# Patient Record
Sex: Female | Born: 1945 | Race: White | Hispanic: No | Smoking: Never smoker
Health system: Southern US, Community
[De-identification: ages and names within clinical notes are randomized; demographics above are authoritative.]

## PROBLEM LIST (undated history)

## (undated) DIAGNOSIS — I10 Essential (primary) hypertension: Secondary | ICD-10-CM

## (undated) DIAGNOSIS — K219 Gastro-esophageal reflux disease without esophagitis: Secondary | ICD-10-CM

## (undated) DIAGNOSIS — IMO0002 Reserved for concepts with insufficient information to code with codable children: Secondary | ICD-10-CM

## (undated) DIAGNOSIS — G4730 Sleep apnea, unspecified: Secondary | ICD-10-CM

## (undated) DIAGNOSIS — I4891 Unspecified atrial fibrillation: Secondary | ICD-10-CM

## (undated) DIAGNOSIS — I509 Heart failure, unspecified: Secondary | ICD-10-CM

## (undated) DIAGNOSIS — E119 Type 2 diabetes mellitus without complications: Secondary | ICD-10-CM

## (undated) DIAGNOSIS — N319 Neuromuscular dysfunction of bladder, unspecified: Secondary | ICD-10-CM

## (undated) DIAGNOSIS — E785 Hyperlipidemia, unspecified: Secondary | ICD-10-CM

## (undated) DIAGNOSIS — R0609 Other forms of dyspnea: Secondary | ICD-10-CM

## (undated) DIAGNOSIS — I2699 Other pulmonary embolism without acute cor pulmonale: Secondary | ICD-10-CM

## (undated) DIAGNOSIS — E669 Obesity, unspecified: Secondary | ICD-10-CM

## (undated) HISTORY — DX: Essential (primary) hypertension: I10

## (undated) HISTORY — PX: OTHER SURGICAL HISTORY: SHX169

## (undated) HISTORY — PX: TOTAL ABDOMINAL HYSTERECTOMY W/ BILATERAL SALPINGOOPHORECTOMY: SHX83

## (undated) HISTORY — DX: Gastro-esophageal reflux disease without esophagitis: K21.9

## (undated) HISTORY — DX: Other pulmonary embolism without acute cor pulmonale: I26.99

## (undated) HISTORY — DX: Type 2 diabetes mellitus without complications: E11.9

## (undated) HISTORY — DX: Obesity, unspecified: E66.9

## (undated) HISTORY — DX: Hyperlipidemia, unspecified: E78.5

## (undated) HISTORY — DX: Sleep apnea, unspecified: G47.30

## (undated) HISTORY — PX: TONSILLECTOMY: SHX5217

## (undated) HISTORY — DX: Neuromuscular dysfunction of bladder, unspecified: N31.9

## (undated) HISTORY — DX: Reserved for concepts with insufficient information to code with codable children: IMO0002

## (undated) HISTORY — DX: Unspecified atrial fibrillation: I48.91

## (undated) HISTORY — DX: Heart failure, unspecified: I50.9

## (undated) HISTORY — DX: Other forms of dyspnea: R06.09

---

## 2000-04-26 ENCOUNTER — Ambulatory Visit (HOSPITAL_BASED_OUTPATIENT_CLINIC_OR_DEPARTMENT_OTHER): Admission: RE | Admit: 2000-04-26 | Discharge: 2000-04-26 | Payer: Self-pay | Admitting: Otolaryngology

## 2000-06-02 ENCOUNTER — Other Ambulatory Visit: Admission: RE | Admit: 2000-06-02 | Discharge: 2000-06-02 | Payer: Self-pay | Admitting: Internal Medicine

## 2000-06-02 ENCOUNTER — Encounter (INDEPENDENT_AMBULATORY_CARE_PROVIDER_SITE_OTHER): Payer: Self-pay | Admitting: Specialist

## 2000-10-08 ENCOUNTER — Ambulatory Visit (HOSPITAL_COMMUNITY): Admission: RE | Admit: 2000-10-08 | Discharge: 2000-10-08 | Payer: Self-pay | Admitting: Family Medicine

## 2000-10-08 ENCOUNTER — Encounter: Payer: Self-pay | Admitting: Family Medicine

## 2001-03-04 ENCOUNTER — Ambulatory Visit (HOSPITAL_COMMUNITY): Admission: RE | Admit: 2001-03-04 | Discharge: 2001-03-04 | Payer: Self-pay | Admitting: General Surgery

## 2001-03-04 ENCOUNTER — Encounter: Payer: Self-pay | Admitting: General Surgery

## 2001-03-17 ENCOUNTER — Encounter: Payer: Self-pay | Admitting: General Surgery

## 2001-03-17 ENCOUNTER — Ambulatory Visit (HOSPITAL_COMMUNITY): Admission: RE | Admit: 2001-03-17 | Discharge: 2001-03-17 | Payer: Self-pay

## 2002-03-08 ENCOUNTER — Ambulatory Visit (HOSPITAL_COMMUNITY): Admission: RE | Admit: 2002-03-08 | Discharge: 2002-03-08 | Payer: Self-pay | Admitting: General Surgery

## 2002-03-08 ENCOUNTER — Encounter: Payer: Self-pay | Admitting: General Surgery

## 2003-03-14 ENCOUNTER — Encounter: Payer: Self-pay | Admitting: General Surgery

## 2003-03-14 ENCOUNTER — Ambulatory Visit (HOSPITAL_COMMUNITY): Admission: RE | Admit: 2003-03-14 | Discharge: 2003-03-14 | Payer: Self-pay | Admitting: General Surgery

## 2003-03-29 ENCOUNTER — Encounter: Payer: Self-pay | Admitting: General Surgery

## 2003-03-29 ENCOUNTER — Ambulatory Visit (HOSPITAL_COMMUNITY): Admission: RE | Admit: 2003-03-29 | Discharge: 2003-03-29 | Payer: Self-pay | Admitting: General Surgery

## 2004-03-15 ENCOUNTER — Ambulatory Visit (HOSPITAL_COMMUNITY): Admission: RE | Admit: 2004-03-15 | Discharge: 2004-03-15 | Payer: Self-pay | Admitting: General Surgery

## 2005-03-19 ENCOUNTER — Ambulatory Visit (HOSPITAL_COMMUNITY): Admission: RE | Admit: 2005-03-19 | Discharge: 2005-03-19 | Payer: Self-pay | Admitting: General Surgery

## 2005-07-16 ENCOUNTER — Ambulatory Visit: Payer: Self-pay | Admitting: Cardiology

## 2006-01-16 ENCOUNTER — Ambulatory Visit: Payer: Self-pay | Admitting: Internal Medicine

## 2006-01-21 ENCOUNTER — Ambulatory Visit: Payer: Self-pay | Admitting: Cardiology

## 2006-01-31 ENCOUNTER — Ambulatory Visit: Payer: Self-pay | Admitting: Cardiology

## 2006-02-11 ENCOUNTER — Ambulatory Visit: Payer: Self-pay | Admitting: Cardiology

## 2006-02-18 ENCOUNTER — Ambulatory Visit: Payer: Self-pay | Admitting: Internal Medicine

## 2006-03-25 ENCOUNTER — Ambulatory Visit (HOSPITAL_COMMUNITY): Admission: RE | Admit: 2006-03-25 | Discharge: 2006-03-25 | Payer: Self-pay | Admitting: General Surgery

## 2007-02-12 ENCOUNTER — Encounter: Admission: RE | Admit: 2007-02-12 | Discharge: 2007-03-09 | Payer: Self-pay | Admitting: Family Medicine

## 2007-03-23 ENCOUNTER — Ambulatory Visit: Payer: Self-pay | Admitting: Internal Medicine

## 2007-03-23 ENCOUNTER — Observation Stay (HOSPITAL_COMMUNITY): Admission: EM | Admit: 2007-03-23 | Discharge: 2007-03-25 | Payer: Self-pay | Admitting: Emergency Medicine

## 2007-03-31 ENCOUNTER — Ambulatory Visit: Payer: Self-pay | Admitting: Internal Medicine

## 2007-04-01 ENCOUNTER — Ambulatory Visit (HOSPITAL_COMMUNITY): Admission: RE | Admit: 2007-04-01 | Discharge: 2007-04-01 | Payer: Self-pay | Admitting: General Surgery

## 2007-04-30 ENCOUNTER — Ambulatory Visit (HOSPITAL_COMMUNITY): Admission: RE | Admit: 2007-04-30 | Discharge: 2007-05-01 | Payer: Self-pay | Admitting: General Surgery

## 2007-04-30 ENCOUNTER — Encounter (INDEPENDENT_AMBULATORY_CARE_PROVIDER_SITE_OTHER): Payer: Self-pay | Admitting: General Surgery

## 2008-04-05 ENCOUNTER — Ambulatory Visit (HOSPITAL_COMMUNITY): Admission: RE | Admit: 2008-04-05 | Discharge: 2008-04-05 | Payer: Self-pay | Admitting: General Surgery

## 2009-04-07 ENCOUNTER — Ambulatory Visit (HOSPITAL_COMMUNITY): Admission: RE | Admit: 2009-04-07 | Discharge: 2009-04-07 | Payer: Self-pay | Admitting: General Surgery

## 2010-04-09 ENCOUNTER — Ambulatory Visit (HOSPITAL_COMMUNITY): Admission: RE | Admit: 2010-04-09 | Discharge: 2010-04-09 | Payer: Self-pay | Admitting: General Surgery

## 2011-03-05 NOTE — Discharge Summary (Signed)
Kathleen [name], Kathleen [name]                 ACCOUNT NO.:  [id number]   MEDICAL RECORD NO.:  [id number]          PATIENT TYPE:  OBV   LOCATION:  6526                         FACILITY:  [facility]   PHYSICIAN:  Jonelle Sidle, MD DATE OF BIRTH:  [birth year]   DATE OF ADMISSION:  03/23/2007  DATE OF DISCHARGE:  03/25/2007                               DISCHARGE SUMMARY   PRIMARY CARDIOLOGIST:  Dr. Andee Lineman.   PRIMARY CARE PHYSICIAN:  Dr. Rudi Heap.   PROCEDURES PERFORMED DURING HOSPITALIZATION:  Abdominal ultrasound.   DISCHARGE DIAGNOSES:  1. Cholelithiasis with epigastric pain.  2. History of dysphagia.  3. Gastroesophageal reflux disease  4. Hypertension.  5. Hyperlipidemia.  6. Morbid obesity.   HISTORY OF PRESENT ILLNESS:  This is a 65 year old, morbidly obese,  Caucasian female with a history of chest pain with a negative Myoview in  April of 2007, who the morning of admission, approximately an hour and a  half after eating a bologna sandwich for breakfast, she developed 8/10  retrosternal epigastric discomfort with squeezing in the mid and mild  associated nausea, worse with deep breathing and palpitation of the  epigastrium without any associated symptoms.  After 3 hours of these  continuing symptoms, she presented to her primary care physician's  office where she was given sublingual nitroglycerin without any  improvement in symptoms.  She was given a GI cocktail and there was  complete relief.  Her symptoms were reproducible, but with palpation in  the epigastric area.  EMS was called and the patient was taken via  ambulance to [facility] Emergency Department and we were asked to see  the patient for chest pain.  She was seen and examined by Ward Givens,  nurse practitioner and Dr. Dietrich Pates.  Her EKG showed an incomplete  right bundle branch block with nonspecific T wave changes.  The  patient's vital signs were found to be stable.  Chest x-ray showed no  active disease.  EKG  showed sinus rhythm with a rate of 87 beats per  minute and normal axis.  There was T wave inversion noted in V1-V3.  The  patient was admitted to rule out myocardial infarction or cardiac  etiology for chest discomfort.   The patient's point of care markers were found to be mildly elevated  with a CK-MB elevated at 7.5, but her troponin was normal.  The  patient's cardiac enzymes were cycled throughout hospitalization and  were found to be negative with a troponin of 0.02 and 0.03 respectively.  CK was 181 and 252 respectively.  CK-MB 5.9 and 3.7 respectively.  Patient had not further complaints of chest discomfort after admission  and was noted to have abnormal liver function tests.  She was seen in  consultation by Dr. Stan Head, GI specialist.  An abdominal  ultrasound noted gallstones, but no evidence of inflammation or  obstruction.  She also had fatty lvier changes.  She was symptomatically  stable and was ultimately noted to have decreasing liver enzymes and was  able to eat.  As a result of this, Dr. Leone Payor  did speak with Dr.  Diona Browner about the possibility of an outpatient surgical evaluation for  cholecystectomy and she was referred to [facility], Dr.  Johna Sheriff, and an appointment has been made on June 9 at 3:15 for this  evaluation.  The patient was advised to stay on a low-fat diet and to be  followed up with her primary care physician postdischarge.   DISCHARGE LABS:  Sodium 134, potassium 3.8, chloride 99, CO2 25, glucose  134, BUN 10, creatinine 0.6, bilirubin total 2.0, direct bilirubin 0.9,  indirect 1.1, alkaline phosphatase 139, AST 253, ALT 435, total protein  7.3, albumin 2.9.  Cardiac enzymes negative as described.  Cholesterol  116, triglycerides 27, HDL 53, LDL 58.  TSH 0.531.  Hemoglobin 13.3,  hematocrit 38.9, white blood cell is 12.6, platelets 136.   VITAL SIGNS ON DISCHARGE:  Blood pressure 120/70, pulse 81, respirations  24, temperature  98.8, O2 saturation 94% on room air.   DISCHARGE MEDICATIONS:  1. Metoprolol 50 mg once a day.  2. Lipitor 40 mg once a day.  3. Zetia 10 mg once a day.  4. Protonix 40 mg twice a day.  5. Mavik 2 mg once a day.  6. Aspirin 325 mg once a day.   ALLERGIES:  NO KNOWN DRUG ALLERGIES.   FOLLOWUP PLANS AND APPOINTMENT:  1. The patient is to follow up with her primary care physician, Dr.      Christell Constant, for continued medical management.  The patient is to call      and make this appointment on her own accord.  2. The patient has a follow up with Dr. Johna Sheriff, [facility]      Surgery, on June 9 at 3:15 p.m. for a preoperative evaluation and      planned cholecystectomy.  3. The patient has been advised on a low-fat diet.  4. The patient will continue her current medications as taken at home      with no changes or prescriptions provided.   Time spent with the patient, to include physician time, 45 minutes.      Bettey Mare. Lyman Bishop, NP      Jonelle Sidle, MD  Electronically Signed    KML/MEDQ  D:  03/25/2007  T:  03/25/2007  Job:  [id number]   cc:   Ernestina Penna, M.D.  Lorne Skeens. Hoxworth, M.D.

## 2011-03-05 NOTE — Op Note (Signed)
[name], [name]                 ACCOUNT NO.:  [id number]   MEDICAL RECORD NO.:  [id number]          PATIENT TYPE:  OIB   LOCATION:  1539                         FACILITY:  [facility]   PHYSICIAN:  Sharlet Salina T. Hoxworth, M.D.DATE OF BIRTH:  02-11-1946   DATE OF PROCEDURE:  05/01/2007  DATE OF DISCHARGE:                               OPERATIVE REPORT   PREOPERATIVE DIAGNOSIS:  Cholelithiasis and cholecystitis.   POSTOPERATIVE DIAGNOSIS:  Cholelithiasis and cholecystitis.   PROCEDURE:  Laparoscopic cholecystectomy with intraoperative  cholangiogram.   SURGEON:  Sharlet Salina T. Hoxworth, M.D.   ASSISTANT:  Anselm Pancoast. Zachery Dakins, M.D.   ANESTHESIA:  General.   BRIEF HISTORY:  Kathleen [name] is a 65 year old female who recently  presented with an acute episode of severe epigastric and substernal  pain.  She had a cardiac workup that was negative.  Gallbladder  ultrasound has shown multiple small stones.  She had some transiently-  elevated LFTs that have returned to normal.  She was felt to have an  episode of severe biliary colic and a laparoscopic cholecystectomy with  cholangiogram has been recommend and accepted.  The nature of the  procedure, indications, risks of bleeding, infection, bile leak, and  bile duct injury have been discussed understood.  The patient is now  brought to the operating room for this procedure.   DESCRIPTION OF OPERATION:  The patient was brought to the operating room  and placed in supine position on the operating table and general  endotracheal anesthesia was induced.  She received preoperative  antibiotics.  The abdomen widely sterilely prepped and draped.  Lovenox  had been given subcutaneously preoperatively.  Correct patient and  procedure were verified.  An incision was made above the umbilicus in  the midline of 1 cm and dissection carried down to the midline fascia,  which was incised for 1 cm and the peritoneum entered under direct  vision.  Through a  mattress suture of 0 Vicryl, the Hasson trocar was  placed and pneumoperitoneum established.  Standard four-port technique  was used.  The gallbladder was visualized and the fundus grasped and  elevated up over the liver.  It appeared somewhat chronically inflamed.  The infundibulum was retracted inferolaterally.  The peritoneum anterior  and posterior to Calot's triangle was incised.  Fibrofatty tissue was  stripped off the neck of the gallbladder toward the porta hepatis.  The  cystic artery was clearly identified coursing up on the gallbladder wall  and was divided between two proximal and one distal clip.  The distal  gallbladder and Calot's triangle were further dissected and the cystic  artery identified, dissected free, clipped at the gallbladder junction.  An operative cholangiogram was then obtained through the cystic duct,  which showed good filling of a normal common bile duct and intrahepatic  ducts with free flow into the duodenum and no filling defects.  Following this the cholangiocath was removed and the cystic duct was  doubly clipped proximally and divided.  The gallbladder was then  dissected free from its bed using hook cautery and removed through the  supraumbilical  port.  The operative site was thoroughly irrigated and  hemostasis obtained.  A Surgicel pack was left as well.  Trocars then  were removed under  direct vision and all CO2 evacuated.  The mattress sutures was secured  to the umbilicus.  Skin incisions were closed with interrupted  subcuticular 4-0 Monocryl and Steri-Strips.  Sponge, needle and  instrument counts were correct.  Dry sterile dressings were applied and  the patient taken recovery in good condition.      Lorne Skeens. Hoxworth, M.D.  Electronically Signed     BTH/MEDQ  D:  04/30/2007  T:  05/01/2007  Job:  [id number]

## 2011-03-05 NOTE — Consult Note (Signed)
NAMESELDA, [name]                 ACCOUNT NO.:  [id number]   MEDICAL RECORD NO.:  [id number]          PATIENT TYPE:  OBV   LOCATION:  6526                         FACILITY:  [facility]   PHYSICIAN:  Iva Boop, MD,FACGDATE OF BIRTH:  1946/06/27   DATE OF CONSULTATION:  03/24/2007  DATE OF DISCHARGE:                                 CONSULTATION   REASON FOR CONSULTATION:  Question gallstones.   ASSESSMENT:  This is a 65 year old obese white woman who was admitted  with problems with suspected chest pain but actually and epigastric and  right upper quadrant pain.  Her transaminases and bilirubin are  elevated, as well as her alkaline phosphatase.  She has ruled out for  MI.  It certainly sounds like she could have gallbladder disease, as her  pain began after a meal.  She was having some mild her symptoms over the  past month.  A common bile duct stone is possible.  Pancreatitis seems  not to be the case, as her amylase and lipase are normal.   RECOMMENDATIONS AND PLAN:  Gallbladder ultrasound.  This has been  ordered and shows gallstones.  Common bile duct is 3.1 mm.  Gallbladder  wall is 3 mm and not thickened.  There is no evidence for cholecystitis.  She does have fatty liver.  She is clinically better, with only minimal  abdominal tenderness.   ADDITIONAL PLANS:  I have discussed this with Dr. Diona Browner, and we will  feed her, recheck her LFTs.  Her bilirubin is 4, and she is mildly  jaundiced.  Her AST is 557, and her ALT is 632.  This is atypical, but  not out of the ballpark for symptomatic cholelithiasis, though I would  be more suspicious of common duct stone and passage, based upon the  clinical scenario.  If her LFTs are declining, if she tolerates a diet,  I think she can go home, given that her bile duct is not dilated.  If  her LFTs are stable or rising, she should stay and likely see a surgeon  while she is here.   HISTORY:  As above.  This 65 year old white woman  went to [facility] complaining of chest and epigastric pain.  It  began an hour and a half after eating a bologna sandwich.  She was  tender in the right upper quadrant and epigastrium.  She was admitted.  Her LFTs were elevated, and she has ruled out for MI and is symptom free  at this time.  She has been having some milder episodes of epigastric  discomfort after eating.   PAST MEDICAL HISTORY:  1. Adenomatous colon polyps.  2. Diverticulosis.  3. Internal hemorrhoids (Dr. Marina Goodell).  4. Gastroesophageal reflux disease.  Empiric Maloney dilation because      of dysphagia to solid foods.  5. Obesity.  6. Dyslipidemia (she knows of no history of abnormal LFTs in the past,      despite being on statins).  7. Degenerative joint disease.  8. Hypertension.  9. Obstructive sleep apnea.  10.Proteinuria.  11.Prior  total abdominal hysterectomy.  12.Benign left breast biopsy  13.Tonsillectomy.   SOCIAL HISTORY:  She is married.  No children.  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.  No tobacco or alcohol.  She lives in [place].  No  children.   FAMILY HISTORY:  Father died of lung cancer and emphysema.  Mother died  of unknown cancer.   REVIEW OF SYSTEMS:  Urinary frequency, cough, knee pain.  All other  systems appear negative.   HOME MEDICATIONS:  Lipitor, Zetia, metoprolol, omeprazole, glucosamine,  moexipril.   PHYSICAL EXAMINATION:  GENERAL:  Obese, pleasant white woman, in no  acute distress.  She has a ruddy complexion.  VITAL SIGNS:  Temperature 99, blood pressure 122/47, pulse 86,  respirations 24.  HEENT:  Eyes with a hint of scleral icterus, as the palate does.  NECK:  Supple, without mass or thyromegaly.  CHEST:  Clear.  HEART:  S1, S2, no murmurs, gallops, or rubs.  ABDOMEN:  Soft, minimally tender in the epigastrium to deep palpation,  without organomegaly or mass.  It is obese.  EXTREMITIES:  There is trace nonpitting pedal edema  bilaterally in the  lower extremities.  NEUROLOGIC:  She is alert and oriented x3.  LYMPHATIC:  No neck or supraclavicular nodes.   LABORATORIES:  As mentioned above.  White count 12.6, hemoglobin 13.3.  BMET normal.  Glucose is 134, however, elevated.  Troponin is normal.  CKs 181-253, with MB 5.9-3.7.  Bilirubin is 4, albumin 2.8.  EKG:  Normal sinus rhythm, incomplete right bundle branch block, QTC is 0.486.   Ultrasound as above.  I have personally reviewed the ultrasound.   I appreciate the opportunity to care for this patient.      Iva Boop, MD,FACG  Electronically Signed     CEG/MEDQ  D:  03/24/2007  T:  03/25/2007  Job:  [id number]   cc:   Lindaann Pascal, PA-C  Jonelle Sidle, MD

## 2011-03-05 NOTE — Consult Note (Signed)
NAMEJULINA, Kathleen [name]                 ACCOUNT NO.:  [id number]   MEDICAL RECORD NO.:  [id number]          PATIENT TYPE:  INP   LOCATION:  6526                         FACILITY:  [facility]   PHYSICIAN:  [name], MD, FACCDATE OF BIRTH:  21-Sep-1946   DATE OF CONSULTATION:  03/23/2007  DATE OF DISCHARGE:                                 CONSULTATION   PRIMARY CARE PHYSICIAN:  Ernestina Penna, M.D.   PRIMARY CARDIOLOGIST:  Dr. Lewayne Bunting.   REQUESTING PHYSICIAN:  Elliot L. Effie Shy, M.D.   PATIENT PROFILE:  This is a 65 year old Caucasian female with prior  history of chest pain and negative Cardiolite April of 2007 who presents  to the emergency department with epigastric and retrosternal chest  discomfort.   PROBLEM LIST:  1. Chest and epigastric pain.  2. History of dysphagia.  3. GERD.  4. Hiatal hernia.  5. Hypertension.  6. Hyperlipidemia.  7. Morbid obesity.  8. Questionable history of sleep apnea.  9. Borderline type 2 diabetes mellitus.  10.Chronic incomplete right bundle branch block.  11.Osteoarthritis of the left knee.  12.History of diverticulitis and colon polyps.  13.Status post hysterectomy.  14.Status post tonsillectomy.  15.History of arm surgery.  16.History of left breast biopsy.  17.History of normal LV function with an EF of 55-60% with LVH by 2-D      echocardiogram in September of 2006.   HISTORY OF PRESENT ILLNESS:  This is a 65 year old, married, Caucasian  female with a history of chest pain status post negative Myoview in  April of 2007.  This morning at 9:30 a.m. approximately 1 1/2 hours  after eating a baloney sandwich for breakfast she developed 8 out of 10  retrosternal epigastric discomfort and squeezing with mild associated  nausea, worse with deep breathing and palpation of the epigastrium  without other associated symptoms.  After approximately 3 hours of  ongoing symptoms she presented to her primary care Kathleen [name]'s office  where she was  given supplemental nitroglycerin without change in  symptoms, and then subsequently given GI cocktail with near complete  relief.  Her symptoms were reproducible with palpation of the epigastric  area.  EMS was called and she was taken via ambulance to [facility]  Emergency Department.  Here she complains of some epigastric soreness  and symptoms are worsened with palpation of the epigastrium.  Her ECG  shows an incomplete right bundle branch block with nonspecific T-wave  changes.   ALLERGIES:  NO KNOWN DRUG ALLERGIES.   HOME MEDICATIONS:  1. Lipitor 40 mg daily.  2. Toprol XL 50 mg daily.  3. Omeprazole 20 mg daily.  4. Univasc 15 mg daily.  5. Zetia 10 mg daily.   FAMILY HISTORY:  Mother died of cancer at age 46.  Father died of  emphysema and cancer at age 41.  She is an only child and has no  siblings.   SOCIAL HISTORY:  She lives in [place] with her husband.  She works at  [employer] as a Librarian, academic.  She has no children.  She denies  any  tobacco, alcohol or drug use.  She does not routinely exercise.   REVIEW OF SYSTEMS:  Positive for chest pain, nausea and GERD symptoms as  outlined in the HPI.  She has left knee pain and is recently status post  steroid injections.  All other systems reviewed and negative.   PHYSICAL EXAMINATION:  VITAL SIGNS:  Temperature 99.1, heart rate 88,  respirations 19, blood pressure 110/65, pulse ox 97% on 2 liters.  GENERAL:  Pleasant, white female in no acute distress, awake, alert and  oriented x3.  NECK:  Obese.  Difficult to assess JVP.  There are no bruits.  LUNGS:  Respirations regular and unlabored.  Clear to auscultation.  CARDIAC:  Regular S1, S2.  No S3, S4 or murmurs.  ABDOMEN:  Round, soft, nontender, nondistended.  Bowel sounds present  x4.  EXTREMITIES:  Warm, dry and pink.  No clubbing or cyanosis, 1+ bilateral  lower extremity edema.  Dorsalis pedis and posterior tibial pulses 2+  and equal bilaterally.  NEURO:   Grossly intact and nonfocal.  HEENT:  Normal.   Chest x-ray shows no active disease.  EKG shows sinus rhythm at a rate  of 87 beats per minute with a normal axis and incomplete right bundle  branch block, T-wave inversion in V1 through V3.  Lab work:  Hemoglobin  15.3, hematocrit 45, sodium 136, potassium 5.1, chloride 104, CO2 29.8,  BUN 16, creatinine 0.8, glucose 140.  CK-MB 7.5, troponin I less than  0.05.   ASSESSMENT AND PLAN:  1. Chest/epigastric pain, atypical and likely gastrointestinal.      Symptoms are reproducible to palpation over the epigastrium and      were improved with GI cocktail.  On point-of-care marker her CK-MB      is elevated at 7.5, but troponin is normal.  Will check a set of CK-      MB and troponin, and if negative will plan to discharge from the      ED.  However, if enzymes are positive will plan to admit and      consider cardiac catheterization.  Would recommend increasing      proton pump inhibitor to b.i.d., as well as an outpatient      gallbladder ultrasound.  2. Hypertension, stable.  Continue beta blocker and ACE inhibitor.  3. Hyperlipidemia.  Continue statin.  Will check LFTs.  4. Obesity.  She needs nutrition counseling which can be done as an      outpatient.  She says, I have an eating problem.      [name], ANP      [name], MD, [facility]  Electronically Signed    CB/MEDQ  D:  03/23/2007  T:  03/23/2007  Job:  [id number]

## 2011-03-30 ENCOUNTER — Encounter: Payer: Self-pay | Admitting: Internal Medicine

## 2011-04-12 ENCOUNTER — Ambulatory Visit (HOSPITAL_COMMUNITY)
Admission: RE | Admit: 2011-04-12 | Discharge: 2011-04-12 | Disposition: A | Discharge status: Home / Self Care | Payer: Medicare Other | Source: Ambulatory Visit | Attending: General Surgery | Admitting: General Surgery

## 2011-04-12 ENCOUNTER — Other Ambulatory Visit (INDEPENDENT_AMBULATORY_CARE_PROVIDER_SITE_OTHER): Payer: Self-pay | Admitting: General Surgery

## 2011-04-12 DIAGNOSIS — Z139 Encounter for screening, unspecified: Secondary | ICD-10-CM

## 2011-04-12 DIAGNOSIS — Z1231 Encounter for screening mammogram for malignant neoplasm of breast: Secondary | ICD-10-CM | POA: Insufficient documentation

## 2011-05-22 DIAGNOSIS — I2699 Other pulmonary embolism without acute cor pulmonale: Secondary | ICD-10-CM

## 2011-05-22 DIAGNOSIS — Z86711 Personal history of pulmonary embolism: Secondary | ICD-10-CM | POA: Insufficient documentation

## 2011-05-22 HISTORY — DX: Other pulmonary embolism without acute cor pulmonale: I26.99

## 2011-06-18 ENCOUNTER — Emergency Department (HOSPITAL_COMMUNITY): Payer: Medicare Other

## 2011-06-18 ENCOUNTER — Inpatient Hospital Stay (HOSPITAL_COMMUNITY)
Admission: EM | Admit: 2011-06-18 | Discharge: 2011-06-23 | DRG: 175 | Disposition: A | Discharge status: Home Health | Payer: Medicare Other | Attending: Internal Medicine | Admitting: Internal Medicine

## 2011-06-18 DIAGNOSIS — K219 Gastro-esophageal reflux disease without esophagitis: Secondary | ICD-10-CM | POA: Diagnosis present

## 2011-06-18 DIAGNOSIS — G4733 Obstructive sleep apnea (adult) (pediatric): Secondary | ICD-10-CM | POA: Diagnosis present

## 2011-06-18 DIAGNOSIS — D72829 Elevated white blood cell count, unspecified: Secondary | ICD-10-CM | POA: Diagnosis present

## 2011-06-18 DIAGNOSIS — J9600 Acute respiratory failure, unspecified whether with hypoxia or hypercapnia: Secondary | ICD-10-CM | POA: Diagnosis present

## 2011-06-18 DIAGNOSIS — Z79899 Other long term (current) drug therapy: Secondary | ICD-10-CM

## 2011-06-18 DIAGNOSIS — I10 Essential (primary) hypertension: Secondary | ICD-10-CM | POA: Diagnosis present

## 2011-06-18 DIAGNOSIS — I2699 Other pulmonary embolism without acute cor pulmonale: Principal | ICD-10-CM | POA: Diagnosis present

## 2011-06-18 DIAGNOSIS — E785 Hyperlipidemia, unspecified: Secondary | ICD-10-CM | POA: Diagnosis present

## 2011-06-18 LAB — DIFFERENTIAL
Basophils Absolute: 0 10*3/uL (ref 0.0–0.1)
Basophils Relative: 0 % (ref 0–1)
Eosinophils Absolute: 0 10*3/uL (ref 0.0–0.7)
Eosinophils Relative: 0 % (ref 0–5)
Lymphocytes Relative: 12 % (ref 12–46)
Neutro Abs: 8.3 10*3/uL — ABNORMAL HIGH (ref 1.7–7.7)
Neutrophils Relative %: 73 % (ref 43–77)

## 2011-06-18 LAB — URINALYSIS, ROUTINE W REFLEX MICROSCOPIC
Ketones, ur: NEGATIVE mg/dL
Nitrite: NEGATIVE
Protein, ur: 100 mg/dL — AB
Urobilinogen, UA: 1 mg/dL (ref 0.0–1.0)
pH: 6 (ref 5.0–8.0)

## 2011-06-18 LAB — COMPREHENSIVE METABOLIC PANEL
ALT: 21 U/L (ref 0–35)
AST: 27 U/L (ref 0–37)
Albumin: 2.6 g/dL — ABNORMAL LOW (ref 3.5–5.2)
Alkaline Phosphatase: 81 U/L (ref 39–117)
Calcium: 9.2 mg/dL (ref 8.4–10.5)
Creatinine, Ser: 0.95 mg/dL (ref 0.50–1.10)
Glucose, Bld: 113 mg/dL — ABNORMAL HIGH (ref 70–99)
Sodium: 135 mEq/L (ref 135–145)
Total Protein: 7.6 g/dL (ref 6.0–8.3)

## 2011-06-18 LAB — CBC
Hemoglobin: 13.3 g/dL (ref 12.0–15.0)
MCH: 33.5 pg (ref 26.0–34.0)
Platelets: 128 10*3/uL — ABNORMAL LOW (ref 150–400)
RBC: 3.97 MIL/uL (ref 3.87–5.11)
WBC: 11.3 10*3/uL — ABNORMAL HIGH (ref 4.0–10.5)

## 2011-06-18 LAB — PRO B NATRIURETIC PEPTIDE: Pro B Natriuretic peptide (BNP): 1147 pg/mL — ABNORMAL HIGH (ref 0–125)

## 2011-06-18 LAB — CK TOTAL AND CKMB (NOT AT ARMC)
CK, MB: 6 ng/mL — ABNORMAL HIGH (ref 0.3–4.0)
Total CK: 220 U/L — ABNORMAL HIGH (ref 7–177)

## 2011-06-18 LAB — PROTIME-INR: Prothrombin Time: 14.3 seconds (ref 11.6–15.2)

## 2011-06-18 LAB — URINE MICROSCOPIC-ADD ON

## 2011-06-18 MED ORDER — IOHEXOL 300 MG/ML  SOLN
80.0000 mL | Freq: Once | INTRAMUSCULAR | Status: AC | PRN
  Administered 2011-06-18: 80 mL via INTRAVENOUS

## 2011-06-19 LAB — CBC
MCH: 33.1 pg (ref 26.0–34.0)
MCV: 95.9 fL (ref 78.0–100.0)
Platelets: 139 10*3/uL — ABNORMAL LOW (ref 150–400)
RBC: 3.93 MIL/uL (ref 3.87–5.11)
RDW: 13.7 % (ref 11.5–15.5)
WBC: 8.6 10*3/uL (ref 4.0–10.5)

## 2011-06-19 LAB — PROTIME-INR: Prothrombin Time: 14.1 seconds (ref 11.6–15.2)

## 2011-06-19 LAB — CARDIAC PANEL(CRET KIN+CKTOT+MB+TROPI)
CK, MB: 6.9 ng/mL (ref 0.3–4.0)
Total CK: 245 U/L — ABNORMAL HIGH (ref 7–177)
Troponin I: <0.3 ng/mL (ref <0.30)
Troponin I: <0.3 ng/mL (ref <0.30)

## 2011-06-19 LAB — BASIC METABOLIC PANEL
BUN: 16 mg/dL (ref 6–23)
Calcium: 9.2 mg/dL (ref 8.4–10.5)
GFR calc Af Amer: >60 mL/min (ref >60)
GFR calc non Af Amer: >60 mL/min (ref >60)
Glucose, Bld: 137 mg/dL — ABNORMAL HIGH (ref 70–99)
Potassium: 4.5 mEq/L (ref 3.5–5.1)
Sodium: 138 mEq/L (ref 135–145)

## 2011-06-19 NOTE — H&P (Signed)
NAMEMarland Kitchen  Kathleen [name], Kathleen [name] NO.:  [id number]  MEDICAL RECORD NO.:  [id number]  LOCATION:  [facility]                         FACILITY:  [facility]  PHYSICIAN:  Andreas Blower, MD       DATE OF BIRTH:  1946/06/18  DATE OF ADMISSION:  06/18/2011 DATE OF DISCHARGE:                             HISTORY & PHYSICAL   PRIMARY CARE PHYSICIAN:  [facility], Belva Agee, NP  CHIEF COMPLAINT:  Shortness of breath.  HISTORY OF PRESENT ILLNESS:  Kathleen [name] is a 65 year old Caucasian female with history of hypertension, GERD, hyperlipidemia, obesity who presents with the above complaints.  She reported that recently her husband was placed in rehab facility for Huntington disease.  Over the last 2 days, she has noted increasing shortness of breath, especially with activity.  She reported that, walking itself exhausted her. As a result initially when her primary care physician office, she was not available but saw Dr. Elmon Else, who in turn referred the patient to the ER for further evaluation.  In the ER, the patient had a CT of the chest which showed pulmonary emboli.  As a result, Hospitalist Service was asked to admit the patient for further evaluation.  The patient did complain of left-sided chest pain worse with inspiration, but since then has improved while in the emergency department.  Denies any cough.  Does complain about shortness of breath.  Denies any abdominal pain, diarrhea.  Denies any recent fevers or chills.  Denies any nausea or vomiting.  REVIEW OF SYSTEMS:  All systems were reviewed with the patient and positive as per HPI; otherwise, all other systems are negative.  PAST MEDICAL HISTORY: 1. Hypertension. 2. GERD. 3. Hyperlipidemia. 4. Morbid obesity. 5. History of sleep apnea.  SOCIAL HISTORY:  The patient denies smoking, does not drink any alcohol, and has been using a cane at home.  FAMILY HISTORY:  The patient is adopted.  Reported that her  foster parents, her father had lung cancer and her foster mother, she is unsure what the cancer her foster mother had.  HOME MEDICATIONS:  To be accurately reconciled by Pharmacy.  PHYSICAL EXAMINATION:  VITAL SIGNS:  Temperature 98.1, blood pressure 101/76, heart rate 83, respirations initially was 31, was satting at 20 while I was in the room; satting at 95% on 3 liters of oxygen. GENERAL:  The patient was alert and oriented, did not appear to be in any acute distress, but even minimal activity exhaust her. HEENT:  Extraocular motions are intact.  Pupils equal, round.  Had moist mucous membranes. NECK:  Supple. HEART:  Regular with S1 and S2. LUNGS:  Clear to auscultation bilaterally. ABDOMEN:  Soft, nontender, nondistended.  Positive bowel sounds. EXTREMITIES:  The patient with good peripheral pulses with trace edema. NEURO:  Cranial nerves II through XII grossly intact.  At 5/5 motor strength in upper as well as lower extremities.  RADIOLOGY/IMAGING:  The patient had chest x-ray two-view which showed cardiomyopathy with central vascular congestion, bibasilar atelectasis, and small left effusion.  The patient had CT of the chest with contrast which showed studies positive for pulmonary emboli.  Image quality is fair but is  convincing evidence for filling defect in the right lower lobe pulmonary arteries. There may be additional filling defect in the left lower lobe. Compression fracture involving T4 with vertebral body of unknown age.  LABORATORY DATA:  CBC shows a white count of 11.3, hemoglobin 13.3, hematocrit 37.5, platelet count 128.  D-dimer 5.43.  Electrolytes normal with a BUN of 22, creatinine 0.95.  Liver function tests normal except the albumin is 2.6.  BNP is 1147.  EKG shows RBBB no prior EKGs for comparision.  ASSESSMENT AND PLAN: 1. Acute hypoxic respiratory failure, likely due to pulmonary     embolism. 2. Pulmonary embolism.  The patient was started on  heparin and     Coumadin.  The patient is currently on heparin drip which will be     transitioned to Lovenox depending on the patient's clinical course     in the morning.  The patient will require at least a 5-day bridge     with heparin products. We will trend the patient's troponins to     rule out for acute coronary syndrome, that will likely low     likelihood given the patient has a PE that is causing her to be     short of breath.  Suspect the patient's pleuritic chest pain is     likely due to her pulmonary embolism. 3. Hypertension.  Continue home medications, withhold parameters. 4. Gastroesophageal reflux disease.  Continue the patient on her home     PPI. 5. Hyperlipidemia.  Continue statin and Zetia. 6. Morbid obesity.  Further management as outpatient. 7. Sleep apnea.  Further management as outpatient. 8. Leukocytosis, likely reactive leukocytosis and monitor for now. 9. T4 vertebral body compression fracture age unknown and the     patient does not appear to be having any symptoms. Conservative     management for now. 10.Prophylaxis, on heparin drip. 11.Code status.  The patient is full code.  This was discussed with     the patient at the time of admission.  TIME SPENT:  Time spent on admission, talking to the patient, and coordinating care was 50 minutes.   Andreas Blower, MD   SR/MEDQ  D:  06/18/2011  T:  06/18/2011  Job:  [id number]  Electronically Signed by Wardell Heath REDDY  on 06/19/2011 12:14:36 AM

## 2011-06-20 LAB — CBC
Hemoglobin: 12.9 g/dL (ref 12.0–15.0)
MCH: 32.7 pg (ref 26.0–34.0)
MCHC: 33.6 g/dL (ref 30.0–36.0)
MCV: 97.2 fL (ref 78.0–100.0)
RBC: 3.95 MIL/uL (ref 3.87–5.11)

## 2011-06-20 LAB — BASIC METABOLIC PANEL
BUN: 17 mg/dL (ref 6–23)
CO2: 28 mEq/L (ref 19–32)
Chloride: 97 mEq/L (ref 96–112)
Creatinine, Ser: 0.71 mg/dL (ref 0.50–1.10)
Glucose, Bld: 105 mg/dL — ABNORMAL HIGH (ref 70–99)
Potassium: 4.8 mEq/L (ref 3.5–5.1)

## 2011-06-21 LAB — BASIC METABOLIC PANEL
BUN: 13 mg/dL (ref 6–23)
CO2: 29 mEq/L (ref 19–32)
Calcium: 9 mg/dL (ref 8.4–10.5)
Chloride: 99 mEq/L (ref 96–112)
Creatinine, Ser: 0.67 mg/dL (ref 0.50–1.10)
GFR calc Af Amer: >60 mL/min (ref >60)
GFR calc non Af Amer: >60 mL/min (ref >60)
Glucose, Bld: 112 mg/dL — ABNORMAL HIGH (ref 70–99)
Potassium: 5.1 mEq/L (ref 3.5–5.1)
Sodium: 135 mEq/L (ref 135–145)

## 2011-06-21 LAB — CBC
HCT: 39.4 % (ref 36.0–46.0)
Hemoglobin: 13.6 g/dL (ref 12.0–15.0)
MCH: 32.8 pg (ref 26.0–34.0)
MCHC: 34.5 g/dL (ref 30.0–36.0)
MCV: 94.9 fL (ref 78.0–100.0)
Platelets: 158 10*3/uL (ref 150–400)
RBC: 4.15 MIL/uL (ref 3.87–5.11)
RDW: 13.2 % (ref 11.5–15.5)
WBC: 6.2 10*3/uL (ref 4.0–10.5)

## 2011-06-22 LAB — BASIC METABOLIC PANEL
BUN: 13 mg/dL (ref 6–23)
CO2: 34 mEq/L — ABNORMAL HIGH (ref 19–32)
Calcium: 9.1 mg/dL (ref 8.4–10.5)
Glucose, Bld: 115 mg/dL — ABNORMAL HIGH (ref 70–99)
Sodium: 136 mEq/L (ref 135–145)

## 2011-06-22 LAB — CBC
HCT: 40.6 % (ref 36.0–46.0)
MCH: 32.9 pg (ref 26.0–34.0)
MCV: 96.7 fL (ref 78.0–100.0)
RDW: 13.1 % (ref 11.5–15.5)
WBC: 6.6 10*3/uL (ref 4.0–10.5)

## 2011-06-23 LAB — PROTIME-INR
INR: 2.06 — ABNORMAL HIGH (ref 0.00–1.49)
Prothrombin Time: 23.6 seconds — ABNORMAL HIGH (ref 11.6–15.2)

## 2011-06-23 LAB — CBC
Hemoglobin: 13.6 g/dL (ref 12.0–15.0)
MCH: 32.2 pg (ref 26.0–34.0)
MCHC: 33.7 g/dL (ref 30.0–36.0)
MCV: 95.7 fL (ref 78.0–100.0)
Platelets: 200 10*3/uL (ref 150–400)
RBC: 4.22 MIL/uL (ref 3.87–5.11)
RDW: 12.8 % (ref 11.5–15.5)

## 2011-06-26 NOTE — Discharge Summary (Signed)
NAMEKENLI, Kathleen [name]                 ACCOUNT NO.:  [id number]  MEDICAL RECORD NO.:  [id number]  LOCATION:  4707                         FACILITY:  [facility]  PHYSICIAN:  Ladell Pier, M.D.   DATE OF BIRTH:  14-Mar-1946  DATE OF ADMISSION:  06/18/2011 DATE OF DISCHARGE:  06/23/2011                              DISCHARGE SUMMARY   DISCHARGE DIAGNOSES: 1. Acute hypoxic respiratory failure secondary to pulmonary embolism. 2. Pulmonary embolism. 3. Hypertension. 4. Gastroesophageal reflux disease. 5. Hyperlipidemia. 6. Morbid obesity. 7. Sleep apnea. 8. Leukocytosis. 9. Compression fracture T4 asymptomatic.  DISCHARGE MEDICATIONS: 1. Hydrocodone 5/325 every 4 hours as needed. 2. Warfarin 6 mg tonight.  [facility] will check [facility]/INR. INR     will be managed per [facility].  We will     give further instructions with management of her Coumadin. 3. Atorvastatin 40 mg daily. 4. Metoprolol XL 50 mg daily. 5. Moexipril 15 mg daily. 6. Omeprazole 20 mg daily. 7. Vitamin D3 2000 units daily. 8. Zetia 10 mg daily.  FOLLOWUP APPOINTMENTS:  The patient is to follow up with her PCP/Suzanne Weeks, nurse practitioner within 1 week.  PROCEDURES:  CT angiogram of the chest, positive for PE, the image quality is fair but there is convincing evidence of filling defects in the right lower lobe pulmonary arteries.  There may be additional filling defects in the left lower lobe.  There are basilar parenchymal densities which could represent atelectasis, but cannot exclude infarct, compression fractures including T4 vertebral body of unknown age.  This is new since 2008.  Chest x-ray, cardiomegaly with central vascular congestion, bibasilar atelectasis, and small effusion.  CONSULTANTS:  None.  HISTORY OF PRESENT ILLNESS:  The patient is a 65 year old Caucasian female with history of hypertension, GERD, hyperlipidemia, obesity, presents to the hospital reporting  husband was placed in rehab facility for Huntington disease, over the last 2 days she has noticed increasing shortness of breath especially with activity, reported walking she felt exhausted her as a result initially when her primary care physician office at [facility], Dr. Ileana Ladd who in turn referred the patient to the ER for further evaluation in the ER.  The patient had a CT of the chest which showed PE as resolved.  Hospital service was called to admit the patient.  PAST MEDICAL HISTORY, FAMILY HISTORY, SOCIAL HISTORY, MEDS, ALLERGIES, REVIEW OF SYSTEMS:  Per admission H and P.  PHYSICAL EXAMINATION:  VITAL SIGNS:  At time of discharge, temperature 97.3, pulse of 83, respirations 20, blood pressure 122/76, and pulse of 90% on 2 liters. GENERAL:  The patient is sitting up on the side of the bed, well- nourished, obese white female. HEENT:  Normocephalic and atraumatic.  Pupils are reactive to light. Her throat is without erythema. CARDIOVASCULAR:  Regular rate and rhythm. LUNGS:  Clear bilaterally.  Positive bowel sounds.  Obese. EXTREMITIES: Trace edema bilaterally.  HOSPITAL COURSE: 1. Pulmonary embolism.  The patient was admitted to the hospital,     placed on Lovenox and Coumadin.  The patient did not feel     comfortable administering her Lovenox shots at home but she stated  in the hospital took dose for 6 days while her INR became     therapeutic.  Her INR is now therapeutic and she had greater than 5     days of Lovenox, so she will be discharged on Coumadin.  Her oxygen     level dropped to 80s whenever she ambulates without oxygen, was to     be discharged on continuous 2 liters of home oxygen. 2. Hypertension, continue her on home medication regimen. 3. Gastroesophageal reflux disease, she was she received PPI. 4. Obstructive sleep apnea.  The patient does not wear CPAP at this     time.  And that has remained stable. 5. Obesity.  I encouraged diet and  exercise.  DISCHARGE LABS:  PT 23.6, INR 2.06, WBC 7.6, hemoglobin 13.6, MCV 95.7, platelets 200.  Sodium 136, potassium 5.1, chloride 96, CO2 of 34, glucose 115, BUN 13, creatinine 0.6, calcium 9.1.  Time spent with the patient and doing this discharge is approximately 40 minutes.     Ladell Pier, M.D.     [place]/MEDQ  D:  06/23/2011  T:  06/23/2011  Job:  [id number]  Electronically Signed by Ladell Pier M.D. on 06/26/2011 10:31:01 PM

## 2011-08-06 LAB — URINALYSIS, ROUTINE W REFLEX MICROSCOPIC
Bilirubin Urine: NEGATIVE
Ketones, ur: NEGATIVE
Leukocytes, UA: NEGATIVE
Nitrite: NEGATIVE
Protein, ur: 30 — AB
Urobilinogen, UA: 0.2
pH: 7

## 2011-08-06 LAB — COMPREHENSIVE METABOLIC PANEL
ALT: 31
AST: 31
CO2: 31
Calcium: 9.4
Creatinine, Ser: 0.67
GFR calc Af Amer: >60
GFR calc non Af Amer: >60
Sodium: 141
Total Protein: 7.9

## 2011-08-06 LAB — CBC
MCHC: 34.7
MCV: 95.8
Platelets: 178
RDW: 13.9

## 2011-08-06 LAB — DIFFERENTIAL
Basophils Absolute: 0
Basophils Relative: 0
Eosinophils Absolute: 0.2
Eosinophils Relative: 2
Lymphocytes Relative: 25
Lymphs Abs: 2.3
Monocytes Absolute: 0.8 — ABNORMAL HIGH
Monocytes Relative: 8
Neutro Abs: 6
Neutrophils Relative %: 64

## 2011-08-06 LAB — LIPASE, BLOOD: Lipase: 24

## 2011-08-08 LAB — DIFFERENTIAL
Basophils Absolute: 0
Basophils Relative: 0
Monocytes Absolute: 0.1 — ABNORMAL LOW
Neutro Abs: 8.6 — ABNORMAL HIGH
Neutrophils Relative %: 94 — ABNORMAL HIGH

## 2011-08-08 LAB — CBC
MCHC: 33.6
MCV: 95.6
Platelets: 142 — ABNORMAL LOW
RBC: 4.06
RDW: 13.5
WBC: 12.6 — ABNORMAL HIGH

## 2011-08-08 LAB — CARDIAC PANEL(CRET KIN+CKTOT+MB+TROPI)
CK, MB: 3.7
Relative Index: 1.5
Total CK: 252 — ABNORMAL HIGH
Troponin I: 0.03

## 2011-08-08 LAB — BASIC METABOLIC PANEL
CO2: 28
Calcium: 8.7
Creatinine, Ser: 0.57
GFR calc Af Amer: >60
GFR calc non Af Amer: >60
Glucose, Bld: 130 — ABNORMAL HIGH

## 2011-08-08 LAB — POCT CARDIAC MARKERS
CKMB, poc: 7.5
Myoglobin, poc: 146
Operator id: 151321
Troponin i, poc: <0.05

## 2011-08-08 LAB — HEPATIC FUNCTION PANEL
ALT: 485 — ABNORMAL HIGH
Alkaline Phosphatase: 127 — ABNORMAL HIGH
Bilirubin, Direct: 0.9 — ABNORMAL HIGH
Bilirubin, Direct: 2 — ABNORMAL HIGH
Indirect Bilirubin: 1.1 — ABNORMAL HIGH
Indirect Bilirubin: 1.1 — ABNORMAL HIGH

## 2011-08-08 LAB — PROTIME-INR
INR: 1
Prothrombin Time: 12.9

## 2011-08-08 LAB — COMPREHENSIVE METABOLIC PANEL
ALT: 632 — ABNORMAL HIGH
Alkaline Phosphatase: 126 — ABNORMAL HIGH
Glucose, Bld: 134 — ABNORMAL HIGH
Potassium: 3.8
Sodium: 134 — ABNORMAL LOW
Total Protein: 7

## 2011-08-08 LAB — I-STAT 8, (EC8 V) (CONVERTED LAB)
Bicarbonate: 29.8 — ABNORMAL HIGH
Glucose, Bld: 140 — ABNORMAL HIGH
TCO2: 32
pH, Ven: 7.306 — ABNORMAL HIGH

## 2011-08-08 LAB — HEPARIN LEVEL (UNFRACTIONATED): Heparin Unfractionated: 0.68

## 2011-08-08 LAB — TSH: TSH: 0.531

## 2011-08-08 LAB — POCT I-STAT CREATININE: Creatinine, Ser: 0.8

## 2011-08-08 LAB — CK TOTAL AND CKMB (NOT AT ARMC): Relative Index: 3.3 — ABNORMAL HIGH

## 2011-08-08 LAB — LIPID PANEL
HDL: 53
Total CHOL/HDL Ratio: 2.2

## 2011-10-03 LAB — PROTIME-INR

## 2011-10-17 LAB — PROTIME-INR

## 2011-10-30 LAB — PROTIME-INR

## 2011-11-06 LAB — PROTIME-INR

## 2012-01-10 ENCOUNTER — Telehealth: Payer: Self-pay | Admitting: *Deleted

## 2012-01-10 NOTE — Telephone Encounter (Signed)
In attempts to establish patient,mutliple attempts were made and messages left for Debbi and Tammy at [facility] for clarification of diagnosis. Unable to ever speak directly to staff, I spoke with patient in efforts to complete this referral. Patient states she needs a work up to determine why she developed clots in her lungs. States she is adopted and has no information on biological parents to help determine this so Helene Kelp, PA and Henrene Pastor, pharmacist, would like Heme to evaluate for cause and recommendations on length of need for coagulation. Informed patient we would get her established and call her back with an appt. She request for at least the week after next, due to other issues she has ongoing with home repairs.

## 2012-01-13 ENCOUNTER — Telehealth: Payer: Self-pay | Admitting: *Deleted

## 2012-01-13 NOTE — Telephone Encounter (Signed)
patient confirmed over the phone on 01-22-2012 starting at 10:15am with fcounseling

## 2012-01-16 ENCOUNTER — Telehealth: Payer: Self-pay | Admitting: Oncology

## 2012-01-16 NOTE — Telephone Encounter (Signed)
Referred by Dr. Christell Constant Dx-PE

## 2012-01-20 ENCOUNTER — Encounter: Payer: Self-pay | Admitting: Oncology

## 2012-01-20 DIAGNOSIS — K219 Gastro-esophageal reflux disease without esophagitis: Secondary | ICD-10-CM | POA: Insufficient documentation

## 2012-01-20 DIAGNOSIS — I10 Essential (primary) hypertension: Secondary | ICD-10-CM | POA: Insufficient documentation

## 2012-01-20 DIAGNOSIS — E785 Hyperlipidemia, unspecified: Secondary | ICD-10-CM | POA: Insufficient documentation

## 2012-01-20 DIAGNOSIS — E669 Obesity, unspecified: Secondary | ICD-10-CM | POA: Insufficient documentation

## 2012-01-22 ENCOUNTER — Other Ambulatory Visit: Payer: Medicare Other | Admitting: Lab

## 2012-01-22 ENCOUNTER — Other Ambulatory Visit: Payer: Self-pay | Admitting: Oncology

## 2012-01-22 ENCOUNTER — Encounter: Payer: Self-pay | Admitting: Oncology

## 2012-01-22 ENCOUNTER — Telehealth: Payer: Self-pay | Admitting: Oncology

## 2012-01-22 ENCOUNTER — Ambulatory Visit (HOSPITAL_BASED_OUTPATIENT_CLINIC_OR_DEPARTMENT_OTHER): Payer: Medicare Other | Admitting: Oncology

## 2012-01-22 ENCOUNTER — Ambulatory Visit: Payer: Medicare Other

## 2012-01-22 ENCOUNTER — Ambulatory Visit: Payer: Medicare Other | Admitting: Lab

## 2012-01-22 VITALS — BP 125/71 | HR 98 | Temp 95.7°F | Ht 63.5 in | Wt 319.3 lb

## 2012-01-22 DIAGNOSIS — I2699 Other pulmonary embolism without acute cor pulmonale: Secondary | ICD-10-CM

## 2012-01-22 DIAGNOSIS — E669 Obesity, unspecified: Secondary | ICD-10-CM

## 2012-01-22 DIAGNOSIS — E785 Hyperlipidemia, unspecified: Secondary | ICD-10-CM

## 2012-01-22 DIAGNOSIS — R0600 Dyspnea, unspecified: Secondary | ICD-10-CM

## 2012-01-22 DIAGNOSIS — R0609 Other forms of dyspnea: Secondary | ICD-10-CM

## 2012-01-22 DIAGNOSIS — K219 Gastro-esophageal reflux disease without esophagitis: Secondary | ICD-10-CM

## 2012-01-22 DIAGNOSIS — Z7901 Long term (current) use of anticoagulants: Secondary | ICD-10-CM

## 2012-01-22 DIAGNOSIS — I10 Essential (primary) hypertension: Secondary | ICD-10-CM

## 2012-01-22 DIAGNOSIS — G4700 Insomnia, unspecified: Secondary | ICD-10-CM

## 2012-01-22 DIAGNOSIS — R319 Hematuria, unspecified: Secondary | ICD-10-CM | POA: Insufficient documentation

## 2012-01-22 HISTORY — DX: Dyspnea, unspecified: R06.00

## 2012-01-22 HISTORY — DX: Other forms of dyspnea: R06.09

## 2012-01-22 LAB — CBC WITH DIFFERENTIAL/PLATELET
BASO%: 0.5 % (ref 0.0–2.0)
EOS%: 2.5 % (ref 0.0–7.0)
HCT: 41.6 % (ref 34.8–46.6)
LYMPH%: 20.1 % (ref 14.0–49.7)
MCH: 32.1 pg (ref 25.1–34.0)
MCHC: 33.8 g/dL (ref 31.5–36.0)
NEUT%: 65.6 % (ref 38.4–76.8)
lymph#: 1.7 10*3/uL (ref 0.9–3.3)

## 2012-01-22 LAB — URINALYSIS, MICROSCOPIC - CHCC
Bilirubin (Urine): NEGATIVE
Glucose: NEGATIVE g/dL
Specific Gravity, Urine: 1.01 (ref 1.003–1.035)

## 2012-01-22 LAB — PROTIME-INR: INR: 3 (ref 2.00–3.50)

## 2012-01-22 NOTE — Patient Instructions (Signed)
1.  History of pulmonary embolism (PE:  Clot in the blood vessels of the lung): - Work up:  Blood test to see if there's explanation for why you developed clot in the first place. - CT abdomen to make sure there is no kidney mass to cause you to have blood in the urine a few months ago. - Urine testing today to make sure that you do not have any blood in urine.  If you still have blood in your urine, I will refer you to a urologist.  - CT chest to see what happened to your blood clot in the lung.  2.  Problem sleeping at night:  I cannot see how this is related to your blood clot.  You have sleep apnea.  You need to see your family doctor to get CPAP machine.  This may help you breath better.  3.  Problem with feeling short of breath on exertion:  Common due to history of clot.  Even if clot is resolved, patients may have persistent SOB due to pulmonary hypertension.  If CT chest showed that your clot has resolved, and you still have breathing problem, you will need to see a Pulmonologist (a lung specialist).   4.  Follow up: - Continue Coumadin for now. - I will see you back in about 2 wks to go over all the work up today and decide what to do with your Coumadin.

## 2012-01-22 NOTE — Progress Notes (Signed)
[facility] INITIAL HEMATOLOGY CONSULTATION  Referral MD:  Mr. Isaac Laud. Caryn Bee, [place] Dr. Ernestina Penna, M.D.  Reason for Referral: pulmonary embolism.     HPI:  Mrs. Rahrig is a 66 year-old woman with history of obesity, HTN, hyperlipidemia.  She is not very active at baseline.  She was in usual state of health when she developed sudden onset SOB, DOE in August 2012.  She denied trauma, more immobile than baseline the few weeks prior to this event.  CT angio was performed on 06/18/2011 which showed both right and left lower lobe pulmonary embolism.  On admission, her vital sign was noted on admission H&P with blood pressure 101/76, heart rate 83, respirations initially was 31, satting at 95% on 3 liters of oxygen.  She was placed on Lovenox, O2 [place] and started on Coumadin and was discharged to home with follow up with PCP.  Her INR has been in therapeutic range.  She was kindly referred to the [facility] for evaluation due to this first event of PE.    Mrs. Waage presented to the clinic for the first time today by herself.  She reported that she no longer has chest pain or DOE like in August 2012.  She does not need 24 hour O2 [place] like before.  However, on exertion such as climbing steps or long ambulation, she still fees short winded.  She denied CP, palpitation, syncope, pedal edema.  She also complained of insomnia within the last few months.  Her husband passed away in 2012-10-26from Huntington's disease; but she denied depression, feeling hopelessness.  She also had about 1 week of frank hematuria 3 months ago.  She does not remember having work up for this.  She was told that its' due to Coumadin.  Without close monitoring of her INR, she has not had hematuria since then.   Patient denies headache, visual changes, confusion, drenching night sweats, palpable lymph node swelling, mucositis, odynophagia, dysphagia, nausea vomiting, jaundice, chest pain, palpitation, shortness of  breath, dyspnea on exertion, productive cough, gum bleeding, epistaxis, hematemesis, hemoptysis, abdominal pain, abdominal swelling, early satiety, melena, hematochezia, skin rash, spontaneous bleeding, joint swelling, joint pain, heat or cold intolerance, bowel bladder incontinence, back pain, focal motor weakness, paresthesia, depression, suicidal or homocidal ideation, feeling hopelessness.  She reports feeling cold at night specially in her extremities.       Past Medical History  Diagnosis Date  . HTN (hypertension)   . Hyperlipemia   . GERD (gastroesophageal reflux disease)   . Obesity   . Sleep apnea     she has not been on CPAP  . PE (pulmonary embolism) 05/2011  . DOE (dyspnea on exertion) 01/22/2012  . Hematuria 01/22/2012  :    Past Surgical History  Procedure Date  . Total abdominal hysterectomy w/ bilateral salpingoophorectomy   :   CURRENT MEDS: Current Outpatient Prescriptions  Medication Sig Dispense Refill  . atorvastatin (LIPITOR) 40 MG tablet Take 40 mg by mouth daily.      . cholecalciferol (VITAMIN D) 1000 UNITS tablet Take 4,000 Units by mouth daily.      Marland Kitchen ezetimibe (ZETIA) 10 MG tablet Take 10 mg by mouth daily.      . metoprolol succinate (TOPROL-XL) 50 MG 24 hr tablet Take 50 mg by mouth daily. Take with or immediately following a meal.      . moexipril (UNIVASC) 15 MG tablet Take 15 mg by mouth at bedtime.      Marland Kitchen  omeprazole (PRILOSEC) 20 MG capsule Take 20 mg by mouth daily.      Marland Kitchen warfarin (COUMADIN) 5 MG tablet Take 5 mg by mouth daily. Except 7.5mg  Tuesdays and Thursdays only      . diazepam (VALIUM) 5 MG tablet Take 5 mg by mouth Once daily as needed.          Allergies no known allergies:  Family History  Problem Relation Age of Onset  . Adopted: Yes  :  History   Social History  . Marital Status: Married    Spouse Name: N/A    Number of Children: 0  . Years of Education: N/A   Occupational History  .      retired Education officer, environmental    Social History Main Topics  . Smoking status: Never Smoker   . Smokeless tobacco: Never Used  . Alcohol Use: No  . Drug Use: No  . Sexually Active: No   Other Topics Concern  . Not on file   Social History Narrative  . No narrative on file  :  REVIEW OF SYSTEM:  The rest of the 14-point review of sytem was negative.   Exam:   General:  Obese woman in no acute distress.  Eyes:  no scleral icterus.  ENT:  There were no oropharyngeal lesions.  Neck was without thyromegaly.  Lymphatics:  Negative cervical, supraclavicular or axillary adenopathy.  Respiratory: lungs were clear bilaterally without wheezing or crackles.  Cardiovascular:  Regular rate and rhythm, S1/S2, without murmur, rub or gallop.  There was no pedal edema.  GI:  abdomen was soft, flat, nontender, nondistended, without organomegaly.  Muscoloskeletal:  no spinal tenderness of palpation of vertebral spine.  Skin exam was without echymosis, petichae.  Neuro exam was nonfocal.  Patient needed assistance to get on and off exam table due to her obesity.  Gait was normal.  Patient wa alerted and oriented.  Attention was good.   Language was appropriate.  Mood was normal without depression.  Speech was not pressured.  Thought content was not tangential.    LABS:  Lab Results  Component Value Date   WBC 8.4 01/22/2012   HGB 14.1 01/22/2012   HCT 41.6 01/22/2012   PLT 186 01/22/2012   GLUCOSE 115* 06/22/2011   CHOL  Value: 116        ATP III CLASSIFICATION:  <200     mg/dL   Desirable  130-865  mg/dL   Borderline High  >=784    mg/dL   High 03/29/6294   TRIG 27 03/24/2007   HDL 53 03/24/2007   LDLCALC  Value: 58        Total Cholesterol/HDL:CHD Risk Coronary Heart Disease Risk Table                     Men   Women  1/2 Average Risk   3.4   3.3 03/24/2007   ALT 21 06/18/2011   AST 27 06/18/2011   NA 136 06/22/2011   K 5.1 06/22/2011   CL 96 06/22/2011   CREATININE 0.66 06/22/2011   BUN 13 06/22/2011   CO2 34* 06/22/2011   INR 3.00 01/22/2012       ASSESSMENT AND PLAN:    1.  History of pulmonary embolism, bilateral right and left subsegmental:  - Work up:  thrombophilia work up was sent today.  She is up to date with her yearly mammogram.  Her last colonoscopy with Dr. Marina Goodell ([facility]) was 5  years ago which showed some benign polyps.  She was told that she is due for a repeat colonoscopy this year.  She had history of hematuria; thus, I will pursue to work up to rule out occult malignancy.  This was her first nonprovoked PE.  She had tachypnea and hypoxia on presentation, but her thrombus burden was subsegmental.  I sent for thrombophilia work up and I will discuss with her next visit about the duration of anticoagulation.   - Continue Coumadin with INR check with PCP Coumadin clinic for now.   2.  Insominia:    I cannot see how this is related to her PE.  She does have history of PE, but she is not using a CPAP.  I recommended to her to talk with her PCP to see if CPAP may be considered.   3.  Dyspnea on exertion:  - Differential:  Residual blood clot vs cardiac causes given her cardiac risk factors (obesity, HTN, HLP) vs pulmonary hypertension from history of PE.   - Work up:  I sent for repeat CT angio and 2D echo.  4.  Hematuria:  While on Coumadin 3 months ago.  - Work up:  I sent for CT abdomen/pelvis to rule out for occult malignancy.  I also send for UA today.  If she has positive UA now or in the future, I may consider referring her to Urology.   5.  Hypertension:  Well controlled. Continue with Univasc and Toprol-XL per PCP.   6.  Hyperlipidemia:  She is on Lipitor and Zetia per PCP.   7.  GERD:  She is on Prilosec per PCP.   8.  Follow up: - Continue Coumadin for now. - I will see her back in about 2 wks to go over all the work up.   The length of time of the face-to-face encounter was 45 minutes. More than 50% of time was spent counseling and coordination of care.   Thank you for this referral.

## 2012-01-22 NOTE — Telephone Encounter (Signed)
Gv pt appt for april2013.  scheduled pt for ct scan on 04/11 @ [facility]

## 2012-01-24 ENCOUNTER — Telehealth: Payer: Self-pay | Admitting: Oncology

## 2012-01-24 ENCOUNTER — Other Ambulatory Visit: Payer: Self-pay | Admitting: Oncology

## 2012-01-24 NOTE — Telephone Encounter (Signed)
called pt and provided echo appt for 04/11 @ 10am @ [facility]. pt did not want echo @ [address]

## 2012-01-27 ENCOUNTER — Other Ambulatory Visit: Payer: Self-pay | Admitting: *Deleted

## 2012-01-27 ENCOUNTER — Telehealth: Payer: Self-pay | Admitting: *Deleted

## 2012-01-27 LAB — LUPUS ANTICOAGULANT PANEL
DRVVT: 53.9 secs — ABNORMAL HIGH (ref <45.1)
Lupus Anticoagulant: NOT DETECTED

## 2012-01-27 LAB — FACTOR 5 LEIDEN

## 2012-01-27 LAB — COMPREHENSIVE METABOLIC PANEL
AST: 31 U/L (ref 0–37)
Alkaline Phosphatase: 92 U/L (ref 39–117)
BUN: 19 mg/dL (ref 6–23)
Creatinine, Ser: 0.65 mg/dL (ref 0.50–1.10)
Potassium: 4.3 mEq/L (ref 3.5–5.3)
Total Bilirubin: 0.4 mg/dL (ref 0.3–1.2)

## 2012-01-27 LAB — PROTHROMBIN GENE MUTATION

## 2012-01-27 NOTE — Telephone Encounter (Signed)
Called pt back to confirm she is clear about her schedule and understands her Echo has been r/s.  Pt verbalized understanding, appreciative.

## 2012-01-27 NOTE — Telephone Encounter (Signed)
S/w pt today re appts for 4/11. Per cameo called pt re echo - somehow echo appt was cx'd. S/w Kamilla @ [facility] to put echo back on and s/w pt this morning to confirm appts for 4/11 @ [facility] echo @ 10 am and ct @ 11:30 am.

## 2012-01-27 NOTE — Telephone Encounter (Signed)
3 messages left by pt asking about her Echo appt.   Called pt and she says Echo was scheduled at [facility] for 4/11 at 10 am but now someone has called her and said it was canceled.  Pt states no idea why it was canceled or even who called her about it?  She asks if it can be rescheduled?  Called Dory Peru in Scheduling and asked her to r/s Echo for pt if possible.

## 2012-01-30 ENCOUNTER — Other Ambulatory Visit: Payer: Self-pay | Admitting: Oncology

## 2012-01-30 ENCOUNTER — Ambulatory Visit (HOSPITAL_COMMUNITY)
Admission: RE | Admit: 2012-01-30 | Discharge: 2012-01-30 | Disposition: A | Discharge status: Home / Self Care | Payer: Medicare Other | Source: Ambulatory Visit | Attending: Oncology | Admitting: Oncology

## 2012-01-30 ENCOUNTER — Other Ambulatory Visit: Payer: PRIVATE HEALTH INSURANCE

## 2012-01-30 ENCOUNTER — Other Ambulatory Visit (HOSPITAL_COMMUNITY): Payer: PRIVATE HEALTH INSURANCE

## 2012-01-30 ENCOUNTER — Ambulatory Visit (HOSPITAL_COMMUNITY): Payer: PRIVATE HEALTH INSURANCE

## 2012-01-30 DIAGNOSIS — I10 Essential (primary) hypertension: Secondary | ICD-10-CM

## 2012-01-30 DIAGNOSIS — K219 Gastro-esophageal reflux disease without esophagitis: Secondary | ICD-10-CM

## 2012-01-30 DIAGNOSIS — R319 Hematuria, unspecified: Secondary | ICD-10-CM | POA: Insufficient documentation

## 2012-01-30 DIAGNOSIS — I2699 Other pulmonary embolism without acute cor pulmonale: Secondary | ICD-10-CM

## 2012-01-30 DIAGNOSIS — Z7901 Long term (current) use of anticoagulants: Secondary | ICD-10-CM | POA: Insufficient documentation

## 2012-01-30 DIAGNOSIS — E785 Hyperlipidemia, unspecified: Secondary | ICD-10-CM

## 2012-01-30 DIAGNOSIS — N200 Calculus of kidney: Secondary | ICD-10-CM | POA: Insufficient documentation

## 2012-01-30 DIAGNOSIS — R0609 Other forms of dyspnea: Secondary | ICD-10-CM

## 2012-01-30 DIAGNOSIS — R609 Edema, unspecified: Secondary | ICD-10-CM | POA: Insufficient documentation

## 2012-01-30 DIAGNOSIS — Z90710 Acquired absence of both cervix and uterus: Secondary | ICD-10-CM | POA: Insufficient documentation

## 2012-01-30 DIAGNOSIS — Z86718 Personal history of other venous thrombosis and embolism: Secondary | ICD-10-CM | POA: Insufficient documentation

## 2012-01-30 DIAGNOSIS — M799 Soft tissue disorder, unspecified: Secondary | ICD-10-CM | POA: Insufficient documentation

## 2012-01-30 DIAGNOSIS — E669 Obesity, unspecified: Secondary | ICD-10-CM

## 2012-01-30 MED ORDER — IOHEXOL 300 MG/ML  SOLN
125.0000 mL | Freq: Once | INTRAMUSCULAR | Status: AC | PRN
  Administered 2012-01-30: 125 mL via INTRAVENOUS

## 2012-01-30 NOTE — Progress Notes (Signed)
  Echocardiogram 2D Echocardiogram has been performed.  Cathie Beams Deneen 01/30/2012, 11:18 AM

## 2012-01-31 NOTE — Patient Instructions (Addendum)
A.  Result of CT chest:  Findings: There is no axillary lymphadenopathy. Thyroid gland is  diffusely large similar to prior. Inferior to the left of the  thyroid gland there is an ill-defined soft tissue measuring 13 x 19  mm (image 18). This is not seen on prior exam. No mediastinal  lymphadenopathy. No pericardial effusion fluid.  Examination of pulmonary arteries somewhat limited due to  respiratory motion. There is likely some residual wall adherent  clot in the right main pulmonary artery and potentially in the  right lower lobe (image 57). No clear evidence of acute pulmonary  embolism.  Esophagus is normal. No pericardial fluid or pneumonia. There is  mild interlobular septal thickening and small effusions.  Limited view of the upper abdomen is unremarkable. Limited view of  the skeleton demonstrates degenerative osteophytosis.  IMPRESSION:  1. No evidence of acute pulmonary embolism. Some wall adherent  clot in the right main pulmonary artery right lower lobe pulmonary  arteries is mostly consistent with residual of chronic P E.  2. Mild interstitial edema.  3. Soft tissue nodule inferior to the left of the thyroid gland  could represent a lymph node.   B.  Result of CT abdomen:  Did not show any mass or cancer to explain why you had the blood in the urine last year.   Findings: There is no focal hepatic lesion. Prior cholecystectomy.  The pancreas, spleen, adrenal glands, and kidneys are normal.  There is a small nonobstructing 2 mm calculus within the right  kidney (image 30).  The stomach, small bowel, and cecum are normal. There are  diverticula of the sigmoid colon. No evidence of acute  inflammation.  Abdominal aorta is normal caliber. No retroperitoneal or  periportal lymphadenopathy. No peritoneal disease.  No free fluid the pelvis. Post hysterectomy anatomy. The bladder  is collapsed. No pelvic lymphadenopathy. Review of bone windows  demonstrates no aggressive  osseous lesions.   IMPRESSION:  1. No evidence of malignancy within the abdomen or pelvis.  2. Small nonobstructing right renal calculus.  C.  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  These protein deficiencies predispose you to recurrent clot. - I recommend life long Coumadin (to decrease the risk of recurrent blood clot).  - Continue Coumadin with INR check with your family doctor.    D. Echocardiogram was normal.  There was no evidence of congestive heart failure.  - What to do for your Shortness of Breath:  Could be due to history of blood clot in the lungs causing pulmonary hypertension and shortness of breath. - You may consider talking with your family doctor to make sure you do not have emphysema or needing a stress test.

## 2012-02-03 ENCOUNTER — Ambulatory Visit (HOSPITAL_BASED_OUTPATIENT_CLINIC_OR_DEPARTMENT_OTHER): Payer: Medicare Other | Admitting: Oncology

## 2012-02-03 VITALS — BP 134/68 | HR 101 | Temp 97.3°F | Ht 63.5 in | Wt 333.1 lb

## 2012-02-03 DIAGNOSIS — I2699 Other pulmonary embolism without acute cor pulmonale: Secondary | ICD-10-CM

## 2012-02-03 DIAGNOSIS — I2789 Other specified pulmonary heart diseases: Secondary | ICD-10-CM

## 2012-02-03 DIAGNOSIS — R0609 Other forms of dyspnea: Secondary | ICD-10-CM

## 2012-02-03 NOTE — Progress Notes (Signed)
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  CURRENT THERAPY:  On Coumadin with goal INR 2-3.   Recommended duration of therapy:  Lifelong.   INTERVAL HISTORY: Kathleen [name] 66 y.o. female returns to go over her work up.  She still has DOE walking more than 148ft.  At home, walking from room to room, she does not have DOE and does not require O2 via [place].  She denied chest pain, SOB like when she was first diagnosed with PE.  She has occasional nose bleed with the dry warm central heat.  Her hematuria from last year has completely resolved without any residual problem.  She has chronic fatigue; however, she is still independent of activities of daily living.   Patient denies headache, visual changes, confusion, drenching night sweats, palpable lymph node swelling, mucositis, odynophagia, dysphagia, nausea vomiting, jaundice, chest pain, palpitation, gum bleeding, hematemesis, hemoptysis, abdominal pain, abdominal swelling, early satiety, melena, hematochezia, hematuria, skin rash, spontaneous bleeding, joint swelling, joint pain, heat or cold intolerance, bowel bladder incontinence, back pain, focal motor weakness, paresthesia, depression, suicidal or homocidal ideation, feeling hopelessness.   Past Medical History  Diagnosis Date  . HTN (hypertension)   . Hyperlipemia   . GERD (gastroesophageal reflux disease)   . Obesity   . Sleep apnea     she has not been on CPAP  . PE (pulmonary embolism) 05/2011  . DOE (dyspnea on exertion) 01/22/2012  . Hematuria 01/22/2012    Past Surgical History  Procedure Date  . Total abdominal hysterectomy w/ bilateral salpingoophorectomy     Current Outpatient Prescriptions  Medication Sig Dispense Refill  . atorvastatin (LIPITOR) 40 MG tablet Take 40 mg by mouth daily.       . cetirizine (ZYRTEC) 10 MG tablet Take 10 mg by mouth daily.      . cholecalciferol (VITAMIN D) 1000 UNITS tablet Take 4,000 Units by mouth daily.      . diazepam (VALIUM) 5 MG tablet Take 5 mg by mouth Once daily as needed.      . ezetimibe (ZETIA) 10 MG tablet Take 10 mg by mouth daily.      . metoprolol succinate (TOPROL-XL) 50 MG 24 hr tablet Take 50 mg by mouth daily. Take with or immediately following a meal.      . moexipril (UNIVASC) 15 MG tablet Take 15 mg by mouth at bedtime.      Marland Kitchen omeprazole (PRILOSEC) 20 MG capsule Take 20 mg by mouth daily.      Marland Kitchen PROAIR HFA 108 (90 BASE) MCG/ACT inhaler Use as directed      . warfarin (COUMADIN) 5 MG tablet Take 5 mg by mouth daily. Except 7.5mg  Tuesdays and Thursdays only      . nystatin cream (MYCOSTATIN) As needed        ALLERGIES:   has no known allergies.  REVIEW OF SYSTEMS:  The rest of the 14-point review of system was negative.   Filed Vitals:   02/03/12 1502  BP: 134/68  Pulse: 101  Temp: 97.3 F (36.3 C)   Wt Readings from Last 3 Encounters:  02/03/12 333 lb 1.6 oz (151.093 kg)  01/22/12 319 lb 4.8 oz (144.834 kg)   ECOG Performance status: 1 due to SOB.   PHYSICAL EXAMINATION: none today due to no new complaint.  LABORATORY/RADIOLOGY DATA:  Lab Results  Component Value Date   WBC 8.4 01/22/2012   HGB 14.1 01/22/2012   HCT 41.6 01/22/2012   PLT 186 01/22/2012   GLUCOSE 134* 01/22/2012   CHOL  Value: 116        ATP III CLASSIFICATION:  <200     mg/dL   Desirable  161-096  mg/dL   Borderline High  >=045    mg/dL   High 4/0/9811   TRIG 27 03/24/2007   HDL 53 03/24/2007   LDLCALC  Value: 58        Total Cholesterol/HDL:CHD Risk Coronary Heart Disease Risk Table                     Men   Women  1/2 Average Risk   3.4   3.3 03/24/2007   ALKPHOS 92 01/22/2012   ALT 39* 01/22/2012   AST 31 01/22/2012   NA 137 01/22/2012   K 4.3 01/22/2012   CL 102 01/22/2012   CREATININE 0.65 01/22/2012   BUN 19 01/22/2012   CO2 25 01/22/2012   INR 3.00  01/22/2012   IMAGING:  I personally reviewed the following CT's and showed the report to the patient.   Ct Angio Chest W/cm &/or Wo Cm  01/30/2012  *RADIOLOGY REPORT*  Clinical Data: History pulmonary embolism.  On Coumadin.  CT ANGIOGRAPHY CHEST  Technique:  Multidetector CT imaging of the chest using the standard protocol during bolus administration of intravenous contrast. Multiplanar reconstructed images including MIPs were obtained and reviewed to evaluate the vascular anatomy.  Contrast: OMNIPAQUE IOHEXOL 300 MG/ML  SOLN  Comparison: At left and the CT 06/10/2011  Findings: There is no axillary lymphadenopathy.  Thyroid gland is diffusely large similar to prior.  Inferior to the left of the thyroid gland there is an ill-defined soft tissue measuring 13 x 19 mm (image 18).  This is not seen on prior exam. No mediastinal lymphadenopathy.  No pericardial effusion fluid.  Examination of pulmonary arteries somewhat limited due to respiratory motion.  There is likely some residual wall adherent clot in the right main pulmonary artery and potentially in the right lower lobe (image 57).  No clear evidence of acute pulmonary embolism.  Esophagus is normal.  No pericardial fluid or pneumonia.  There is mild interlobular septal thickening and small effusions.  Limited view of the upper abdomen is unremarkable. Limited view of the skeleton demonstrates degenerative osteophytosis.  IMPRESSION:  1.  No evidence of acute pulmonary embolism.  Some wall adherent clot in the right main pulmonary artery right lower lobe pulmonary arteries is mostly consistent with  residual of chronic P E. 2.  Mild interstitial edema. 3.  Soft tissue nodule inferior to the left of the thyroid gland could represent a lymph node.  Original Report Authenticated By: Genevive Bi, M.D.   Ct Abdomen Pelvis W Contrast  01/30/2012  *RADIOLOGY REPORT*  Clinical Data: Hematuria.  Patient on Coumadin  CT ABDOMEN AND PELVIS WITH CONTRAST   Technique:  Multidetector CT imaging of the abdomen and pelvis was performed following the standard protocol during bolus administration of intravenous contrast.  Contrast: OMNIPAQUE IOHEXOL 300 MG/ML  SOLN  Comparison: None.  Findings: There is no focal hepatic lesion.  Prior cholecystectomy. The pancreas, spleen, adrenal glands, and kidneys are normal. There is a small nonobstructing 2 mm calculus within the right kidney (image 30).  The stomach, small bowel, and cecum are normal.  There are  diverticula of  the sigmoid colon.  No evidence of acute inflammation.  Abdominal aorta is normal caliber.  No retroperitoneal or periportal lymphadenopathy.  No peritoneal disease.  No free fluid the pelvis.  Post hysterectomy anatomy.  The bladder is collapsed.  No pelvic lymphadenopathy. Review of  bone windows demonstrates no aggressive osseous lesions.  IMPRESSION:  1.  No evidence of malignancy within the abdomen or pelvis. 2.  Small nonobstructing right renal calculus.  Original Report Authenticated By: Genevive Bi, M.D.     ASSESSMENT AND PLAN:   1.  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  The rest of thrombophilia work up was negative.   - I recommend life long Coumadin since she has bilateral symptomatic PE's.  Regardless of the result of the rest of thrombophilia work up (protein C and S), I still have concern that she may have a recurrent episode of DVT/PE off of anticoagulation. - I advised Coumadin with INR 2-3.  I do not endorse the new anticoagulants such as rivaroxaban due to lack of antidotes.  - She has good rapport with her PCP's Coumadin clinic and will follow up there. - I educated her on the need to check her INR if she takes antibiotics to decrease the risk of supratherapeutic INR. - I advised her to have a stable diet of leafy vegetable to stabilize her INR.    2.  Dyspnea  on exertion: - Differential diagnosis:  Pulmonary hypertension due to past bilateral PE vs cardiac in origin vs deconditioning due to morbid obesity.  Her echo was negative for valvular disease or congestive heart failure.  There was no acute PE on the last CT angio.   - Recommend to PCP to see whether cardiac stress test is appropriate or referral to pulmonary if her DOE persists or worsens.   3.  History of hematuria:   - CT abdomen negative for malignancy. - If recurrent, she may need Urology evaluation.    5. Hypertension: Well controlled. Continue with Univasc and Toprol-XL per PCP.  6. Hyperlipidemia: She is on Lipitor and Zetia per PCP.  7. GERD: She is on Prilosec per PCP.  8. Disposition:  D/c from Cancer Center.  Our service is available in the future if the need arises.  Thank you for this referral.       The length of time of the face-to-face encounter was 15 minutes. More than 50% of time was spent counseling and coordination of care.

## 2012-03-04 ENCOUNTER — Other Ambulatory Visit (INDEPENDENT_AMBULATORY_CARE_PROVIDER_SITE_OTHER): Payer: Self-pay | Admitting: General Surgery

## 2012-03-04 DIAGNOSIS — Z139 Encounter for screening, unspecified: Secondary | ICD-10-CM

## 2012-03-23 ENCOUNTER — Encounter (HOSPITAL_COMMUNITY): Payer: Self-pay | Admitting: *Deleted

## 2012-03-23 ENCOUNTER — Emergency Department (HOSPITAL_COMMUNITY)
Admission: EM | Admit: 2012-03-23 | Discharge: 2012-03-23 | Disposition: A | Discharge status: Home / Self Care | Payer: Medicare Other | Attending: Emergency Medicine | Admitting: Emergency Medicine

## 2012-03-23 DIAGNOSIS — G4730 Sleep apnea, unspecified: Secondary | ICD-10-CM | POA: Insufficient documentation

## 2012-03-23 DIAGNOSIS — I878 Other specified disorders of veins: Secondary | ICD-10-CM

## 2012-03-23 DIAGNOSIS — E785 Hyperlipidemia, unspecified: Secondary | ICD-10-CM | POA: Insufficient documentation

## 2012-03-23 DIAGNOSIS — R600 Localized edema: Secondary | ICD-10-CM

## 2012-03-23 DIAGNOSIS — R609 Edema, unspecified: Secondary | ICD-10-CM | POA: Insufficient documentation

## 2012-03-23 DIAGNOSIS — B354 Tinea corporis: Secondary | ICD-10-CM | POA: Insufficient documentation

## 2012-03-23 DIAGNOSIS — K219 Gastro-esophageal reflux disease without esophagitis: Secondary | ICD-10-CM | POA: Insufficient documentation

## 2012-03-23 DIAGNOSIS — Z79899 Other long term (current) drug therapy: Secondary | ICD-10-CM | POA: Insufficient documentation

## 2012-03-23 DIAGNOSIS — I10 Essential (primary) hypertension: Secondary | ICD-10-CM | POA: Insufficient documentation

## 2012-03-23 DIAGNOSIS — I872 Venous insufficiency (chronic) (peripheral): Secondary | ICD-10-CM | POA: Insufficient documentation

## 2012-03-23 MED ORDER — SULFAMETHOXAZOLE-TMP DS 800-160 MG PO TABS
1.0000 | ORAL_TABLET | Freq: Once | ORAL | Status: AC
  Administered 2012-03-23: 1 via ORAL
  Filled 2012-03-23: qty 1

## 2012-03-23 MED ORDER — NYSTATIN 100000 UNIT/GM EX CREA: TOPICAL_CREAM | CUTANEOUS | Status: DC

## 2012-03-23 MED ORDER — CLINDAMYCIN HCL 150 MG PO CAPS: 150.0000 mg | ORAL_CAPSULE | Freq: Four times a day (QID) | ORAL | Status: AC

## 2012-03-23 NOTE — ED Notes (Signed)
Spoke with Wound care nurse Lawson Fiscal and informed of consult request.

## 2012-03-23 NOTE — ED Notes (Signed)
ZOX:[id number]<AV> Expected date:03/23/12<BR> Expected time:10:01 AM<BR> Means of arrival:Ambulance<BR> Comments:<BR> 61yoF,wound care, hx of venostasis

## 2012-03-23 NOTE — ED Notes (Signed)
Both feet and lower legs wrapped with vaseline gauze on blisters, gauze and ace wrap. Well tolerated.

## 2012-03-23 NOTE — ED Notes (Signed)
Both feet bleeding, having blisters.

## 2012-03-23 NOTE — ED Provider Notes (Signed)
History     CSN: [id number]  Arrival date & time 03/23/12  1010   First MD Initiated Contact with Patient 03/23/12 1012      No chief complaint on file.   (Consider location/radiation/quality/duration/timing/severity/associated sxs/prior treatment) HPI  66 year old female with history of PE, on Coumadin, and history of venostasis presents complaining of wounds on feets.  Patient states she has been having increased swelling to both of her feet for the past several months. She has been seen and evaluated by her primary care Dr. and has been having her feet  Wrapped to help decrease swelling. Today, she was seen in the office for regular visit, when her doctor notice blisters to the dorsums of feet.  The blisters has popped and blood were noted.   He recommended for her to come to the ER for further evaluation.  Pt denies fever, cp, sob, lightheadedness or increased numbness.  Pt lives at home by herself, and does not use any assistance to ambulate. She does have home health advance wound care service for the first time yesterday. Pt reports since her feet has been wrapped, she has trouble putting on her shoes due to increase swelling.  Sts blisters comes from rubbing against shoe.  Her last INR was 2.3 last week.  Hx of pre-diabetes.  Pt also has tinea corporis to the fold of her abdomen and currently being treated with ketoconazole.      Past Medical History  Diagnosis Date  . HTN (hypertension)   . Hyperlipemia   . GERD (gastroesophageal reflux disease)   . Obesity   . Sleep apnea     she has not been on CPAP  . PE (pulmonary embolism) 05/2011  . DOE (dyspnea on exertion) 01/22/2012  . Hematuria 01/22/2012    Past Surgical History  Procedure Date  . Total abdominal hysterectomy w/ bilateral salpingoophorectomy     Family History  Problem Relation Age of Onset  . Adopted: Yes    History  Substance Use Topics  . Smoking status: Never Smoker   . Smokeless tobacco: Never Used  .  Alcohol Use: No    OB History    Grav Para Term Preterm Abortions TAB SAB Ect Mult Living                  Review of Systems  All other systems reviewed and are negative.    Allergies  Review of patient's allergies indicates no known allergies.  Home Medications   Current Outpatient Rx  Name Route Sig Dispense Refill  . ATORVASTATIN CALCIUM 40 MG PO TABS Oral Take 40 mg by mouth daily.    Marland Kitchen CETIRIZINE HCL 10 MG PO TABS Oral Take 10 mg by mouth daily.    Marland Kitchen VITAMIN D 1000 UNITS PO TABS Oral Take 4,000 Units by mouth daily.    Marland Kitchen DIAZEPAM 5 MG PO TABS Oral Take 5 mg by mouth Once daily as needed.    Marland Kitchen EZETIMIBE 10 MG PO TABS Oral Take 10 mg by mouth daily.    Marland Kitchen METOPROLOL SUCCINATE ER 50 MG PO TB24 Oral Take 50 mg by mouth daily. Take with or immediately following a meal.    . MOEXIPRIL HCL 15 MG PO TABS Oral Take 15 mg by mouth at bedtime.    . NYSTATIN 100000 UNIT/GM EX CREA  As needed    . OMEPRAZOLE 20 MG PO CPDR Oral Take 20 mg by mouth daily.    Marland Kitchen PROAIR HFA 108 (90  BASE) MCG/ACT IN AERS  Use as directed    . WARFARIN SODIUM 5 MG PO TABS Oral Take 5 mg by mouth daily. Except 7.5mg  Tuesdays and Thursdays only      There were no vitals taken for this visit.  Physical Exam  Nursing note and vitals reviewed. Constitutional: She appears well-developed and well-nourished. No distress.  HENT:  Head: Atraumatic.  Mouth/Throat: Oropharynx is clear and moist.  Eyes: Conjunctivae are normal.  Neck: Neck supple.  Cardiovascular: Normal rate and regular rhythm.   Pulmonary/Chest: Effort normal. No respiratory distress. She has no wheezes.  Musculoskeletal: She exhibits edema.  Neurological: She is alert.  Skin: Skin is warm. No rash noted.       2+ nonpitting edema noted to both lower extremities bilaterally. Significant dependent edema noted to dorsums of both feet without any signs of infection. Left foot significant for a (14cm diagonal) skin lesion to dorsum of foot from a  popped blister, with skin desquamation.  Granular tissue noted in lesion with minimal bleeding.  Non petechial, non pustular.  Both feet is malodorous but no obvious signs of infection.  Skin fissure noted between 4th-5th toe.  Not actively bleeding  Difficult to palpate pedal pulses but brisk cap refills to all toes.    Erythematous large skin lesion noted to intertrigo of L abdominal folds near L groin with foul odor and 2nd degree skin breakdown.  Non pustular, non petechial  4cm oval cellular erythema noted to dorsum of L mid thigh.  Non indurated, non fluctuance.     ED Course  Procedures (including critical care time)  Labs Reviewed - No data to display No results found.   No diagnosis found.    MDM  Pt is here for wound evaluation with and long term wound care as pt is on coumadin and has bleeding wound to her L foot.  No obvious signs of infection.  Appearance does not support leg ischemia.  Pt in NAD.  Feet were initially wrap.  Pt was taken Keflex for presumed cellulitis for 6 days.  Pt also has yeast infection to the folds of her abd and groin, currently being treated with ketoconazole.   I discussed with my attending who has seen and evaluate pt.  Plan to have Case Management involve to help provide long term wound care for pt at home.  Pt is currently afebrile with stable vs.  We both agree that pt needs long term management.     11:59 AM  I have spoken with case manager Clydie Braun, who agrees to see Pt in ED to help set up further care outpatient.   Resumption of Home Health Service recommended.    12:43 PM Pt has been seen by Child psychotherapist.  Pt qualifies to have Home Health nursing care for long term management of her lower extremity swelling and skin break down.  Post op boot ordered.  Care instruction given.  Pt to f/u wit PCP for further management.  Pt is on Warfarin, will prescribe clindamycin for skin infection..  Pt able to ambulate.  Low suspicion for DVT at this  time.   Fayrene Helper, PA-C 03/23/12 1320

## 2012-03-23 NOTE — Discharge Instructions (Signed)
Please take CLindamycin for skin infection.  Keep your legs elevated above your heart at night.  You are scheduled to have nursing care at home for your wound.  Please follow up with your doctor for further management.  Continue applying nystatin cream to the affected area for treatment of yeast infection.  Return if you are developing fever, increasing pain, persistent diarrhea, or if you have any other concerns.    Edema Edema is an abnormal build-up of fluids in tissues. Because this is partly dependent on gravity (water flows to the lowest place), it is more common in the leg sand thighs (lower extremities). It is also common in the looser tissues, like around the eyes. Painless swelling of the feet and ankles is common and increases as a person ages. It may affect both legs and may include the calves or even thighs. When squeezed, the fluid may move out of the affected area and may leave a dent for a few moments. CAUSES   Prolonged standing or sitting in one place for extended periods of time. Movement helps pump tissue fluid into the veins, and absence of movement prevents this, resulting in edema.   Varicose veins. The valves in the veins do not work as well as they should. This causes fluid to leak into the tissues.   Fluid and salt overload.   Injury, burn, or surgery to the leg, ankle, or foot, may damage veins and allow fluid to leak out.   Sunburn damages vessels. Leaky vessels allow fluid to go out into the sunburned tissues.   Allergies (from insect bites or stings, medications or chemicals) cause swelling by allowing vessels to become leaky.   Protein in the blood helps keep fluid in your vessels. Low protein, as in malnutrition, allows fluid to leak out.   Hormonal changes, including pregnancy and menstruation, cause fluid retention. This fluid may leak out of vessels and cause edema.   Medications that cause fluid retention. Examples are sex hormones, blood pressure  medications, steroid treatment, or anti-depressants.   Some illnesses cause edema, especially heart failure, kidney disease, or liver disease.   Surgery that cuts veins or lymph nodes, such as surgery done for the heart or for breast cancer, may result in edema.  DIAGNOSIS  Your caregiver is usually easily able to determine what is causing your swelling (edema) by simply asking what is wrong (getting a history) and examining you (doing a physical). Sometimes x-rays, EKG (electrocardiogram or heart tracing), and blood work may be done to evaluate for underlying medical illness. TREATMENT  General treatment includes:  Leg elevation (or elevation of the affected body part).   Restriction of fluid intake.   Prevention of fluid overload.   Compression of the affected body part. Compression with elastic bandages or support stockings squeezes the tissues, preventing fluid from entering and forcing it back into the blood vessels.   Diuretics (also called water pills or fluid pills) pull fluid out of your body in the form of increased urination. These are effective in reducing the swelling, but can have side effects and must be used only under your caregiver's supervision. Diuretics are appropriate only for some types of edema.  The specific treatment can be directed at any underlying causes discovered. Heart, liver, or kidney disease should be treated appropriately. HOME CARE INSTRUCTIONS   Elevate the legs (or affected body part) above the level of the heart, while lying down.   Avoid sitting or standing still for prolonged periods of  time.   Avoid putting anything directly under the knees when lying down, and do not wear constricting clothing or garters on the upper legs.   Exercising the legs causes the fluid to work back into the veins and lymphatic channels. This may help the swelling go down.   The pressure applied by elastic bandages or support stockings can help reduce ankle swelling.     A low-salt diet may help reduce fluid retention and decrease the ankle swelling.   Take any medications exactly as prescribed.  SEEK MEDICAL CARE IF:  Your edema is not responding to recommended treatments. SEEK IMMEDIATE MEDICAL CARE IF:   You develop shortness of breath or chest pain.   You cannot breathe when you lay down; or if, while lying down, you have to get up and go to the window to get your breath.   You are having increasing swelling without relief from treatment.   You develop a fever over 102 F (38.9 C).   You develop pain or redness in the areas that are swollen.   Tell your caregiver right away if you have gained 3 lb/1.4 kg in 1 day or 5 lb/2.3 kg in a week.  MAKE SURE YOU:   Understand these instructions.   Will watch your condition.   Will get help right away if you are not doing well or get worse.  Document Released: 10/07/2005 Document Revised: 09/26/2011 Document Reviewed: 05/25/2008 [facility] Patient Information 2012 [place], [place].  Ringworm, Body [Tinea Corporis] Ringworm is a fungal infection of the skin and hair. Another name for this problem is Tinea Corporis. It has nothing to do with worms. A fungus is an organism that lives on dead cells (the outer layer of skin). It can involve the entire body. It can spread from infected pets. Tinea corporis can be a problem in wrestlers who may get the infection form other players/opponents, equipment and mats. DIAGNOSIS  A skin scraping can be obtained from the affected area and by looking for fungus under the microscope. This is called a KOH examination.  HOME CARE INSTRUCTIONS   Ringworm may be treated with a topical antifungal cream, ointment, or oral medications.   If you are using a cream or ointment, wash infected skin. Dry it completely before application.   Scrub the skin with a buff puff or abrasive sponge using a shampoo with ketoconazole to remove dead skin and help treat the ringworm.   Have  your pet treated by your veterinarian if it has the same infection.  SEEK MEDICAL CARE IF:   Your ringworm patch (fungus) continues to spread after 7 days of treatment.   Your rash is not gone in 4 weeks. Fungal infections are slow to respond to treatment. Some redness (erythema) may remain for several weeks after the fungus is gone.   The area becomes red, warm, tender, and swollen beyond the patch. This may be a secondary bacterial (germ) infection.   You have a fever.  Document Released: 10/04/2000 Document Revised: 09/26/2011 Document Reviewed: 03/17/2009 [facility] Patient Information 2012 [place], [place].

## 2012-03-23 NOTE — Progress Notes (Signed)
Pt able to get her medications from a local pharmacy that delivers

## 2012-03-23 NOTE — ED Notes (Signed)
Ortho tech called and notified.

## 2012-03-23 NOTE — Progress Notes (Signed)
ED CM Contacted by ED PA, RN and [place] to assist with home health PA confirms pt presently active with [facility] Pt confirms [facility]) RN visited on March 22, 2012 and next scheduled visit is on March 24, 2012.  CM discussed medicare guidelines for admission and d/c disposition options with pt.  Mrs Baksh reports wanting to remain in her home versus facility placement due to cost.  CM reviewed the differences in home health, assisted living, private duty nursing services and skilled nursing services including how each service is  paid either via medicare, medicaid or private pay. She voice understand  Pt reports she lives alone, is able to walk in home, get her own meals, complete 50-75% of ADLs and assistance from her pastor Renaldo Fiddler). Cm spoke with Darl Pikes of [facility] about services. CM assisted CNA with getting pt from bedside commode to bed (2 + assistance) CM spoke with PA about orders for home services and surgical shoes.

## 2012-03-24 NOTE — Progress Notes (Signed)
ED CM returned call to pt after receiving a voice message from her at on 03/24/12. Pt states she called after she was "frustated with home health nurse" Reports a different nurse came to visit but did not have equipment to change dressing HH RN told pt she had no specific orders and would have pcp to assist with wound care center referral in [place].  Pt agrees to wound care center services in [place] Pt states she is calmer now and will await further services from [facility] staff and pcp

## 2012-03-26 NOTE — ED Provider Notes (Signed)
Medical screening examination/treatment/procedure(s) were conducted as a shared visit with non-physician practitioner(s) and myself.  I personally evaluated the patient during the encounter,  65yf with wounds to b/l LE consistent with venous stasis and of and around panus consistent with intertrigo. Small area L posterior thigh indurated so will cover for possible early cellulitis. Biggest concern is on going wound care. Case management consult to facilitate this. Return precautions discussed.  Raeford Razor, MD 03/26/12 [phone]

## 2012-04-13 ENCOUNTER — Ambulatory Visit (HOSPITAL_COMMUNITY)
Admission: RE | Admit: 2012-04-13 | Discharge: 2012-04-13 | Disposition: A | Discharge status: Home / Self Care | Payer: Medicare Other | Source: Ambulatory Visit | Attending: General Surgery | Admitting: General Surgery

## 2012-04-13 DIAGNOSIS — Z139 Encounter for screening, unspecified: Secondary | ICD-10-CM

## 2012-04-13 DIAGNOSIS — Z1231 Encounter for screening mammogram for malignant neoplasm of breast: Secondary | ICD-10-CM | POA: Insufficient documentation

## 2012-04-21 ENCOUNTER — Other Ambulatory Visit (INDEPENDENT_AMBULATORY_CARE_PROVIDER_SITE_OTHER): Payer: Self-pay | Admitting: General Surgery

## 2012-04-21 DIAGNOSIS — R928 Other abnormal and inconclusive findings on diagnostic imaging of breast: Secondary | ICD-10-CM

## 2012-05-06 ENCOUNTER — Ambulatory Visit (HOSPITAL_COMMUNITY): Payer: Medicare Other

## 2012-07-16 ENCOUNTER — Encounter: Payer: Self-pay | Admitting: Internal Medicine

## 2012-07-27 ENCOUNTER — Other Ambulatory Visit: Payer: PRIVATE HEALTH INSURANCE | Admitting: Lab

## 2012-07-27 ENCOUNTER — Ambulatory Visit: Payer: PRIVATE HEALTH INSURANCE | Admitting: Oncology

## 2012-11-02 ENCOUNTER — Encounter (HOSPITAL_COMMUNITY): Payer: Self-pay | Admitting: Emergency Medicine

## 2012-11-02 ENCOUNTER — Inpatient Hospital Stay (HOSPITAL_COMMUNITY): Payer: Medicare Other

## 2012-11-02 ENCOUNTER — Emergency Department (HOSPITAL_COMMUNITY): Payer: Medicare Other

## 2012-11-02 ENCOUNTER — Inpatient Hospital Stay (HOSPITAL_COMMUNITY)
Admission: EM | Admit: 2012-11-02 | Discharge: 2012-11-09 | DRG: 871 | Disposition: A | Discharge status: Skilled Nursing Facility | Payer: Medicare Other | Attending: Internal Medicine | Admitting: Internal Medicine

## 2012-11-02 DIAGNOSIS — T148XXA Other injury of unspecified body region, initial encounter: Secondary | ICD-10-CM

## 2012-11-02 DIAGNOSIS — B49 Unspecified mycosis: Secondary | ICD-10-CM

## 2012-11-02 DIAGNOSIS — E785 Hyperlipidemia, unspecified: Secondary | ICD-10-CM

## 2012-11-02 DIAGNOSIS — Z6841 Body Mass Index (BMI) 40.0 and over, adult: Secondary | ICD-10-CM

## 2012-11-02 DIAGNOSIS — F411 Generalized anxiety disorder: Secondary | ICD-10-CM | POA: Diagnosis present

## 2012-11-02 DIAGNOSIS — L0291 Cutaneous abscess, unspecified: Secondary | ICD-10-CM

## 2012-11-02 DIAGNOSIS — Z7901 Long term (current) use of anticoagulants: Secondary | ICD-10-CM

## 2012-11-02 DIAGNOSIS — G4733 Obstructive sleep apnea (adult) (pediatric): Secondary | ICD-10-CM

## 2012-11-02 DIAGNOSIS — E669 Obesity, unspecified: Secondary | ICD-10-CM

## 2012-11-02 DIAGNOSIS — J9620 Acute and chronic respiratory failure, unspecified whether with hypoxia or hypercapnia: Secondary | ICD-10-CM

## 2012-11-02 DIAGNOSIS — I10 Essential (primary) hypertension: Secondary | ICD-10-CM

## 2012-11-02 DIAGNOSIS — Z86711 Personal history of pulmonary embolism: Secondary | ICD-10-CM

## 2012-11-02 DIAGNOSIS — I959 Hypotension, unspecified: Secondary | ICD-10-CM

## 2012-11-02 DIAGNOSIS — B379 Candidiasis, unspecified: Secondary | ICD-10-CM

## 2012-11-02 DIAGNOSIS — Z79899 Other long term (current) drug therapy: Secondary | ICD-10-CM

## 2012-11-02 DIAGNOSIS — A419 Sepsis, unspecified organism: Principal | ICD-10-CM | POA: Diagnosis present

## 2012-11-02 DIAGNOSIS — I2782 Chronic pulmonary embolism: Secondary | ICD-10-CM | POA: Diagnosis present

## 2012-11-02 DIAGNOSIS — R0902 Hypoxemia: Secondary | ICD-10-CM

## 2012-11-02 DIAGNOSIS — R296 Repeated falls: Secondary | ICD-10-CM | POA: Diagnosis present

## 2012-11-02 DIAGNOSIS — Z9981 Dependence on supplemental oxygen: Secondary | ICD-10-CM

## 2012-11-02 DIAGNOSIS — J9602 Acute respiratory failure with hypercapnia: Secondary | ICD-10-CM

## 2012-11-02 DIAGNOSIS — E119 Type 2 diabetes mellitus without complications: Secondary | ICD-10-CM | POA: Diagnosis present

## 2012-11-02 DIAGNOSIS — K219 Gastro-esophageal reflux disease without esophagitis: Secondary | ICD-10-CM

## 2012-11-02 DIAGNOSIS — G9340 Encephalopathy, unspecified: Secondary | ICD-10-CM | POA: Clinically undetermined

## 2012-11-02 DIAGNOSIS — I2699 Other pulmonary embolism without acute cor pulmonale: Secondary | ICD-10-CM

## 2012-11-02 DIAGNOSIS — Z9119 Patient's noncompliance with other medical treatment and regimen: Secondary | ICD-10-CM

## 2012-11-02 DIAGNOSIS — L0390 Cellulitis, unspecified: Secondary | ICD-10-CM | POA: Diagnosis present

## 2012-11-02 DIAGNOSIS — L02419 Cutaneous abscess of limb, unspecified: Secondary | ICD-10-CM | POA: Diagnosis present

## 2012-11-02 DIAGNOSIS — Z91199 Patient's noncompliance with other medical treatment and regimen due to unspecified reason: Secondary | ICD-10-CM

## 2012-11-02 DIAGNOSIS — E662 Morbid (severe) obesity with alveolar hypoventilation: Secondary | ICD-10-CM

## 2012-11-02 DIAGNOSIS — J690 Pneumonitis due to inhalation of food and vomit: Secondary | ICD-10-CM | POA: Diagnosis present

## 2012-11-02 DIAGNOSIS — R5381 Other malaise: Secondary | ICD-10-CM | POA: Diagnosis present

## 2012-11-02 LAB — URINALYSIS, ROUTINE W REFLEX MICROSCOPIC
Glucose, UA: NEGATIVE mg/dL
Ketones, ur: 15 mg/dL — AB
Leukocytes, UA: NEGATIVE
Nitrite: NEGATIVE
Protein, ur: 30 mg/dL — AB
pH: 6 (ref 5.0–8.0)

## 2012-11-02 LAB — CBC WITH DIFFERENTIAL/PLATELET
Basophils Absolute: 0 10*3/uL (ref 0.0–0.1)
Basophils Relative: 0 % (ref 0–1)
Lymphocytes Relative: 12 % (ref 12–46)
MCHC: 32 g/dL (ref 30.0–36.0)
Neutro Abs: 7 10*3/uL (ref 1.7–7.7)
Neutrophils Relative %: 74 % (ref 43–77)
Platelets: 217 10*3/uL (ref 150–400)
RDW: 15.9 % — ABNORMAL HIGH (ref 11.5–15.5)
WBC: 9.5 10*3/uL (ref 4.0–10.5)

## 2012-11-02 LAB — PROTIME-INR: Prothrombin Time: 30.8 seconds — ABNORMAL HIGH (ref 11.6–15.2)

## 2012-11-02 LAB — URINE MICROSCOPIC-ADD ON

## 2012-11-02 LAB — BASIC METABOLIC PANEL
CO2: 28 mEq/L (ref 19–32)
Chloride: 100 mEq/L (ref 96–112)
Creatinine, Ser: 0.62 mg/dL (ref 0.50–1.10)
GFR calc Af Amer: >90 mL/min (ref >90)
Sodium: 137 mEq/L (ref 135–145)

## 2012-11-02 LAB — GLUCOSE, CAPILLARY

## 2012-11-02 MED ORDER — VANCOMYCIN HCL 10 G IV SOLR
1500.0000 mg | Freq: Two times a day (BID) | INTRAVENOUS | Status: DC
  Administered 2012-11-03 – 2012-11-04 (×2): 1500 mg via INTRAVENOUS
  Filled 2012-11-02 (×5): qty 1500

## 2012-11-02 MED ORDER — PIPERACILLIN-TAZOBACTAM 3.375 G IVPB
3.3750 g | Freq: Three times a day (TID) | INTRAVENOUS | Status: DC
  Administered 2012-11-03: 3.375 g via INTRAVENOUS
  Filled 2012-11-02 (×3): qty 50

## 2012-11-02 MED ORDER — INSULIN ASPART 100 UNIT/ML ~~LOC~~ SOLN: 0.0000 [IU] | Freq: Every day | SUBCUTANEOUS | Status: DC

## 2012-11-02 MED ORDER — METOPROLOL SUCCINATE ER 50 MG PO TB24
50.0000 mg | ORAL_TABLET | Freq: Every day | ORAL | Status: DC
  Filled 2012-11-02: qty 1

## 2012-11-02 MED ORDER — CLINDAMYCIN PHOSPHATE 600 MG/50ML IV SOLN
600.0000 mg | Freq: Once | INTRAVENOUS | Status: AC
  Administered 2012-11-02: 600 mg via INTRAVENOUS
  Filled 2012-11-02: qty 50

## 2012-11-02 MED ORDER — SODIUM CHLORIDE 0.9 % IJ SOLN: 3.0000 mL | INTRAMUSCULAR | Status: DC | PRN

## 2012-11-02 MED ORDER — SODIUM CHLORIDE 0.9 % IJ SOLN
3.0000 mL | Freq: Two times a day (BID) | INTRAMUSCULAR | Status: DC
  Administered 2012-11-04 – 2012-11-07 (×7): 3 mL via INTRAVENOUS

## 2012-11-02 MED ORDER — TRANDOLAPRIL 2 MG PO TABS
2.0000 mg | ORAL_TABLET | Freq: Every day | ORAL | Status: DC
  Filled 2012-11-02: qty 1

## 2012-11-02 MED ORDER — ENOXAPARIN SODIUM 40 MG/0.4ML ~~LOC~~ SOLN: 40.0000 mg | SUBCUTANEOUS | Status: DC

## 2012-11-02 MED ORDER — WARFARIN SODIUM 5 MG PO TABS: 5.0000 mg | ORAL_TABLET | ORAL | Status: DC

## 2012-11-02 MED ORDER — FLUCONAZOLE 100MG IVPB: 100.0000 mg | INTRAVENOUS | Status: DC

## 2012-11-02 MED ORDER — CLOTRIMAZOLE 1 % EX CREA: TOPICAL_CREAM | CUTANEOUS | Status: DC

## 2012-11-02 MED ORDER — VANCOMYCIN HCL 10 G IV SOLR
2500.0000 mg | Freq: Once | INTRAVENOUS | Status: DC
  Filled 2012-11-02: qty 2500

## 2012-11-02 MED ORDER — WARFARIN - PHARMACIST DOSING INPATIENT
Freq: Every day | Status: DC
  Administered 2012-11-07: 18:00:00

## 2012-11-02 MED ORDER — EZETIMIBE 10 MG PO TABS
10.0000 mg | ORAL_TABLET | Freq: Every day | ORAL | Status: DC
  Filled 2012-11-02: qty 1

## 2012-11-02 MED ORDER — PANTOPRAZOLE SODIUM 40 MG PO TBEC: 40.0000 mg | DELAYED_RELEASE_TABLET | Freq: Every day | ORAL | Status: DC

## 2012-11-02 MED ORDER — PIPERACILLIN-TAZOBACTAM 3.375 G IVPB
3.3750 g | Freq: Once | INTRAVENOUS | Status: DC
  Filled 2012-11-02: qty 50

## 2012-11-02 MED ORDER — ATORVASTATIN CALCIUM 40 MG PO TABS
40.0000 mg | ORAL_TABLET | Freq: Every day | ORAL | Status: DC
  Filled 2012-11-02: qty 1

## 2012-11-02 MED ORDER — ACETAMINOPHEN 650 MG RE SUPP: 650.0000 mg | Freq: Four times a day (QID) | RECTAL | Status: DC | PRN

## 2012-11-02 MED ORDER — SODIUM CHLORIDE 0.9 % IV SOLN: 250.0000 mL | INTRAVENOUS | Status: DC | PRN

## 2012-11-02 MED ORDER — OXYCODONE-ACETAMINOPHEN 5-325 MG PO TABS: 1.0000 | ORAL_TABLET | ORAL | Status: DC | PRN

## 2012-11-02 MED ORDER — ONDANSETRON HCL 4 MG PO TABS: 4.0000 mg | ORAL_TABLET | Freq: Four times a day (QID) | ORAL | Status: DC | PRN

## 2012-11-02 MED ORDER — ACETAMINOPHEN 325 MG PO TABS
650.0000 mg | ORAL_TABLET | Freq: Four times a day (QID) | ORAL | Status: DC | PRN
  Filled 2012-11-02: qty 2

## 2012-11-02 MED ORDER — LORAZEPAM 0.5 MG PO TABS: 0.5000 mg | ORAL_TABLET | Freq: Two times a day (BID) | ORAL | Status: DC | PRN

## 2012-11-02 MED ORDER — CLINDAMYCIN HCL 150 MG PO CAPS: 300.0000 mg | ORAL_CAPSULE | Freq: Four times a day (QID) | ORAL | Status: DC

## 2012-11-02 MED ORDER — VITAMIN D3 25 MCG (1000 UNIT) PO TABS: 4000.0000 [IU] | ORAL_TABLET | Freq: Every day | ORAL | Status: DC

## 2012-11-02 MED ORDER — FLUCONAZOLE 150 MG PO TABS: ORAL_TABLET | ORAL | Status: DC

## 2012-11-02 MED ORDER — INSULIN ASPART 100 UNIT/ML ~~LOC~~ SOLN: 0.0000 [IU] | Freq: Three times a day (TID) | SUBCUTANEOUS | Status: DC

## 2012-11-02 MED ORDER — FLUCONAZOLE 100MG IVPB
100.0000 mg | INTRAVENOUS | Status: DC
  Administered 2012-11-03: 100 mg via INTRAVENOUS
  Filled 2012-11-02 (×4): qty 50

## 2012-11-02 MED ORDER — ONDANSETRON HCL 4 MG/2ML IJ SOLN
4.0000 mg | Freq: Four times a day (QID) | INTRAMUSCULAR | Status: DC | PRN
  Filled 2012-11-02: qty 2

## 2012-11-02 NOTE — Progress Notes (Signed)
ANTICOAGULATION & ANTIBIOTIC CONSULT NOTE - Initial Consult  Pharmacy Consult for Vancomycin + Zosyn + Warfarin Indication: Empiric cellulitis coverage and hx PE  No Known Allergies  Patient Measurements: Weight: 366 lb 12.8 oz (166.379 kg)  Vital Signs: Temp: 97.6 F (36.4 C) (01/13 1840) Temp src: Oral (01/13 1840) BP: 87/43 mmHg (01/13 1840) Pulse Rate: 75  (01/13 1840)  Labs:  Basename 11/02/12 1359 11/02/12 1225  HGB -- 12.4  HCT -- 38.7  PLT -- 217  APTT -- --  LABPROT 30.8* --  INR 3.17* --  HEPARINUNFRC -- --  CREATININE -- 0.62  CKTOTAL -- --  CKMB -- --  TROPONINI -- --    The CrCl is unknown because both a height and weight (above a minimum accepted value) are required for this calculation.   Medical History: Past Medical History  Diagnosis Date  . HTN (hypertension)   . Hyperlipemia   . GERD (gastroesophageal reflux disease)   . Obesity   . Sleep apnea     she has not been on CPAP  . PE (pulmonary embolism) 05/2011  . DOE (dyspnea on exertion) 01/22/2012  . Hematuria 01/22/2012    Assessment: 67 y.o. F who presented to the [facility] on 11/02/12 after a fall and was found to have intertrigo and secondary cellulitis. Pharmacy was consulted to start Vancomycin + Zosyn along with Fluconazole per MD for empiric cellulitis coverage. The patient received a dose of Clindamycin and Fluconazole in the [facility], but has yet to receive any doses of Vanc or Zosyn. Wt~166 kg, SCr 0.62, CrCl~60-65 ml/min (normalized)  Pharmacy was also consulted to continue the patient's home warfarin for hx PE. PTA the patient was known to be taking warfarin 5 mg daily EXCEPT for 7.5 mg on Tues/Thurs. Last dose PTA was on 1/12. On admission the patient's INR was SUPRAtherapeutic at  3.17. Will plan to hold warfarin this evening to monitor trend in INR prior to resuming. The patient is also starting on fluconazole which has been known to increase warfarin sensitivity. Will monitor interaction  closely.  Goal of Therapy:  Vancomycin trough of 10-15 mcg/ml Proper antibiotics for infection/cultures adjusted for renal/hepatic function  INR 2-3   Plan:  1. Vancomycin 2500 mg x 1 dose to load 2. Vancomycin 1500 mg IV every 12 hours 3. Zosyn 3.375g IV every 8 hours 4. Hold warfarin dose tonight 5. Daily PT/INR 6. Will continue to monitor for any signs/symptoms of bleeding and will follow up with PT/INR in the a.m.  7. Will continue to follow renal function, culture results, LOT, and antibiotic de-escalation plans   Georgina Pillion, PharmD, BCPS Clinical Pharmacist Pager: [phone] 11/02/2012 8:37 PM

## 2012-11-02 NOTE — ED Notes (Signed)
To ED via [facility] with c/o fall from standing position- was on floor in kitchen since 5am, until friend came to change stockings. States feet just slipped out from under her because she was only wearing support hose. Knee high support hose removed on arrival- saturated with urine and weeping fluids from legs.

## 2012-11-02 NOTE — H&P (Addendum)
[facility] History and Physical  Kathleen [name] ZOX:[id number] DOB: [birth year] DOA: 11/02/2012  Referring physician: er PCP: Horald Pollen., PA  Specialists:   Chief Complaint: fall at home/cellulitis  HPI: Kathleen [name] is a 67 y.o. female with multiple medical problems. Who comes after falling at home.  She lives at home alone.  She usually uses a walker but was wearing  Regular hose and her feet slipped out from under her.  A friend who usually checks on her in the AM came and found on her on the floor has been there since about 5 AM.   She called EMS who brought her to the ER.  In the ER she was found to have a cellulitis, some chronic, some acute.  This is foul smelling.  This is causing her pain.    She wears O2 at home but not continuously.     + SOB + edema in legs  L>R No CP No fever No chills +swelling in hand  Review of Systems: all systems reviewed, negative unless stated above  Past Medical History  Diagnosis Date  . HTN (hypertension)   . Hyperlipemia   . GERD (gastroesophageal reflux disease)   . Obesity   . Sleep apnea     she has not been on CPAP  . PE (pulmonary embolism) 05/2011  . DOE (dyspnea on exertion) 01/22/2012  . Hematuria 01/22/2012   Past Surgical History  Procedure Date  . Total abdominal hysterectomy w/ bilateral salpingoophorectomy    Social History:  reports that she has never smoked. She has never used smokeless tobacco. She reports that she does not drink alcohol or use illicit drugs.   No Known Allergies  Family History  Problem Relation Age of Onset  . Adopted: Yes     Prior to Admission medications   Medication Sig Start Date End Date Taking? Authorizing Provider  atorvastatin (LIPITOR) 40 MG tablet Take 40 mg by mouth at bedtime.    Yes Historical Provider, MD  cholecalciferol (VITAMIN D) 1000 UNITS tablet Take 4,000 Units by mouth daily.   Yes Historical Provider, MD  ezetimibe (ZETIA) 10 MG tablet Take 10 mg by mouth at  bedtime.    Yes Historical Provider, MD  LORazepam (ATIVAN) 0.5 MG tablet Take 0.5 mg by mouth every 12 (twelve) hours as needed. For anxiety   Yes Historical Provider, MD  metoprolol succinate (TOPROL-XL) 50 MG 24 hr tablet Take 50 mg by mouth daily. Take with or immediately following a meal.   Yes Historical Provider, MD  moexipril (UNIVASC) 15 MG tablet Take 15 mg by mouth daily.    Yes Historical Provider, MD  omeprazole (PRILOSEC) 20 MG capsule Take 20 mg by mouth daily.   Yes Historical Provider, MD  warfarin (COUMADIN) 5 MG tablet Take 5 mg by mouth See admin instructions. 5mg  daily Except 7.5mg  Tuesdays and Thursdays only   Yes Historical Provider, MD  clindamycin (CLEOCIN) 150 MG capsule Take 2 capsules (300 mg total) by mouth 4 (four) times daily. May dispense as 150mg  capsules 11/02/12   Glade Nurse, PA-C  clotrimazole (LOTRIMIN) 1 % cream Apply to affected areas twice daily. 11/02/12   Glade Nurse, PA-C  fluconazole (DIFLUCAN) 150 MG tablet 150 mg PO once a week for four weeks 11/02/12   Glade Nurse, PA-C  oxyCODONE-acetaminophen (PERCOCET/ROXICET) 5-325 MG per tablet Take 1 tablet by mouth every 4 (four) hours as needed for pain. May take 2 tablets PO q 6 hours for severe  pain - Do not take with Tylenol as this tablet already contains tylenol 11/02/12   Glade Nurse, PA-C   Physical Exam: Filed Vitals:   11/02/12 0930 11/02/12 0953 11/02/12 1400  BP:  96/53 100/58  Pulse:  80 71  Resp:  22 24  SpO2: 93% 93% 89%     General:  obese  Eyes: wnl  ENT: wnl  Neck: supple  Cardiovascular: rrr  Respiratory: dec BS- distant sounds  Abdomen: large pannus with redness and moisture, some areas of bleeding   Skin: changes B/L legs- + pitting edema to mid thighs  Musculoskeletal: moves all 4 extremities  Psychiatric: no SI/noHI  Neurologic: cn 2 -12 intact  Labs on Admission:  Basic Metabolic Panel:  Lab 11/02/12 1610  NA 137  K 4.5  CL 100  CO2 28  GLUCOSE 133*  BUN  23  CREATININE 0.62  CALCIUM 8.4  MG --  PHOS --   Liver Function Tests: No results found for this basename: AST:5,ALT:5,ALKPHOS:5,BILITOT:5,PROT:5,ALBUMIN:5 in the last 168 hours No results found for this basename: LIPASE:5,AMYLASE:5 in the last 168 hours No results found for this basename: AMMONIA:5 in the last 168 hours CBC:  Lab 11/02/12 1225  WBC 9.5  NEUTROABS 7.0  HGB 12.4  HCT 38.7  MCV 93.9  PLT 217   Cardiac Enzymes: No results found for this basename: CKTOTAL:5,CKMB:5,CKMBINDEX:5,TROPONINI:5 in the last 168 hours  BNP (last 3 results) No results found for this basename: PROBNP:3 in the last 8760 hours CBG: No results found for this basename: GLUCAP:5 in the last 168 hours  Radiological Exams on Admission: Ct Head Wo Contrast  11/02/2012  *RADIOLOGY REPORT*  Clinical Data:  Fall with trauma to the head and neck.  Unable to get up.  Weakness.  CT HEAD WITHOUT CONTRAST CT CERVICAL SPINE WITHOUT CONTRAST  Technique:  Multidetector CT imaging of the head and cervical spine was performed following the standard protocol without intravenous contrast.  Multiplanar CT image reconstructions of the cervical spine were also generated.  Comparison:   None  CT HEAD  Findings: There are areas of low density in the hemispheric white matter bilaterally most consistent with chronic small vessel disease.  No sign of acute infarction, mass lesion, hemorrhage, hydrocephalus or extra-axial collection.  No skull fracture.  No traumatic fluid in the sinuses.  There is some mucosal thickening within the sinuses. There is atherosclerotic calcification of the major vessels at the base of the brain.  IMPRESSION: No acute appearing finding.  Chronic small vessel changes within the hemispheric white matter.  No traumatic finding.  CT CERVICAL SPINE  Findings: No fracture or traumatic malalignment.  There is degenerative spondylosis at C3-4, C5-6, C6-7 and C7-T1.  No severe encroachment upon the canal.   There is some foraminal narrowing in the region because of osteophytic encroachment.  Lesser degenerative changes are also noted at C4-5.  The facets appear to be fused at the C2-3 level.  IMPRESSION: No acute or traumatic finding.  Fusion at the C2-3 level.  Adjacent segment degenerative spondylosis at C3-4 as well as at C5-6, C6-7 and C7-T1.   Original Report Authenticated By: Paulina Fusi, M.D.    Ct Cervical Spine Wo Contrast  11/02/2012  *RADIOLOGY REPORT*  Clinical Data:  Fall with trauma to the head and neck.  Unable to get up.  Weakness.  CT HEAD WITHOUT CONTRAST CT CERVICAL SPINE WITHOUT CONTRAST  Technique:  Multidetector CT imaging of the head and cervical spine was performed following  the standard protocol without intravenous contrast.  Multiplanar CT image reconstructions of the cervical spine were also generated.  Comparison:   None  CT HEAD  Findings: There are areas of low density in the hemispheric white matter bilaterally most consistent with chronic small vessel disease.  No sign of acute infarction, mass lesion, hemorrhage, hydrocephalus or extra-axial collection.  No skull fracture.  No traumatic fluid in the sinuses.  There is some mucosal thickening within the sinuses. There is atherosclerotic calcification of the major vessels at the base of the brain.  IMPRESSION: No acute appearing finding.  Chronic small vessel changes within the hemispheric white matter.  No traumatic finding.  CT CERVICAL SPINE  Findings: No fracture or traumatic malalignment.  There is degenerative spondylosis at C3-4, C5-6, C6-7 and C7-T1.  No severe encroachment upon the canal.  There is some foraminal narrowing in the region because of osteophytic encroachment.  Lesser degenerative changes are also noted at C4-5.  The facets appear to be fused at the C2-3 level.  IMPRESSION: No acute or traumatic finding.  Fusion at the C2-3 level.  Adjacent segment degenerative spondylosis at C3-4 as well as at C5-6, C6-7 and  C7-T1.   Original Report Authenticated By: Paulina Fusi, M.D.    Dg Shoulder Left  11/02/2012  *RADIOLOGY REPORT*  Clinical Data: Fall with shoulder pain.  LEFT SHOULDER - 2+ VIEW  Comparison: None.  Findings: No fracture or dislocation.  Degenerative changes are seen in the acromioclavicular joint.  Visualized portion of the left chest shows left apical pleural thickening.  IMPRESSION: Left acromioclavicular joint osteoarthritis.   Original Report Authenticated By: Leanna Battles, M.D.    Dg Knee Complete 4 Views Right  11/02/2012  *RADIOLOGY REPORT*  Clinical Data: Anterior right knee pain post fall, cellulitis right leg  RIGHT KNEE - COMPLETE 4+ VIEW  Comparison: None  Findings: Scattered soft tissue swelling particularly anteriorly and laterally at the right thigh and proximal right lower leg. Osseous demineralization. Joint spaces preserved. Bedding artifacts. Patellar spur at quadriceps tendon insertion. No acute fracture, dislocation, or bone destruction. Suspect small knee joint effusion.  IMPRESSION: No acute osseous abnormalities. Scattered soft tissue swelling.   Original Report Authenticated By: Ulyses Southward, M.D.       Assessment/Plan Active Problems:  Obesity  PE (pulmonary embolism)  Cellulitis   1. Cellulitis:  Vanc/ zosyn, IV diflucan wound care nurse- pharmacy consult 2. DM- SSI, Hgb A1C 3. PE- coumadin, pharmacy consult 4. HTN- home meds 5. LE edema- echo earlier this year was ok with EF- r/o DVT, may need lasix 6. Fall- PT/OT eval, social work consult 7. Obesity    Code Status: full Family Communication: friends at bedside Disposition Plan: ?SNF vs home- PT consult  Time spent: 70 min  Elvin Banker [facility] Pager [phone]  If 7PM-7AM, please contact night-coverage [email or web address] Password [facility] 11/02/2012, 4:26 PM

## 2012-11-02 NOTE — Progress Notes (Signed)
At 1845. Patient here from ER. Transferred to bed. On O2 2L. In no acute distress. Discussed fall and multiple concerns related to skin integrity to groin and bilateral legs. Blood pressure low at 87/43. To monitor patient's comfort and report to next nurse patient assessment. Sherlyn Lees, RN.

## 2012-11-02 NOTE — ED Provider Notes (Signed)
Medical screening examination/treatment/procedure(s) were conducted as a shared visit with non-physician practitioner(s) and myself.  I personally evaluated the patient during the encounter  Pt with multiple chronic medical issues including intertrigo with secondary cellulitis and difficulty walking at home due to pain, swelling, obesity. She cannot ambulate here without assistance. I suspect she will need some IV Abx and placement in SNF as she has exhausted her home health days. Will discuss with Hospitalist.   Bonnita Levan. Bernette Mayers, MD 11/02/12 1536

## 2012-11-02 NOTE — ED Provider Notes (Signed)
History     CSN: [id number]  Arrival date & time 11/02/12  1191   None     No chief complaint on file.   (Consider location/radiation/quality/duration/timing/severity/associated sxs/prior treatment) HPI Comments: 67 yo female presents victim of fall in her home at 5 am this morning. She was walking in her dining room with her walker when she tripped on the rug, lost her balance, hitting her head on the table as she went down, landing on her right knee and left shoulder. Pt was unsure whether or not she lost consciousness. Pt was unable to pull herself up or reach her cell phone and laid on the floor until a friend came to her house at 9am. Pt arrived by EMS who were unable to collar her due to her morbid obesity and unavailability of cervical spine. Pt was not ambulatory and was brought in by stretcher.  Pt stated pain was 9/10. She took no interventions to help herself. The pain was worse with movement and better when she lay still. The pain has gotten no bettor or no worse since impact. There was no loss of blood.   Pt denies chest pain, shortness of breath, dizziness, nausea, vomiting, numbness/tingling, bowel or urinary incontinence, or headaches.   Patient is a 67 y.o. female presenting with fall.  Fall Pertinent negatives include no fever, no numbness, no nausea, no vomiting and no headaches.    Past Medical History  Diagnosis Date  . HTN (hypertension)   . Hyperlipemia   . GERD (gastroesophageal reflux disease)   . Obesity   . Sleep apnea     she has not been on CPAP  . PE (pulmonary embolism) 05/2011  . DOE (dyspnea on exertion) 01/22/2012  . Hematuria 01/22/2012    Past Surgical History  Procedure Date  . Total abdominal hysterectomy w/ bilateral salpingoophorectomy     Family History  Problem Relation Age of Onset  . Adopted: Yes    History  Substance Use Topics  . Smoking status: Never Smoker   . Smokeless tobacco: Never Used  . Alcohol Use: No    OB  History    Grav Para Term Preterm Abortions TAB SAB Ect Mult Living                  Review of Systems  Constitutional: Negative for fever and diaphoresis.  HENT: Negative for neck pain and neck stiffness.   Eyes: Negative for visual disturbance.  Respiratory: Negative for apnea, chest tightness and shortness of breath.   Cardiovascular: Negative for chest pain and palpitations.  Gastrointestinal: Negative for nausea, vomiting, diarrhea and constipation.  Genitourinary: Negative for dysuria.  Musculoskeletal: Positive for gait problem.       Pain to cervical spine, left shoulder, right knee.  Skin:       Diffuse cellulitis on bilateral lower extremities. Chronic skin yeast infection on abdomen.  Neurological: Negative for dizziness, weakness, light-headedness, numbness and headaches.       States some balance problems over the last few months, not a new problem  All other systems reviewed and are negative.    Allergies  Review of patient's allergies indicates no known allergies.  Home Medications   Current Outpatient Rx  Name  Route  Sig  Dispense  Refill  . WARFARIN SODIUM 5 MG PO TABS   Oral   Take 5 mg by mouth See admin instructions. 5mg  daily Except 7.5mg  Tuesdays and Thursdays only         .  ATORVASTATIN CALCIUM 40 MG PO TABS   Oral   Take 40 mg by mouth daily.         Marland Kitchen VITAMIN D 1000 UNITS PO TABS   Oral   Take 4,000 Units by mouth daily.         Marland Kitchen EZETIMIBE 10 MG PO TABS   Oral   Take 10 mg by mouth daily.         Marland Kitchen LORAZEPAM 0.5 MG PO TABS   Oral   Take 0.5 mg by mouth every 12 (twelve) hours as needed. For anxiety         . METOPROLOL SUCCINATE ER 50 MG PO TB24   Oral   Take 50 mg by mouth daily. Take with or immediately following a meal.         . MOEXIPRIL HCL 15 MG PO TABS   Oral   Take 15 mg by mouth at bedtime.         . OMEPRAZOLE 20 MG PO CPDR   Oral   Take 20 mg by mouth daily.           There were no vitals taken for  this visit.  Physical Exam  Nursing note and vitals reviewed. Constitutional: She is oriented to person, place, and time. She appears well-developed. No distress.       Morbidly obese  HENT:  Head: Normocephalic and atraumatic.       Slight bruising on left forehead   Eyes: Conjunctivae normal and EOM are normal. Pupils are equal, round, and reactive to light.  Neck: Normal range of motion. Neck supple.       No meningeal signs.  Cardiovascular: Normal rate, regular rhythm, normal heart sounds and intact distal pulses.  Exam reveals no gallop and no friction rub.   No murmur heard. Pulmonary/Chest: Effort normal and breath sounds normal. No respiratory distress. She has no wheezes. She has no rales. She exhibits no tenderness.  Abdominal: Soft. Bowel sounds are normal. She exhibits no distension and no mass. There is tenderness. There is no rebound and no guarding.       Chronic skin yeast infection on abdomen, weeping, tender painful. Some open sores, some pus. Being treated as outpatient.   Musculoskeletal: She exhibits edema and tenderness.       Cervical spine tenderness on palpation. Decreased, painful ROM to left shoulder, right knee. Bilateral legs have poor ROM overall. Strength 3/5  Neurological: She is alert and oriented to person, place, and time. No cranial nerve deficit.       Sensory-neuro intact to light touch  Skin: Skin is warm and dry. She is not diaphoretic.       Chronic skin yeast infection on abdomen, weeping, tender painful. Some open sores, some pus. Being treated as outpatient. Diffuse cellulitis on bilateral lower extremities, red, warm to touch, tender.    ED Course  Procedures (including critical care time)  Labs Reviewed - No data to display No results found. Results for orders placed during the hospital encounter of 11/02/12  CBC WITH DIFFERENTIAL      Component Value Range   WBC 9.5  4.0 - 10.5 K/uL   RBC 4.12  3.87 - 5.11 MIL/uL   Hemoglobin 12.4   12.0 - 15.0 g/dL   HCT 09.8  11.9 - 14.7 %   MCV 93.9  78.0 - 100.0 fL   MCH 30.1  26.0 - 34.0 pg   MCHC 32.0  30.0 - 36.0  g/dL   RDW 08.6 (*) 57.8 - 46.9 %   Platelets 217  150 - 400 K/uL   Neutrophils Relative 74  43 - 77 %   Neutro Abs 7.0  1.7 - 7.7 K/uL   Lymphocytes Relative 12  12 - 46 %   Lymphs Abs 1.2  0.7 - 4.0 K/uL   Monocytes Relative 13 (*) 3 - 12 %   Monocytes Absolute 1.2 (*) 0.1 - 1.0 K/uL   Eosinophils Relative 1  0 - 5 %   Eosinophils Absolute 0.1  0.0 - 0.7 K/uL   Basophils Relative 0  0 - 1 %   Basophils Absolute 0.0  0.0 - 0.1 K/uL  BASIC METABOLIC PANEL      Component Value Range   Sodium 137  135 - 145 mEq/L   Potassium 4.5  3.5 - 5.1 mEq/L   Chloride 100  96 - 112 mEq/L   CO2 28  19 - 32 mEq/L   Glucose, Bld 133 (*) 70 - 99 mg/dL   BUN 23  6 - 23 mg/dL   Creatinine, Ser 6.29  0.50 - 1.10 mg/dL   Calcium 8.4  8.4 - 52.8 mg/dL   GFR calc non Af Amer >90  >90 mL/min   GFR calc Af Amer >90  >90 mL/min  URINALYSIS, ROUTINE W REFLEX MICROSCOPIC      Component Value Range   Color, Urine YELLOW  YELLOW   APPearance HAZY (*) CLEAR   Specific Gravity, Urine 1.016  1.005 - 1.030   pH 6.0  5.0 - 8.0   Glucose, UA NEGATIVE  NEGATIVE mg/dL   Hgb urine dipstick NEGATIVE  NEGATIVE   Bilirubin Urine NEGATIVE  NEGATIVE   Ketones, ur 15 (*) NEGATIVE mg/dL   Protein, ur 30 (*) NEGATIVE mg/dL   Urobilinogen, UA 1.0  0.0 - 1.0 mg/dL   Nitrite NEGATIVE  NEGATIVE   Leukocytes, UA NEGATIVE  NEGATIVE  PROTIME-INR      Component Value Range   Prothrombin Time 30.8 (*) 11.6 - 15.2 seconds   INR 3.17 (*) 0.00 - 1.49  URINE MICROSCOPIC-ADD ON      Component Value Range   Squamous Epithelial / LPF RARE  RARE   WBC, UA 0-2  <3 WBC/hpf   Bacteria, UA MANY (*) RARE   Ct Head Wo Contrast  11/02/2012  *RADIOLOGY REPORT*  Clinical Data:  Fall with trauma to the head and neck.  Unable to get up.  Weakness.  CT HEAD WITHOUT CONTRAST CT CERVICAL SPINE WITHOUT CONTRAST   Technique:  Multidetector CT imaging of the head and cervical spine was performed following the standard protocol without intravenous contrast.  Multiplanar CT image reconstructions of the cervical spine were also generated.  Comparison:   None  CT HEAD  Findings: There are areas of low density in the hemispheric white matter bilaterally most consistent with chronic small vessel disease.  No sign of acute infarction, mass lesion, hemorrhage, hydrocephalus or extra-axial collection.  No skull fracture.  No traumatic fluid in the sinuses.  There is some mucosal thickening within the sinuses. There is atherosclerotic calcification of the major vessels at the base of the brain.  IMPRESSION: No acute appearing finding.  Chronic small vessel changes within the hemispheric white matter.  No traumatic finding.  CT CERVICAL SPINE  Findings: No fracture or traumatic malalignment.  There is degenerative spondylosis at C3-4, C5-6, C6-7 and C7-T1.  No severe encroachment upon the canal.  There is some  foraminal narrowing in the region because of osteophytic encroachment.  Lesser degenerative changes are also noted at C4-5.  The facets appear to be fused at the C2-3 level.  IMPRESSION: No acute or traumatic finding.  Fusion at the C2-3 level.  Adjacent segment degenerative spondylosis at C3-4 as well as at C5-6, C6-7 and C7-T1.   Original Report Authenticated By: Paulina Fusi, M.D.    Ct Cervical Spine Wo Contrast  11/02/2012  *RADIOLOGY REPORT*  Clinical Data:  Fall with trauma to the head and neck.  Unable to get up.  Weakness.  CT HEAD WITHOUT CONTRAST CT CERVICAL SPINE WITHOUT CONTRAST  Technique:  Multidetector CT imaging of the head and cervical spine was performed following the standard protocol without intravenous contrast.  Multiplanar CT image reconstructions of the cervical spine were also generated.  Comparison:   None  CT HEAD  Findings: There are areas of low density in the hemispheric white matter bilaterally  most consistent with chronic small vessel disease.  No sign of acute infarction, mass lesion, hemorrhage, hydrocephalus or extra-axial collection.  No skull fracture.  No traumatic fluid in the sinuses.  There is some mucosal thickening within the sinuses. There is atherosclerotic calcification of the major vessels at the base of the brain.  IMPRESSION: No acute appearing finding.  Chronic small vessel changes within the hemispheric white matter.  No traumatic finding.  CT CERVICAL SPINE  Findings: No fracture or traumatic malalignment.  There is degenerative spondylosis at C3-4, C5-6, C6-7 and C7-T1.  No severe encroachment upon the canal.  There is some foraminal narrowing in the region because of osteophytic encroachment.  Lesser degenerative changes are also noted at C4-5.  The facets appear to be fused at the C2-3 level.  IMPRESSION: No acute or traumatic finding.  Fusion at the C2-3 level.  Adjacent segment degenerative spondylosis at C3-4 as well as at C5-6, C6-7 and C7-T1.   Original Report Authenticated By: Paulina Fusi, M.D.    Dg Shoulder Left  11/02/2012  *RADIOLOGY REPORT*  Clinical Data: Fall with shoulder pain.  LEFT SHOULDER - 2+ VIEW  Comparison: None.  Findings: No fracture or dislocation.  Degenerative changes are seen in the acromioclavicular joint.  Visualized portion of the left chest shows left apical pleural thickening.  IMPRESSION: Left acromioclavicular joint osteoarthritis.   Original Report Authenticated By: Leanna Battles, M.D.    Dg Knee Complete 4 Views Right  11/02/2012  *RADIOLOGY REPORT*  Clinical Data: Anterior right knee pain post fall, cellulitis right leg  RIGHT KNEE - COMPLETE 4+ VIEW  Comparison: None  Findings: Scattered soft tissue swelling particularly anteriorly and laterally at the right thigh and proximal right lower leg. Osseous demineralization. Joint spaces preserved. Bedding artifacts. Patellar spur at quadriceps tendon insertion. No acute fracture, dislocation,  or bone destruction. Suspect small knee joint effusion.  IMPRESSION: No acute osseous abnormalities. Scattered soft tissue swelling.   Original Report Authenticated By: Ulyses Southward, M.D.     Diagnosis: fall, weakness, shoulder pain, yeast infection on skin, bilateral lower extremity cellulitis   MDM  Multiple chronic medical issues. Xrays show no acute fractures, support chronic clinical findings. INR needs to be brought into therapeutic range. Urinalysis shows no UTI. Pt unable to ambulate. Admitting patient for IV antibiotics and placement in SNF.   Glade Nurse, PA-C 11/02/12 1734

## 2012-11-03 ENCOUNTER — Encounter (HOSPITAL_COMMUNITY): Payer: Self-pay

## 2012-11-03 ENCOUNTER — Inpatient Hospital Stay (HOSPITAL_COMMUNITY): Payer: Medicare Other

## 2012-11-03 DIAGNOSIS — J9620 Acute and chronic respiratory failure, unspecified whether with hypoxia or hypercapnia: Secondary | ICD-10-CM

## 2012-11-03 DIAGNOSIS — G4733 Obstructive sleep apnea (adult) (pediatric): Secondary | ICD-10-CM

## 2012-11-03 DIAGNOSIS — J9602 Acute respiratory failure with hypercapnia: Secondary | ICD-10-CM | POA: Insufficient documentation

## 2012-11-03 DIAGNOSIS — I959 Hypotension, unspecified: Secondary | ICD-10-CM

## 2012-11-03 DIAGNOSIS — E662 Morbid (severe) obesity with alveolar hypoventilation: Secondary | ICD-10-CM

## 2012-11-03 DIAGNOSIS — G9340 Encephalopathy, unspecified: Secondary | ICD-10-CM | POA: Clinically undetermined

## 2012-11-03 DIAGNOSIS — J9600 Acute respiratory failure, unspecified whether with hypoxia or hypercapnia: Secondary | ICD-10-CM

## 2012-11-03 LAB — URINE MICROSCOPIC-ADD ON

## 2012-11-03 LAB — POCT I-STAT 3, ART BLOOD GAS (G3+)
Bicarbonate: 30.6 mEq/L — ABNORMAL HIGH (ref 20.0–24.0)
Bicarbonate: 30.6 mEq/L — ABNORMAL HIGH (ref 20.0–24.0)
O2 Saturation: 100 %
TCO2: 32 mmol/L (ref 0–100)
pCO2 arterial: 89.4 mmHg (ref 35.0–45.0)
pCO2 arterial: 60.1 mmHg (ref 35.0–45.0)
pH, Arterial: 7.143 — CL (ref 7.350–7.450)
pO2, Arterial: 106 mmHg — ABNORMAL HIGH (ref 80.0–100.0)
pO2, Arterial: 221 mmHg — ABNORMAL HIGH (ref 80.0–100.0)

## 2012-11-03 LAB — URINALYSIS, ROUTINE W REFLEX MICROSCOPIC
Nitrite: NEGATIVE
Specific Gravity, Urine: 1.022 (ref 1.005–1.030)
Urobilinogen, UA: 1 mg/dL (ref 0.0–1.0)
pH: 5 (ref 5.0–8.0)

## 2012-11-03 LAB — BLOOD GAS, ARTERIAL
Drawn by: 23404
FIO2: 0.36 %
O2 Content: 4 L/min
Patient temperature: 98.6
pH, Arterial: 7.112 — CL (ref 7.350–7.450)

## 2012-11-03 LAB — PROCALCITONIN: Procalcitonin: 0.12 ng/mL

## 2012-11-03 LAB — GLUCOSE, CAPILLARY
Glucose-Capillary: 122 mg/dL — ABNORMAL HIGH (ref 70–99)
Glucose-Capillary: 132 mg/dL — ABNORMAL HIGH (ref 70–99)

## 2012-11-03 LAB — BASIC METABOLIC PANEL
BUN: 24 mg/dL — ABNORMAL HIGH (ref 6–23)
Chloride: 100 mEq/L (ref 96–112)
GFR calc Af Amer: 86 mL/min — ABNORMAL LOW (ref >90)
GFR calc non Af Amer: 74 mL/min — ABNORMAL LOW (ref >90)
Potassium: 5 mEq/L (ref 3.5–5.1)
Sodium: 137 mEq/L (ref 135–145)

## 2012-11-03 LAB — CBC
HCT: 38.8 % (ref 36.0–46.0)
Hemoglobin: 12.1 g/dL (ref 12.0–15.0)
MCHC: 31.2 g/dL (ref 30.0–36.0)
RBC: 4.03 MIL/uL (ref 3.87–5.11)

## 2012-11-03 LAB — HEMOGLOBIN A1C
Hgb A1c MFr Bld: 6.3 % — ABNORMAL HIGH (ref <5.7)
Mean Plasma Glucose: 134 mg/dL — ABNORMAL HIGH (ref <117)

## 2012-11-03 LAB — PROTIME-INR
INR: 4.32 — ABNORMAL HIGH (ref 0.00–1.49)
Prothrombin Time: 38.7 seconds — ABNORMAL HIGH (ref 11.6–15.2)

## 2012-11-03 MED ORDER — CHLORHEXIDINE GLUCONATE 0.12 % MT SOLN
OROMUCOSAL | Status: AC
  Administered 2012-11-03: 15 mL
  Filled 2012-11-03: qty 15

## 2012-11-03 MED ORDER — FENTANYL CITRATE 0.05 MG/ML IJ SOLN
50.0000 ug | Freq: Once | INTRAMUSCULAR | Status: AC
  Administered 2012-11-03: 50 ug via INTRAVENOUS

## 2012-11-03 MED ORDER — INSULIN ASPART 100 UNIT/ML ~~LOC~~ SOLN
2.0000 [IU] | SUBCUTANEOUS | Status: DC
  Administered 2012-11-03 – 2012-11-06 (×5): 2 [IU] via SUBCUTANEOUS

## 2012-11-03 MED ORDER — LEVOFLOXACIN IN D5W 750 MG/150ML IV SOLN
750.0000 mg | INTRAVENOUS | Status: DC
  Filled 2012-11-03: qty 150

## 2012-11-03 MED ORDER — MIDAZOLAM HCL 2 MG/2ML IJ SOLN
2.0000 mg | INTRAMUSCULAR | Status: DC | PRN
  Administered 2012-11-03 (×2): 2 mg via INTRAVENOUS
  Filled 2012-11-03 (×3): qty 2

## 2012-11-03 MED ORDER — NALOXONE HCL 0.4 MG/ML IJ SOLN
INTRAMUSCULAR | Status: AC
  Filled 2012-11-03: qty 1

## 2012-11-03 MED ORDER — NALOXONE HCL 0.4 MG/ML IJ SOLN
0.8000 mg | INTRAMUSCULAR | Status: DC | PRN
  Administered 2012-11-03: 0.8 mg via INTRAVENOUS

## 2012-11-03 MED ORDER — SODIUM CHLORIDE 0.9 % IV BOLUS (SEPSIS)
1000.0000 mL | Freq: Once | INTRAVENOUS | Status: AC
  Administered 2012-11-03: 1000 mL via INTRAVENOUS

## 2012-11-03 MED ORDER — BIOTENE DRY MOUTH MT LIQD
15.0000 mL | Freq: Four times a day (QID) | OROMUCOSAL | Status: DC
  Administered 2012-11-03 – 2012-11-09 (×24): 15 mL via OROMUCOSAL

## 2012-11-03 MED ORDER — SODIUM CHLORIDE 0.9 % IV SOLN
25.0000 ug/h | INTRAVENOUS | Status: DC
  Administered 2012-11-03: 75 ug/h via INTRAVENOUS
  Filled 2012-11-03 (×2): qty 50

## 2012-11-03 MED ORDER — MIDAZOLAM HCL 2 MG/2ML IJ SOLN
2.0000 mg | Freq: Once | INTRAMUSCULAR | Status: AC
  Administered 2012-11-03: 2 mg via INTRAVENOUS

## 2012-11-03 MED ORDER — LEVOFLOXACIN IN D5W 750 MG/150ML IV SOLN
750.0000 mg | Freq: Once | INTRAVENOUS | Status: AC
  Administered 2012-11-03: 750 mg via INTRAVENOUS
  Filled 2012-11-03: qty 150

## 2012-11-03 MED ORDER — NALOXONE HCL 0.4 MG/ML IJ SOLN: 0.4000 mg | INTRAMUSCULAR | Status: DC | PRN

## 2012-11-03 MED ORDER — MIDAZOLAM HCL 2 MG/2ML IJ SOLN
INTRAMUSCULAR | Status: AC
  Administered 2012-11-03: 2 mg
  Filled 2012-11-03: qty 2

## 2012-11-03 MED ORDER — DOPAMINE-DEXTROSE 3.2-5 MG/ML-% IV SOLN
5.0000 ug/kg/min | INTRAVENOUS | Status: DC
  Administered 2012-11-03: 5 ug/kg/min via INTRAVENOUS
  Administered 2012-11-03 – 2012-11-04 (×2): 8 ug/kg/min via INTRAVENOUS
  Administered 2012-11-04: 10 ug/kg/min via INTRAVENOUS
  Administered 2012-11-05 (×2): 8 ug/kg/min via INTRAVENOUS
  Administered 2012-11-06: 3 ug/kg/min via INTRAVENOUS
  Filled 2012-11-03 (×7): qty 250

## 2012-11-03 MED ORDER — FENTANYL CITRATE 0.05 MG/ML IJ SOLN
100.0000 ug | INTRAMUSCULAR | Status: DC | PRN
  Administered 2012-11-03 (×3): 100 ug via INTRAVENOUS
  Filled 2012-11-03 (×3): qty 2

## 2012-11-03 MED ORDER — FENTANYL CITRATE 0.05 MG/ML IJ SOLN
INTRAMUSCULAR | Status: AC
  Administered 2012-11-03: 50 ug via INTRAVENOUS
  Filled 2012-11-03: qty 2

## 2012-11-03 MED ORDER — IOHEXOL 300 MG/ML  SOLN
100.0000 mL | Freq: Once | INTRAMUSCULAR | Status: AC | PRN
  Administered 2012-11-03: 75 mL via INTRAVENOUS

## 2012-11-03 MED ORDER — MIDAZOLAM HCL 2 MG/2ML IJ SOLN
2.0000 mg | INTRAMUSCULAR | Status: DC | PRN
  Administered 2012-11-03 – 2012-11-05 (×2): 2 mg via INTRAVENOUS
  Filled 2012-11-03 (×3): qty 2

## 2012-11-03 MED ORDER — DOPAMINE-DEXTROSE 3.2-5 MG/ML-% IV SOLN
INTRAVENOUS | Status: AC
  Filled 2012-11-03: qty 250

## 2012-11-03 MED ORDER — ETOMIDATE 2 MG/ML IV SOLN
20.0000 mg | Freq: Once | INTRAVENOUS | Status: AC
  Administered 2012-11-03: 20 mg via INTRAVENOUS

## 2012-11-03 MED ORDER — CHLORHEXIDINE GLUCONATE 0.12 % MT SOLN
15.0000 mL | Freq: Two times a day (BID) | OROMUCOSAL | Status: DC
  Administered 2012-11-03 – 2012-11-09 (×11): 15 mL via OROMUCOSAL
  Filled 2012-11-03 (×13): qty 15

## 2012-11-03 NOTE — Progress Notes (Signed)
ANTICOAGULATION & ANTIBIOTIC CONSULT NOTE - Follow up  Pharmacy Consult for Vancomycin + Warfarin Indication: Empiric cellulitis coverage and hx PE  No Known Allergies  Patient Measurements: Height: 5\' 6"  (167.6 cm) Weight: 354 lb 15.1 oz (161 kg) IBW/kg (Calculated) : 59.3   Labs:  Basename 11/03/12 0525 11/02/12 1359 11/02/12 1225  HGB 12.1 -- 12.4  HCT 38.8 -- 38.7  PLT 210 -- 217  APTT -- -- --  LABPROT 38.7* 30.8* --  INR 4.32* 3.17* --  HEPARINUNFRC -- -- --  CREATININE 0.80 -- 0.62  CKTOTAL -- -- --  CKMB -- -- --  TROPONINI -- -- --    Estimated Creatinine Clearance: 106.3 ml/min (by C-G formula based on Cr of 0.8).   Medical History: Past Medical History  Diagnosis Date  . HTN (hypertension)   . Hyperlipemia   . GERD (gastroesophageal reflux disease)   . Obesity   . Sleep apnea     she has not been on CPAP  . PE (pulmonary embolism) 05/2011  . DOE (dyspnea on exertion) 01/22/2012  . Hematuria 01/22/2012    Assessment: Kathleen [name] presenting 11/02/12 after a fall and was found to have intertrigo and secondary cellulitis. Pharmacy was consulted for Vancomycin/Zosyn/Fluconazole. Levaquin 750mg  IVx1 and clindamycin 600mg  x1 given in ED per MD for empiric cellulitis coverage.   Zosyn discontinued by CCM today to narrow abx.  Cultures in process (urine/blood/resp)  Pharmacy was also consulted to continue the patient's home warfarin for hx PE.  INR was SUPRAtherapeutic at  4.32.   Pt has hematuria    several antibiotics on board including fluconazole which has been known to increase warfarin sensitivity. Will monitor interaction closely.  Goals of Therapy:   Vancomycin trough of 10-15 mcg/ml  Proper antibiotics for infection/cultures adjusted for renal/hepatic function   INR 2-3   Plan:  1. Continue Vancomycin 1500 mg IV every 12 hours 2. Continue Levaquin 750mg  IV q24 h 3. Hold warfarin dose tonight  4. Daily PT/INR 5. Continue to monitor for any s/s  of bleeding  6. Will continue to follow renal function, culture results, LOT, and antibiotic de-escalation plans.  Al Decant, PharmD Candidate 11/03/2012  10:14 AM   I have reviewed the plan as outlined by Carollee Herter above and agree with her care-plan.  Nadara Mustard, PharmD., MS Clinical Pharmacist Pager:  [phone] Thank you for allowing pharmacy to be part of this patients care team.

## 2012-11-03 NOTE — Procedures (Signed)
Name: [name] MRN: [id number] DOB: [birth year]   PROCEDURE NOTE  Procedure:  Endotracheal intubation.  Indication:  Acute respiratory failure  Consent:  Consent was implied due to the emergency nature of the procedure.  Anesthesia:  A total of 10 mg of Etomidate was given intravenously.  Procedure summary:  Appropriate equipment was assembled. The patient was identified as Kathleen [name] and safety timeout was performed. The patient was placed supine, with head in sniffing position. After adequate level of anesthesia was achieved, a GS#3 blade was inserted into the oropharynx and the vocal cords were visualized. A 7.5 endotracheal tube was inserted without difficulty and visualized going through the vocal cords. The stylette was removed and cuff inflated. Colorimetric change was noted on the CO2 meter. Breath sounds were heard over both lung fields equally. Post procedure chest xray was ordered.  Complications:  No immediate complications were noted.  Hemodynamic parameters and oxygenation remained stable throughout the procedure.    Orlean Bradford, M.D. Pulmonary and Critical Care Medicine [facility] Cell: [phone] Pager: [phone]  11/03/2012, 3:22 AM

## 2012-11-03 NOTE — Progress Notes (Signed)
PULMONARY  / CRITICAL CARE MEDICINE   Name: Kathleen [name] MRN: [id number] DOB: Nov 22, 1945    LOS: 1  REFERRING MD : TRH  CHIEF COMPLAINT:  Hypercapnic respiratory failure  BRIEF PATIENT DESCRIPTION:  67 yo with history of PE (therapeutic INR on Coumadin), OSA (noncompliant with BiPAP), on home oxygen and chronic opioids brought to ED 1/13 after slipping and falling at home. Admitted for chronic cellulitis.  Unresponsive / hypercarbic on 1/13 PM, therefore transferred to ICU 1/14 AM. Intubated 1/14 2/2 persistent hypercarbia despite BiPAP therapy.  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 Admitted for chronic cellulitis.  Unresponsive / hypercarbic on the floor.  Transferred to ICU.  INTERVAL HISTORY: unresponsive, afebrile  VITAL SIGNS: Temp:  [95.5 F (35.3 C)-97.7 F (36.5 C)] 96.3 F (35.7 C) (01/14 0440) Pulse Rate:  [54-84] 80  (01/14 0339) Resp:  [18-24] 18  (01/14 0339) BP: (86-100)/(32-58) 90/33 mmHg (01/14 0339) SpO2:  [86 %-99 %] 98 % (01/14 0339) FiO2 (%):  [28 %-100 %] 100 % (01/14 0339) Weight:  [354 lb 15.1 oz (161 kg)-366 lb 12.8 oz (166.379 kg)] 354 lb 15.1 oz (161 kg) (01/14 0118) HEMODYNAMICS: CVP:  [20 mmHg] 20 mmHg VENTILATOR SETTINGS: Vent Mode:  [-] PRVC FiO2 (%):  [28 %-100 %] 100 % Set  Rate:  [18 bmp] 18 bmp Vt Set:  [500 mL] 500 mL PEEP:  [5 cmH20] 5 cmH20 Plateau Pressure:  [28 cmH20] 28 cmH20  INTAKE / OUTPUT: Intake/Output      01/13 0701 - 01/14 0700   I.V. (mL/kg) 20 (0.1)   Total Intake(mL/kg) 20 (0.1)   Urine (mL/kg/hr) 800 (0.2)   Total Output 800   Net -780        PHYSICAL EXAMINATION: General: Obese Neuro:  Somnolent, withdraws to painful stimuli.  Cardiovascular:  RRR, (+) systolic murmur. Lungs:  Distant breath sounds Abdomen: Obese, (+) BS. Nontender, nondistended. Body habitus precludes assessment of organomegaly. Intertriginous erythema, superficial skin breakdown under pannus. Musculoskeletal:  Moves all 4 ext, BLE pitting edema and cellulitis noted per distal LE bilaterally.   LABS:  Lab 11/03/12 0254 11/03/12 0020 11/02/12 1359 11/02/12 1225  HGB -- -- -- 12.4  WBC -- -- -- 9.5  PLT -- -- -- 217  NA -- -- -- 137  K -- -- -- 4.5  CL -- -- -- 100  CO2 -- -- -- 28  GLUCOSE -- -- -- 133*  BUN -- -- -- 23  CREATININE -- -- -- 0.62  CALCIUM -- -- -- 8.4  MG -- -- -- --  PHOS -- -- -- --  AST -- -- -- --  ALT -- -- -- --  ALKPHOS -- -- -- --  BILITOT -- -- -- --  PROT -- -- -- --  ALBUMIN -- -- -- --  INR -- -- 3.17* --  APTT -- -- -- --  INR -- -- 3.17* --  LATICACIDVEN -- -- -- --  TROPONINI -- -- -- --  PHART 7.143* 7.112* -- --  PCO2ART 89.4* 98.2* -- --  PO2ART 106.0* 73.7* -- --  HCO3 30.6* 30.0* -- --  O2SAT 96.0 89.3 -- --   IMAGING:  1/13  PCXR >>> Bilateral airspace disease, left hilar prominence (mass vs dilated PA seen on priviosly performed chest CT). 1/13  Head CT >>>  No acute findings. 1/13  Neck CT >>>  No acute findings.   DIAGNOSES: Principal Problem:  *Cellulitis Active Problems:  Personal history of pulmonary embolism  Acute encephalopathy  Acute-on-chronic respiratory failure  Morbid obesity  Hypotension  OSA (obstructive sleep apnea)  Obesity hypoventilation syndrome   ASSESSMENT /  PLAN:  PULMONARY  Lab 11/03/12 0604 11/03/12 0254 11/03/12 0020  PHART 7.310* 7.143* 7.112*  PCO2ART 60.1* 89.4* 98.2*  PO2ART 221.0* 106.0* 73.7*  HCO3 30.6* 30.6* 30.0*  TCO2 32 33 33.0   A:  1) VDRF secondary to acute on chronic hypercarbic respiratory failure in setting of untreated OSA/OHS, chronic opioids and bilateral airspace disease - remained hypercarbic on bipap, therefore, intubated on 1/13. 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  3) Pneumonia (CAP vs aspiration), less likely pulmonary edema 4) Suspected pulmonary hypertension - in setting of uncontrolled OSA 5) Possible lung mass - noted per admission CXR, not noted on prior CT scans. Nonsmoker.  P:   - Coumadin per pharmacy. - Consider CT chest when stable to assess this questionable mass.  - Continue antibiotics - currently on Zosyn, Vanc, Diflucan until source of infection is better declared.    CARDIOVASCULAR No results found for this basename: CKTOTAL:3,CKMB:3,TROPONINI:3 in the last 168 hours No results found for this basename: PROBNP:3 in the last 168 hours A: 1) Hypotension with history of HTN - likely secondary to sepsis.  2) Chronic coumadin therapy 2/2 PE - INR therapeutic on admission although supratherapeutic this AM - likely 2/2 antibiotics.  P:  - Hold home Univasc / Metoprolol. - MAP goal > 65 mmHg. - Blood pressures / HR stable, therefore, will attempt to wean dopamine gtt. - Coumadin per pharmacy.  - CVP monitoring to help assess volume status - although likely not accurate given presumed pulm HTN.  - See ID section.   RENAL  Lab 11/03/12 0525 11/02/12 1225  NA 137 137  K 5.0 4.5  CL 100 100  CO2 27 28  BUN 24* 23  CREATININE 0.80 0.62  GLUCOSE 141* 133*   A: Normal renal function, appears euvolemic now. P:  - None.   GASTROINTESTINAL No results found for this basename:  AST:3,ALT:3,ALKPHOS:3,BILITOT:3,PROT:3,ALBUMIN:3 in the last 168 hours A: No acute issues. P:  - None.   HEMATOLOGIC  Lab 11/03/12 0525 11/02/12 1359 11/02/12 1225  HGB 12.1 -- 12.4  HCT 38.8 -- 38.7  WBC 10.1 -- 9.5  PLT 210 -- 217  APTT -- -- --  INR 4.32* 3.17* --   A:  1) Chronic coumadin in setting of chronic PE - supratherapeutic on admit. Worsening as Abx were started.  P:  - Coumadin per pharmacy.   INFECTIOUS  Lab 11/03/12 0525  LATICACIDVEN 0.7  PROCALCITON 0.12   A:  1) SIRS/ Sepsis secondary to cellulitis and possible CAP versus aspiration PNA  P:  - Pending blood cultures x 2 - Check  urine step antigen - Check for sputum culture from tracheal aspirate. - Continue empiric treatment for CAP/Aspiration PNA and cellulitis with vancomycin and Unasyn and Diflucan. - DC Zosyn. - CT chest to evaluate this questionable Left lung mass and airway disease.  - If CT chest negative for consolidation indicating PNA, will plan to narrow ABx.   ENDOCRINE  Lab 11/03/12 0127 11/02/12 2323  GLUCAP 122* 129*   A:  1) Prediabetes - A1c 6.3 P:  - ICU hyperglycemia protocol - Will need diabetes RN to provide education   NEUROLOGIC / PSYCHIATRIC  A:  1) Acute encephalopathy secondary to sepsis and acute on chronic hypercarbic respiratory failure - remains somnolent. CT head negative. Question of this could be contributed by chronic narcotics - although equivocal response to narcan indicates otherwise. P:  - CAM-ICU assessments.   GLOBAL: A: - To remain in ICU today while acute component of hypercarbic respiratory failure resolves - already improved.  - Social situation: The patient's pastor is at bedside and he indicates that she was if her usual state of health even to yesterday. She lives alone and is not able to well take care of herself. She has had left hip pain over last 2 weeks, which limits her ambulatory capacity. She has no close family (is widowed and  has a step-granddaughter with whom there is a strained relationship).  P: - SW consult for assessment for SNF eligibility, possible LTAC depending on progress.   CLINICAL SUMMARY: 67 yo with history of PE (on Coumadin), OSA (noncompliant with BiPAP), on intermittent home oxygen and chronic opioids brought to ED 1/13 after slipping and falling at home. Admitted on 1/13 for acute on chronic cellulitis.  Acute hypercarbic respiratory failure on 1/14 without significant improvement on bipap. Transferred to ICU on 1/14 and intubated. Will continue current therapy, will check CT chest to evaluate for airspace disease and questionable left lung mass.  Signed: Johnette Abraham, Roma Schanz, Internal Medicine Resident Pager: [phone] (7PM-7AM)  Care during the described time interval was provided by me and/or other providers on the critical care team.  I have reviewed this patient's available data, including medical history, events of note, physical examination and test results as part of my evaluation  ALVA,RAKESH V.  11/03/2012, 7:38 AM

## 2012-11-03 NOTE — Progress Notes (Signed)
ANTICOAGULATION & ANTIBIOTIC CONSULT NOTE - Initial Consult  Pharmacy Consult for Levofloxacin Indication: Empiric   No Known Allergies  Patient Measurements: Height: 5\' 6"  (167.6 cm) Weight: 354 lb 15.1 oz (161 kg) IBW/kg (Calculated) : 59.3   Vital Signs: Temp: 97.6 F (36.4 C) (01/14 0142) Temp src: Oral (01/14 0142) BP: 90/33 mmHg (01/14 0339) Pulse Rate: 80  (01/14 0339)  Labs:  Basename 11/02/12 1359 11/02/12 1225  HGB -- 12.4  HCT -- 38.7  PLT -- 217  APTT -- --  LABPROT 30.8* --  INR 3.17* --  HEPARINUNFRC -- --  CREATININE -- 0.62  CKTOTAL -- --  CKMB -- --  TROPONINI -- --    Estimated Creatinine Clearance: 106.3 ml/min (by C-G formula based on Cr of 0.62).   Medical History: Past Medical History  Diagnosis Date  . HTN (hypertension)   . Hyperlipemia   . GERD (gastroesophageal reflux disease)   . Obesity   . Sleep apnea     she has not been on CPAP  . PE (pulmonary embolism) 05/2011  . DOE (dyspnea on exertion) 01/22/2012  . Hematuria 01/22/2012    Assessment: 67 y.o. F known to pharmacy from vancomycin / Zosyn / warfarin management. Pharmacy now to manage levofloxacin.   Plan:  1. Levofloxacin 750mg  IV Q24H.  Lorre Munroe, PharmD 11/03/2012 3:40 AM

## 2012-11-03 NOTE — Consult Note (Addendum)
WOC consult Note Reason for Consult: Consult requested for legs and abdomen.  Bilat legs with generalized edema and erythremia, no open wounds or drainage, no topical treatment indicated at this time. Wound type: Pt obese, pannus folds excoriated and moist with extensive areas of partial thickness skin loss consistent with intertrigo. Pressure Ulcer POA: Rolled pt over to assess for presence of pressure ulcers, none noted to posterior back, sacrum, buttocks, since pt was found after being down for unknown amt of time. On Sport low air loss bed to reduce pressure. Wound bed: Partial thickness wounds in abd red and painful Drainage (amount, consistency, odor) , strong odor, mod yellow drainage. Dressing procedure/placement/frequency: Interdry fabric to wick away moisture and provide antimicrobial benefits to promote healing.  Instructions for use left at bedside for staff nurses. This fabric needs to remain in place for 5 days to be fully effective. Will not plan to follow further unless re-consulted.  [address], RN, MSN, [employer]  [phone]

## 2012-11-03 NOTE — H&P (Addendum)
PULMONARY  / CRITICAL CARE MEDICINE  Name: Kathleen [name] MRN: [id number] DOB: 1946-01-04    LOS: 1  REFERRING MD : TRH  CHIEF COMPLAINT:  Hypercapnic respiratory failure  BRIEF PATIENT DESCRIPTION:  67 yo with history of PE (therapeutic INR on Coumadin), OSA (noncompliant with BiPAP), on home oxygen and chronic opioids brought to ED 1/13 after slipping and falling at home. Admitted for chronic cellulitis.  Unresponsive / hypercarbic on the floor.  Transferred to ICU.  LINES / TUBES: Foley 1/13 >>>  CULTURES: 1/3  Blood >>>  ANTIBIOTICS: Clindamycin 1/13 x 1 Vancomycin 1/13 >>> Zosyn 1/13 >>> Diflucan 1/13>>  SIGNIFICANT EVENTS:  1/13 Admitted for cellulitis, transferred to ICU for hypercarbic respiratory failure  LEVEL OF CARE:  ICU  PRIMARY SERVICE:  PCCM  CONSULTANTS:  None  CODE STATUS: Full  DIET:  NPO  DVT Px:  Not indicated (therapeutic Coumadin)  GI Px:  Protonix  The patient is encephalopathic and unable to provide history, which was obtained for available medical records.  HISTORY OF PRESENT ILLNESS: 67 yo with history of PE (therapeutic INR on Coumadin), OSA (noncompliant with BiPAP), on home oxygen and chronic opioids brought to ED 1/13 after slipping and falling at home. Admitted for chronic cellulitis.  Unresponsive / hypercarbic on the floor.  Transferred to ICU.  PAST MEDICAL HISTORY :  Past Medical History  Diagnosis Date  . HTN (hypertension)   . Hyperlipemia   . GERD (gastroesophageal reflux disease)   . Obesity   . Sleep apnea     she has not been on CPAP  . PE (pulmonary embolism) 05/2011  . DOE (dyspnea on exertion) 01/22/2012  . Hematuria 01/22/2012   Past Surgical History  Procedure Date  . Total abdominal hysterectomy w/ bilateral salpingoophorectomy    Prior to Admission medications   Medication Sig Start Date End Date Taking? Authorizing Provider  atorvastatin (LIPITOR) 40 MG tablet Take 40 mg by mouth at bedtime.    Yes Historical  Provider, MD  cholecalciferol (VITAMIN D) 1000 UNITS tablet Take 4,000 Units by mouth daily.   Yes Historical Provider, MD  ezetimibe (ZETIA) 10 MG tablet Take 10 mg by mouth at bedtime.    Yes Historical Provider, MD  LORazepam (ATIVAN) 0.5 MG tablet Take 0.5 mg by mouth every 12 (twelve) hours as needed. For anxiety   Yes Historical Provider, MD  metoprolol succinate (TOPROL-XL) 50 MG 24 hr tablet Take 50 mg by mouth daily. Take with or immediately following a meal.   Yes Historical Provider, MD  moexipril (UNIVASC) 15 MG tablet Take 15 mg by mouth daily.    Yes Historical Provider, MD  omeprazole (PRILOSEC) 20 MG capsule Take 20 mg by mouth daily.   Yes Historical Provider, MD  warfarin (COUMADIN) 5 MG tablet Take 5 mg by mouth See admin instructions. 5mg  daily Except 7.5mg  Tuesdays and Thursdays only   Yes Historical Provider, MD  clindamycin (CLEOCIN) 150 MG capsule Take 2 capsules (300 mg total) by mouth 4 (four) times daily. May dispense as 150mg  capsules 11/02/12   [place] Nurse, PA-C  clotrimazole (LOTRIMIN) 1 % cream Apply to affected areas twice daily. 11/02/12   [place] Nurse, PA-C  fluconazole (DIFLUCAN) 150 MG tablet 150 mg PO once a week for four weeks 11/02/12   [place] Nurse, PA-C  oxyCODONE-acetaminophen (PERCOCET/ROXICET) 5-325 MG per tablet Take 1 tablet by mouth every 4 (four) hours as needed for pain. May take 2 tablets PO q 6 hours for severe  pain - Do not take with Tylenol as this tablet already contains tylenol 11/02/12   [place] Nurse, PA-C   No Known Allergies  FAMILY HISTORY:  Family History  Problem Relation Age of Onset  . Adopted: Yes   SOCIAL HISTORY:  reports that she has never smoked. She has never used smokeless tobacco. She reports that she does not drink alcohol or use illicit drugs.  REVIEW OF SYSTEMS:  Unable to further assess ROS 2/2 pt's unresponsiveness.   INTERVAL HISTORY:   VITAL SIGNS: Temp:  [97.4 F (36.3 C)-97.7 F (36.5 C)] 97.7 F (36.5 C)  (01/13 2327) Pulse Rate:  [54-80] 54  (01/13 2327) Resp:  [20-24] 22  (01/13 2327) BP: (87-100)/(42-58) 95/44 mmHg (01/13 2327) SpO2:  [88 %-94 %] 88 % (01/13 2327) FiO2 (%):  [28 %] 28 % (01/13 2009) Weight:  [366 lb 12.8 oz (166.379 kg)] 366 lb 12.8 oz (166.379 kg) (01/13 2029) HEMODYNAMICS:   VENTILATOR SETTINGS: Vent Mode:  [-]  FiO2 (%):  [28 %] 28 %  INTAKE / OUTPUT: Intake/Output      01/13 0701 - 01/14 0700   Urine (mL/kg/hr) 800 (0.2)   Total Output 800   Net -800        PHYSICAL EXAMINATION: General: Obese Neuro:  Appears confused, grunting HEENT:  NCAT Cardiovascular:  RRR Lungs:  Distant breath sounds Abdomen: Obese,  Musculoskeletal:  Moves all 4 ext, BLE pitting edema Skin: Large pannus with erythema  LABS:  Lab 11/03/12 0254 11/03/12 0020 11/02/12 1359 11/02/12 1225  HGB -- -- -- 12.4  WBC -- -- -- 9.5  PLT -- -- -- 217  NA -- -- -- 137  K -- -- -- 4.5  CL -- -- -- 100  CO2 -- -- -- 28  GLUCOSE -- -- -- 133*  BUN -- -- -- 23  CREATININE -- -- -- 0.62  CALCIUM -- -- -- 8.4  MG -- -- -- --  PHOS -- -- -- --  AST -- -- -- --  ALT -- -- -- --  ALKPHOS -- -- -- --  BILITOT -- -- -- --  PROT -- -- -- --  ALBUMIN -- -- -- --  INR -- -- 3.17* --  APTT -- -- -- --  INR -- -- 3.17* --  LATICACIDVEN -- -- -- --  TROPONINI -- -- -- --  PHART 7.143* 7.112* -- --  PCO2ART 89.4* 98.2* -- --  PO2ART 106.0* 73.7* -- --  HCO3 30.6* 30.0* -- --  O2SAT 96.0 89.3 -- --   IMAGING:  1/13  PCXR >>> Bilateral airspace disease, left hilar prominence (mass vs dilated PA seen on priviosly performed chest CT). 1/13  Head CT >>>  No acute findings. 1/13  Neck CT >>>  No acute findings.  ECG:  DIAGNOSES: Principal Problem:  *Cellulitis Active Problems:  Personal history of pulmonary embolism  Acute encephalopathy  Acute-on-chronic respiratory failure  Morbid obesity  Hypotension  OSA (obstructive sleep apnea)  Obesity hypoventilation  syndrome  ASSESSMENT / PLAN:  PULMONARY  A:  Acute hypercarbic respiratory failure in setting of untreated OSA/OHS, chronic opioids and bilateral airspace disease.  History of PE with therapeutic INR.  Pneumonia (CAP vs aspiration), less likely pulmonary edema.  Suspected pulmonary hypertension.  Possible lung mass. P:   Trial of BiPAP, may need intubation Narcan Recheck ABG in 1 hour Continue Coumadin Chest CT when stable  CARDIOVASCULAR  A:  H/o of HTN, but now hypotensive in setting  of apparent hypovolemia.  Possible DVT is setting of cellulitis. P:  Hold Univasc / Metoprolol Place CVL Venous Doppler LE  RENAL  A:  Normal renal function, appears hypovolemic. P:   CVP goal > 10 NS boluses PRN  GASTROINTESTINAL  A: GERD P:   Protonix NPO  HEMATOLOGIC  A:  Appropriately anticoagulated. P:  Coumadin per Pharmacy Trend CBC / INR  INFECTIOUS  A:  Acute on chronic cellulitis.  Possible pneumonia (CAP vs aspiration). P:   Antibiotics  / cultures as above PCT  ENDOCRINE  A:  No h/o DM, blood glucose stable since admission. P:   No interventions required  NEUROLOGIC  A:  Acute encephalopathy, likely secondary to hypercarbia. P:   Monitor for now  CLINICAL SUMMARY: 67 yo with history of PE (therapeutic INR on Coumadin), OSA (noncompliant with BiPAP), on home oxygen and chronic opioids brought to ED 1/13 after slipping and falling at home. Admitted for chronic cellulitis.  Unresponsive / hypercarbic on the floor.  Transferred to ICU.  BiPAP / Narcan and f/u ABG in 1 hour.  May need mechanical support.  Central line / fluids for CVP > 10.  Empirical abx for cellulitis / pneumonia.  Venous Doppler LE.  Continue Coumadin.  Chest CT when stable.  Genelle Gather , MD Internal Medicine Resident, PGY I [facility] Internal Medicine Program Pager: [phone]  11/03/2012 1:39 AM  I have personally obtained a history, examined the patient, evaluated laboratory and  imaging results, formulated the assessment and plan and placed orders.  CRITICAL CARE: The patient is critically ill with multiple organ systems failure and requires high complexity decision making for assessment and support, frequent evaluation and titration of therapies, application of advanced monitoring technologies and extensive interpretation of multiple databases. Critical Care Time devoted to patient care services described in this note is 45 minutes.   Lonia Farber, MD  Pulmonary and Critical Care Medicine [facility] Pager: [phone]  11/03/2012, 12:56 AM

## 2012-11-03 NOTE — Procedures (Signed)
Central Venous Catheter Insertion Procedure Note Kathleen [name] [id number] 1945/12/12  Procedure: Insertion of Central Venous Catheter Indications: Assessment of intravascular volume, Drug and/or fluid administration and Frequent blood sampling  Procedure Details Consent: Unable to obtain consent because of altered level of consciousness. Time Out: Verified patient identification, verified procedure, site/side was marked, verified correct patient position, special equipment/implants available, medications/allergies/relevent history reviewed, required imaging and test results available.  Performed  Maximum sterile technique was used including antiseptics, cap, gloves, gown, hand hygiene, mask and sheet. Skin prep: Chlorhexidine; local anesthetic administered A antimicrobial bonded/coated triple lumen catheter was placed in the left internal jugular vein using the Seldinger technique.  Initially attempted left subclavian but unable to cannulate the vein secondary to body habitus.  Area was reprepped and Left internal jugular vein was cannulated with ultrasound guidance.  Good blood return.   Evaluation Blood flow good Complications: No apparent complications Patient did tolerate procedure well. Chest X-ray ordered to verify placement.  CXR: pending.  Kathleen [name] 11/03/2012, 4:27 AM

## 2012-11-03 NOTE — Significant Event (Addendum)
Shift event:  This is a 67 yo obese WF with a hx of PE on coumadin, HTN, HLD, obesity, and OSA who came to the ED 11/02/12 after being found in the floor by her friend. ? Being down since 5 am. When she arrived to ED was found to have cellulitis. Admitted for tx. She has a hx of OSA but doesn't wear her CPAP at home. She does wear O2 on and off at home.  HPI: RN paged NP 2/2 change in pt's mental status. Rapid response at bedside. RN says she heard pt laughing and came into assess. Pt was very lethargic, but would arouse briefly to name calling then start laughing and return to being sleepy. BP is soft but has been all day. O2 sat 88% at one point, but stable at 94% with 4L [place].  NP to bedside.  S: can not do ROS 2/2 mental status.  O: BP soft but has been all day. O2 sat 91%, mouth breathing. Changed to ventimask at 50%. It is difficult to assess resp effort 2/2 obesity, but she has increased her respirations since NP to room. Pt is lethargic. She arouses briefly to name calling and tactile stimulation to her chest. She opens her eyes to name calling but laughs out loud each time she is aroused. She doesn't follow commands. PERRL. I do not see any focal neuro deficits. No facial droop. S1S2, RRR.  A/P: 1. Change in mental status-considered CT head, but no focal deficits. High suspicion for hypoxia given obesity and her home hx of O2 with hx of OSA and non compliance on CPAP. ABG revealed pH 7.11, high CO2, low PO2. Pt was immediately transferred to ICU and PCCM consult called for ? Intubation and evaluation. Dr. Herma Carson, PCCM, in ICU when we arrived. He will assume pt's care.  Pt has no family/next of kin.  Jimmye Norman, NP [facility]

## 2012-11-03 NOTE — Progress Notes (Signed)
Called by rapid response RN concerning patient on 6N who is lathargic with low SPO2 levels.  ABG    Component Value Date/Time   PHART 7.112* 11/03/2012 0020   PCO2ART 98.2* 11/03/2012 0020   PO2ART 73.7* 11/03/2012 0020   HCO3 30.0* 11/03/2012 0020   TCO2 33.0 11/03/2012 0020   O2SAT 89.3 11/03/2012 0020      Patient placed on BIPAP 100% and transferred to 2115 without issue, report given.

## 2012-11-03 NOTE — Procedures (Signed)
Arterial Catheter Insertion Procedure Note Kathleen [name] [id number] 09/09/1946  Procedure: Insertion of Arterial Catheter  Indications: Blood pressure monitoring  Procedure Details Consent: Unable to obtain consent because of emergent medical necessity. Time Out: Verified patient identification, verified procedure, site/side was marked, verified correct patient position, special equipment/implants available, medications/allergies/relevent history reviewed, required imaging and test results available.  Performed  Maximum sterile technique was used including antiseptics, gloves, gown, hand hygiene, mask and sheet. Skin prep: Chlorhexidine; local anesthetic administered 20 gauge catheter was inserted into left radial artery using the Seldinger technique.  Evaluation Blood flow good; BP tracing good. Complications: No apparent complications.   Clearnce Sorrel [facility] 11/03/2012

## 2012-11-03 NOTE — Progress Notes (Signed)
Called around 2330 by bedside RN stating patient was lethargic with inappropriate responses. Upon arrival to room, patient lying in bed, opens eyes to voice, states "I just don't know" when asked any questions. Unable to follow simple commands except for stick out your tongue, no focal neuro deficits noted. Patient would quickly fall back asleep and begin laughing inappropriately with occasional grunts. HR 70 SBP 90s RR 22, 94% 4LNC, patient began to mouth breath more and increased respiratory rate during assessment. Patient placed on 50% venti mask and ABG obtained. NP and RT at bedside. Patient transferred to ICU for BIPAP and possible intubation. Patient's friend notified by nursing staff.

## 2012-11-03 NOTE — Progress Notes (Signed)
eLink Physician-Brief Progress Note Patient Name: Kathleen [name] DOB: Jun 03, 1946 MRN: [id number]  Date of Service  11/03/2012   HPI/Events of Note   Hypotensive MAP 50. SBP 80s. ECHO in April 2013 had ef 55%. CXR lnopw ooks congested v pna now. Had cellulitis at admit 11/02/2012   eICU Interventions  Fluid bolus 1L Check pct and lactic acid       Rafael Quesada 11/03/2012, 2:46 AM

## 2012-11-03 NOTE — Progress Notes (Signed)
Clinical Social Work Department BRIEF PSYCHOSOCIAL ASSESSMENT 11/03/2012  Patient:  Kathleen [name], STACHNIK     Account Number:  [id number]     Admit date:  11/02/2012  Clinical Social Worker:  Dennison Bulla  Date/Time:  11/03/2012 11:30 AM  Referred by:  Physician  Date Referred:  11/03/2012 Referred for  Other - See comment  SNF Placement   Other Referral:   Friends have questions regarding HCPOA   Interview type:  Other - See comment Other interview type:   Friends    PSYCHOSOCIAL DATA Living Status:  ALONE Admitted from facility:   Level of care:   Primary support name:  Dixie Primary support relationship to patient:  FRIEND Degree of support available:   Strong    CURRENT CONCERNS Current Concerns  Other - See comment  Post-Acute Placement   Other Concerns:   HCPOA    SOCIAL WORK ASSESSMENT / PLAN CSW received referral due to friends having questions regarding HCPOA. Per chart review, patient might need SNF placement at dc as well. CSW met with patient and friends at bedside.    CSW introduced myself and explained role. Patient intubated and unable to participate in assessment at this time. Friends present include pastor, his wife and patient's friend. Friends report that patient has no family other than her deceased husband's granddtr. Friends report concern that granddtr would not make decisions that patient would want. [name] reports that patient had a scheduled appointment today to complete living will and HCPOA but had to come to the hospital. CSW staffed case with director [facility]) who reports that granddaughter would not have final decision making and MD can rely on friends that know patient's wishes to make decisions as well. CSW informed family, CM and RN of director's advice.    CSW explained the difference btw HCPOA and durable POA. CSW will continue to follow and can complete HCPOA with patient if she is alert and oriented. CSW will continue to follow to  assist with dc planning.   Assessment/plan status:  Psychosocial Support/Ongoing Assessment of Needs Other assessment/ plan:   Information/referral to community resources:   HCPOA vs durable POA  SNF    PATIENT'S/FAMILY'S RESPONSE TO PLAN OF CARE: Patient unable to participate in assessment. Friends appreciative of CSW consult and agreeable to follow up visits.

## 2012-11-03 NOTE — Procedures (Signed)
Supervised, assisted and present through the entire procedure.  Orlean Bradford, M.D., F.C.C.P. Pulmonary and Critical Care Medicine [facility] Cell: [phone] Pager: [phone]

## 2012-11-03 NOTE — Care Management Note (Signed)
    Page 1 of 1   11/13/2012     3:00:36 PM   CARE MANAGEMENT NOTE 11/13/2012  Patient:  Kathleen [name], Kathleen [name]   Account Number:  [id number]  Date Initiated:  11/03/2012  Documentation initiated by:  [facility]  Subjective/Objective Assessment:   resp failure - requiring intubation.  Lives at home alone.     Action/Plan:   Anticipated DC Date:  11/10/2012   Anticipated DC Plan:  SKILLED NURSING FACILITY  In-house referral  Clinical Social Worker      DC Planning Services  CM consult      Choice offered to / List presented to:             Status of service:  Completed, signed off Medicare Important Message given?   (If response is "NO", the following Medicare IM given date fields will be blank) Date Medicare IM given:   Date Additional Medicare IM given:    Discharge Disposition:  SKILLED NURSING FACILITY  Per UR Regulation:  Reviewed for med. necessity/level of care/duration of stay  If discussed at Long Length of Stay Meetings, dates discussed:    Comments:  11/09/12- 1600- [name] RN, BSN [phone] Pt for d/c to SNF today- CSW following for placement needs.  11-03-12 4:20pm [name],  RNBSN [phone] Lives at home alone. Only family is Step grandaughter. [name] and close friend in room.  Plan was to talk to patient about lliving will prior to being intubated.  SW to talk with family - consult placed.  Depending on progression - may be Ltach candidate.

## 2012-11-04 ENCOUNTER — Inpatient Hospital Stay (HOSPITAL_COMMUNITY): Payer: Medicare Other

## 2012-11-04 DIAGNOSIS — G4733 Obstructive sleep apnea (adult) (pediatric): Secondary | ICD-10-CM

## 2012-11-04 LAB — BLOOD GAS, ARTERIAL
Acid-Base Excess: 3.2 mmol/L — ABNORMAL HIGH (ref 0.0–2.0)
Bicarbonate: 29 mEq/L — ABNORMAL HIGH (ref 20.0–24.0)
FIO2: 50 %
O2 Saturation: 97 %
Patient temperature: 97.6
TCO2: 30.9 mmol/L (ref 0–100)
pO2, Arterial: 92.1 mmHg (ref 80.0–100.0)

## 2012-11-04 LAB — COMPREHENSIVE METABOLIC PANEL
BUN: 14 mg/dL (ref 6–23)
CO2: 27 mEq/L (ref 19–32)
Chloride: 105 mEq/L (ref 96–112)
Creatinine, Ser: 0.48 mg/dL — ABNORMAL LOW (ref 0.50–1.10)
GFR calc non Af Amer: >90 mL/min (ref >90)
Total Bilirubin: 0.3 mg/dL (ref 0.3–1.2)

## 2012-11-04 LAB — POCT I-STAT 3, ART BLOOD GAS (G3+)
Acid-Base Excess: 1 mmol/L (ref 0.0–2.0)
O2 Saturation: 87 %
Patient temperature: 98
TCO2: 34 mmol/L (ref 0–100)

## 2012-11-04 LAB — PROTIME-INR
INR: 3.91 — ABNORMAL HIGH (ref 0.00–1.49)
Prothrombin Time: 36 seconds — ABNORMAL HIGH (ref 11.6–15.2)

## 2012-11-04 LAB — GLUCOSE, CAPILLARY
Glucose-Capillary: 108 mg/dL — ABNORMAL HIGH (ref 70–99)
Glucose-Capillary: 104 mg/dL — ABNORMAL HIGH (ref 70–99)
Glucose-Capillary: 86 mg/dL (ref 70–99)
Glucose-Capillary: 92 mg/dL (ref 70–99)

## 2012-11-04 LAB — BASIC METABOLIC PANEL
BUN: 16 mg/dL (ref 6–23)
CO2: 31 mEq/L (ref 19–32)
Chloride: 104 mEq/L (ref 96–112)
Creatinine, Ser: 0.59 mg/dL (ref 0.50–1.10)

## 2012-11-04 LAB — CBC
HCT: 39 % (ref 36.0–46.0)
MCH: 30.7 pg (ref 26.0–34.0)
MCV: 95.1 fL (ref 78.0–100.0)
RBC: 4.1 MIL/uL (ref 3.87–5.11)
RDW: 16 % — ABNORMAL HIGH (ref 11.5–15.5)
WBC: 9 10*3/uL (ref 4.0–10.5)

## 2012-11-04 LAB — URINE CULTURE: Culture: NO GROWTH

## 2012-11-04 MED ORDER — ADULT MULTIVITAMIN LIQUID CH
5.0000 mL | Freq: Every day | ORAL | Status: DC
  Administered 2012-11-04 – 2012-11-09 (×6): 5 mL
  Filled 2012-11-04 (×6): qty 5

## 2012-11-04 MED ORDER — FUROSEMIDE 10 MG/ML IJ SOLN
40.0000 mg | Freq: Once | INTRAMUSCULAR | Status: AC
  Administered 2012-11-04: 40 mg via INTRAVENOUS
  Filled 2012-11-04: qty 4

## 2012-11-04 MED ORDER — SODIUM CHLORIDE 0.9 % IV SOLN
1.5000 g | Freq: Four times a day (QID) | INTRAVENOUS | Status: AC
  Administered 2012-11-04 – 2012-11-09 (×21): 1.5 g via INTRAVENOUS
  Filled 2012-11-04 (×24): qty 1.5

## 2012-11-04 MED ORDER — FENTANYL CITRATE 0.05 MG/ML IJ SOLN
25.0000 ug | INTRAMUSCULAR | Status: DC | PRN
  Administered 2012-11-05: 50 ug via INTRAVENOUS
  Administered 2012-11-05: 100 ug via INTRAVENOUS
  Filled 2012-11-04 (×2): qty 2

## 2012-11-04 MED ORDER — PRO-STAT SUGAR FREE PO LIQD
60.0000 mL | Freq: Four times a day (QID) | ORAL | Status: DC
  Administered 2012-11-04 – 2012-11-06 (×8): 60 mL
  Filled 2012-11-04 (×11): qty 60

## 2012-11-04 MED ORDER — FUROSEMIDE 10 MG/ML IJ SOLN: 40.0000 mg | Freq: Once | INTRAMUSCULAR | Status: DC

## 2012-11-04 MED ORDER — NYSTATIN 100000 UNIT/GM EX POWD
Freq: Three times a day (TID) | CUTANEOUS | Status: DC
  Administered 2012-11-04 – 2012-11-09 (×16): via TOPICAL
  Filled 2012-11-04 (×2): qty 15

## 2012-11-04 MED ORDER — OSMOLITE 1.5 CAL PO LIQD
1000.0000 mL | ORAL | Status: DC
  Administered 2012-11-04 – 2012-11-05 (×2): 1000 mL
  Filled 2012-11-04 (×4): qty 1000

## 2012-11-04 MED ORDER — PANTOPRAZOLE SODIUM 40 MG PO PACK
40.0000 mg | PACK | Freq: Every day | ORAL | Status: DC
  Administered 2012-11-04 – 2012-11-06 (×3): 40 mg
  Filled 2012-11-04 (×3): qty 20

## 2012-11-04 NOTE — Progress Notes (Signed)
INITIAL NUTRITION ASSESSMENT  DOCUMENTATION CODES Per approved criteria  -Morbid Obesity   INTERVENTION:  Initiate TF via OG tube with Osmolite 1.5 at 10 ml/h, increase by 10 ml every 4 hours to goal rate of 20 ml/h with Prostat 60 ml QID to provide 1520 kcals (25 kcals/kg ideal weight and 70% of estimated needs), 150 gm protein (100% estimated needs), 366 ml free water daily.  Liquid MVI daily via tube to help meet 100% DRI's.  NUTRITION DIAGNOSIS: Inadequate oral intake related to inability to eat as evidenced by NPO status.   Goal: Enteral nutrition to provide 60-70% of estimated calorie needs (22-25 kcals/kg ideal body weight) and >/= 90% of estimated protein needs, based on ASPEN guidelines for permissive underfeeding in critically ill obese individuals.  Monitor:  TF tolerance/adequacy, weight trend, labs, I/O  Reason for Assessment: VDRF; Per discussion in rounds, RD to order TF  67 y.o. female  Admitting Dx: Cellulitis; Hypercapnic respiratory failure  ASSESSMENT: Patient was brought to the ED 1/13 after slipping and falling at home. Admitted for chronic cellulitis. Unresponsive / hypercarbic on 1/13 PM, therefore transferred to ICU 1/14 AM. Intubated 1/14 due to persistent hypercarbia despite BiPAP therapy.  Discussed patient in ICU rounds this morning.  No plans for extubation today.  RD to order TF.  OG tube is in place.  Patient is currently intubated on ventilator support.  MV: 9.1 Temp:Temp (24hrs), Avg:97.9 F (36.6 C), Min:97.5 F (36.4 C), Max:98.2 F (36.8 C)    Height: Ht Readings from Last 1 Encounters:  11/03/12 5\' 6"  (1.676 m)    Weight: Wt Readings from Last 1 Encounters:  11/03/12 354 lb 15.1 oz (161 kg)    Ideal Body Weight: 59.1 kg  % Ideal Body Weight: 272%  Wt Readings from Last 10 Encounters:  11/03/12 354 lb 15.1 oz (161 kg)  02/03/12 333 lb 1.6 oz (151.093 kg)  01/22/12 319 lb 4.8 oz (144.834 kg)    Usual Body Weight: 319  lb  % Usual Body Weight: 111%  BMI:  Body mass index is 57.29 kg/(m^2).  Estimated Nutritional Needs: Kcal: 2160 Protein: >145 gm Fluid: 2.2 L  Skin: superficial skin breakdown under pannus, BLE pitting edema and cellulitis on bilateral LE.  Diet Order:  NPO  EDUCATION NEEDS: -Education not appropriate at this time   Intake/Output Summary (Last 24 hours) at 11/04/12 1330 Last data filed at 11/04/12 1300  Gross per 24 hour  Intake 1837.5 ml  Output   3210 ml  Net -1372.5 ml    Labs:   Lab 11/04/12 0505 11/03/12 0525 11/02/12 1225  NA 141 137 137  K 4.6 5.0 4.5  CL 105 100 100  CO2 27 27 28   BUN 14 24* 23  CREATININE 0.48* 0.80 0.62  CALCIUM 8.4 8.3* 8.4  MG -- -- --  PHOS -- -- --  GLUCOSE 105* 141* 133*    CBG (last 3)   Basename 11/04/12 1206 11/04/12 0846 11/04/12 0348  GLUCAP 94 98 104*    Scheduled Meds:   . ampicillin-sulbactam (UNASYN) 1.5 g IVPB  1.5 g Intravenous Q6H  . antiseptic oral rinse  15 mL Mouth Rinse QID  . chlorhexidine  15 mL Mouth Rinse BID  . insulin aspart  2-6 Units Subcutaneous Q4H  . nystatin   Topical TID  . pantoprazole sodium  40 mg Per Tube Daily  . sodium chloride  3 mL Intravenous Q12H  . Warfarin - Pharmacist Dosing Inpatient   Does  not apply q1800    Continuous Infusions:   . DOPamine 8 mcg/kg/min (11/04/12 1300)  . fentaNYL infusion INTRAVENOUS Stopped (11/04/12 1200)    Past Medical History  Diagnosis Date  . HTN (hypertension)   . Hyperlipemia   . GERD (gastroesophageal reflux disease)   . Obesity   . Sleep apnea     she has not been on CPAP  . PE (pulmonary embolism) 05/2011  . DOE (dyspnea on exertion) 01/22/2012  . Hematuria 01/22/2012    Past Surgical History  Procedure Date  . Total abdominal hysterectomy w/ bilateral salpingoophorectomy     Joaquin Courts, RD, LDN, CNSC Pager# [phone] After Hours Pager# [phone]

## 2012-11-04 NOTE — Progress Notes (Signed)
PULMONARY  / CRITICAL CARE MEDICINE   Name: Kathleen [name] MRN: [id number] DOB: 01/01/1946    LOS: 2  REFERRING MD : [facility]  CHIEF COMPLAINT:  Hypercapnic respiratory failure  BRIEF PATIENT DESCRIPTION:  67 yo with history of PE (therapeutic INR on Coumadin), OSA (noncompliant with BiPAP), on home oxygen and chronic opioids brought to ED 1/13 after slipping and falling at home. Admitted for chronic cellulitis.  Unresponsive / hypercarbic on 1/13 PM, therefore transferred to ICU 1/14 AM. Intubated 1/14 2/2 persistent hypercarbia despite BiPAP therapy.  LINES / TUBES: Foley 1/13 >>> Left IJ 1/14 >>>  CULTURES: 1/14 - Blood culture >>>  1/14 - Urine culture >>> 1/14 - Respiratory culture >>> 1/14 - S.pneumo antigen >> negative  ANTIBIOTICS: Clindamycin 1/13 x 1 Vancomycin 1/13 >>1/15 Zosyn 1/13 >>1/14 Diflucan 1/13 >>1/15 Unasyn 1/15 >>> Nystatin powder 1/15 >>>    SIGNIFICANT EVENTS:  1/13 - Admitted for cellulitis, transferred to ICU for hypercarbic respiratory failure 1/14 - Remains intubated, hypercarbia improving. SW consulted to help start search for SNF.   LEVEL OF CARE:  ICU PRIMARY SERVICE:  PCCM CONSULTANTS:  None CODE STATUS: Full DIET:  NPO DVT Px:  Not indicated (therapeutic Coumadin) GI Px:  Protonix    SUBJECTIVE/ INTERVAL HISTORY:  Patient more interactive this AM and is able to communicate with me through nodding. She indicates no pain, shortness of breath, nausea, chest pain. - Remains afebrile overnight - Remains on DA gtt - was attempted to decrease to 8 overnight, however, had MAPs in 50s with this --> therefore escalated again.   VITAL SIGNS: Temp:  [97.3 F (36.3 C)-98.2 F (36.8 C)] 97.8 F (36.6 C) (01/15 0349) Pulse Rate:  [78-106] 83  (01/15 0500) Resp:  [15-24] 17  (01/15 0500) BP: (70-130)/(35-80) 109/56 mmHg (01/15 0500) SpO2:  [91 %-96 %] 96 % (01/15 0355) Arterial Line BP: (77-137)/(45-77) 123/61 mmHg (01/15  0500) FiO2 (%):  [40 %-50 %] 50 % (01/15 0600) HEMODYNAMICS:   VENTILATOR SETTINGS: Vent Mode:  [-] PRVC FiO2 (%):  [40 %-50 %] 50 % Set Rate:  [18 bmp] 18 bmp Vt Set:  [500 mL] 500 mL PEEP:  [5 cmH20] 5 cmH20 Plateau Pressure:  [21 cmH20-31 cmH20] 31 cmH20  INTAKE / OUTPUT: Intake/Output      01/14 0701 - 01/15 0700 01/15 0701 - 01/16 0700   I.V. (mL/kg) 1218.2 (7.6)    IV Piggyback 1100    Total Intake(mL/kg) 2318.2 (14.4)    Urine (mL/kg/hr) 4300 (1.1)    Total Output 4300    Net -1981.8          PHYSICAL EXAMINATION: General: Obese, NAD. More interactive this AM - able to nod appropriately to questioning. Neuro:  AAO, withdraws to painful stimuli. Answers appropriately. Cardiovascular:  RRR, (+) systolic murmur. Lungs:  Distant breath sounds Abdomen: Obese, (+) BS. Nontender, nondistended. Body habitus precludes assessment of organomegaly. Intertriginous erythema, superficial skin breakdown under pannus - slightly improved from prior. Musculoskeletal:  Moves all 4 ext, BLE pitting edema and cellulitis noted per distal LE bilaterally.   IMAGING:  1/15 - pCXR - Slight decrease in the bilateral effusions. Pulmonary vascularity is now normal. 1/14 - CT Chest  - 1. Moderate bilateral pleural effusions. 2. Associated dependent airspace disease. 3. Chronic superior endplate compression fracture of T4. 4. 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 1/13 - pCXR - Bilateral airspace disease, left hilar prominence (mass vs dilated PA seen on priviosly performed  chest CT). 1/13 - Head CT - No acute findings. 1/13 - Neck CT - No acute findings. Fusion at C2-3 level. Degenerative spondylosis at C3-4, C5-6, C6-7 and C7-T1.   DIAGNOSES: Principal Problem:  *Cellulitis Active Problems:  Personal history of pulmonary embolism  Acute encephalopathy  Acute-on-chronic respiratory failure  Morbid obesity  Hypotension  OSA (obstructive sleep apnea)  Obesity  hypoventilation syndrome   ASSESSMENT / PLAN:  PULMONARY  Lab 11/04/12 0455 11/03/12 0604 11/03/12 0254  PHART 7.308* 7.310* 7.143*  PCO2ART 59.3* 60.1* 89.4*  PO2ART 92.1 221.0* 106.0*  HCO3 29.0* 30.6* 30.6*  TCO2 30.9 32 33   A:  1) VDRF secondary to progressive chronic hypercarbic respiratory failure in setting of untreated OSA/OHS, chronic opioids? - remained hypercarbic on bipap, therefore, intubated on 1/13. Hypercarbia is back to presumed baseline. Mental status improved. Given PCT < 1, less likely CAP, therefore, ABX to be narrowed. 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  neumonia (CAP vs aspiration) 3) Suspected pulmonary hypertension - in setting of uncontrolled OSA 4) Possible lung mass - ruled out per CT chest. Will remove from problem list. Nonsmoker.  P:   - Coumadin per pharmacy. - SBT, ABG on 5/5 showed resp acidosis, will likely continue on Vent x 1 additional day. - Will obtain tracheal aspirate culture   CARDIOVASCULAR No results found for this basename: CKTOTAL:3,CKMB:3,TROPONINI:3 in the last 168 hours No results found for this basename: PROBNP:3 in the last 168 hours A: 1) Hypotension with history of HTN  2) Chronic coumadin therapy 2/2 PE - INR therapeutic on admission although supratherapeutic this AM - likely 2/2 antibiotics.  P:  - Continue to hold home Univasc / Metoprolol. - MAP goal > 65 mmHg. - Blood pressures / HR stable, therefore, will again attempt to wean dopamine gtt. - Coumadin per pharmacy.  - See ID section.   RENAL  Lab 11/04/12 0505 11/03/12 0525 11/02/12 1225  NA 141 137 137  K 4.6 5.0 4.5  CL 105 100 100  CO2 27 27 28   BUN 14 24* 23  CREATININE 0.48* 0.80 0.62  GLUCOSE 105* 141* 133*   A: Normal renal function, appears euvolemic now. P:  - None.   GASTROINTESTINAL  Lab 11/04/12 0505  AST 43*  ALT 32  ALKPHOS 94  BILITOT 0.3  PROT 7.0  ALBUMIN  2.3*   A: No acute issues. P:  - None.   HEMATOLOGIC  Lab 11/04/12 0505 11/03/12 0525 11/02/12 1359 11/02/12 1225  WBC 9.0 10.1 -- 9.5  HGB 12.6 12.1 -- 12.4  HCT 39.0 38.8 -- 38.7  PLT 234 210 -- 217  APTT -- -- -- --  INR 3.91* 4.32* 3.17* --    A:  1) Chronic coumadin in setting of chronic PE - supratherapeutic on admit. Worsening as Abx were started.  P:  - Coumadin per pharmacy.   INFECTIOUS  Lab 11/04/12 0505 11/03/12 0525 11/02/12 1225  WBC 9.0 10.1 9.5  NEUTROABS -- -- 7.0  LATICACIDVEN -- 0.7 --  PROCALCITON <0.10 0.12 --    A:  1) SIRS/ Sepsis secondary to cellulitis - CT chest cannot rule out airspace disease and does note BL pleural effusions. As PCT < 1, less convinced of CAP, therefore, will narrow Abx. P:  - Pending blood cultures x 2, sputum culture from tracheal aspirate. - Will DC Vanc. - Continue Unasyn - day 3 of likely 5-7. - DC  IV diflucan. - Start topical Nystatin.   ENDOCRINE  Lab 11/04/12 0348 11/04/12 0048 11/03/12 1935 11/03/12 1651 11/03/12 1213  GLUCAP 104* 108* 114* 120* 118*   A:  1) Prediabetes - A1c 6.3 P:  - ICU hyperglycemia protocol - Will need diabetes RN to provide education   NEUROLOGIC / PSYCHIATRIC  A:  1) Acute encephalopathy secondary to sepsis and acute on chronic hypercarbic respiratory failure - remains somnolent. CT head negative. Question of this could be contributed by chronic narcotics - although equivocal response to narcan indicates otherwise. P:  - CAM-ICU assessments.   GLOBAL: A: - Acute component of hypercarbic respiratory failure resolved/resolving. Mental status significantly improved.   - Social situation: The patient is able to nod to questions this AM. She does agree that she has difficulty with taking care of herself at home, contributing towards her deconditioning and acute on chronic cellulitis. She is open to SNF placement if she qualifies.   P: - Appreciate SW for assessment for SNF  eligibility, possible LTAC depending on progress.   CLINICAL SUMMARY: 67 yo with history of PE (on Coumadin), OSA (noncompliant with BiPAP), on intermittent home ) O2 admitted 1/13 for acute on chronic intertriginous cellulitis.  Had acute hypercarbic respiratory failure on 1/14 without improvement on bipap. Transferred to ICU on 1/14 and intubated. Overall is doing much better, acute component of hypercarbic respiratory failure is improved and mental status is stable. Minimal vent support at this time, will keep on vent for 1 additional day to permit additional diuresis. Start tube feeds.   Signed: Johnette Abraham, Roma Schanz, Internal Medicine Resident Pager: [phone] (7AM-5PM)  Care during the described time interval was provided by me and/or other providers on the critical care team.  I have reviewed this patient's available data, including medical history, events of note, physical examination and test results as part of my evaluation  CC time x  35 m  Chyenne Sobczak V.

## 2012-11-04 NOTE — Progress Notes (Signed)
ANTICOAGULATION CONSULT NOTE - Follow up  Pharmacy Consult for Warfarin Indication:  hx PE  No Known Allergies  Patient Measurements: Height: 5\' 6"  (167.6 cm) Weight: 354 lb 15.1 oz (161 kg) IBW/kg (Calculated) : 59.3   Labs:  Basename 11/04/12 0505 11/03/12 0525 11/02/12 1359 11/02/12 1225  HGB 12.6 12.1 -- --  HCT 39.0 38.8 -- 38.7  PLT 234 210 -- 217  APTT -- -- -- --  LABPROT 36.0* 38.7* 30.8* --  INR 3.91* 4.32* 3.17* --  HEPARINUNFRC -- -- -- --  CREATININE 0.48* 0.80 -- 0.62  CKTOTAL -- -- -- --  CKMB -- -- -- --  TROPONINI -- -- -- --   Estimated Creatinine Clearance: 106.3 ml/min (by C-G formula based on Cr of 0.48).  Medical History: Past Medical History  Diagnosis Date  . HTN (hypertension)   . Hyperlipemia   . GERD (gastroesophageal reflux disease)   . Obesity   . Sleep apnea     she has not been on CPAP  . PE (pulmonary embolism) 05/2011  . DOE (dyspnea on exertion) 01/22/2012  . Hematuria 01/22/2012    Assessment: 66 yoF presenting 11/02/12 after a fall. Pharmacy consulted to manage warfarin. Pt home dose and compliance confirmed 11/04/11 with outpatient team at [facility].   INR 3.91 remains SUPRAtherapeutic .   Several antibiotics discontinued today, Unasyn still on board  No bleeding noted by RN  Goals of Therapy:   INR 2-3   Plan:  1. Hold warfarin dose tonight, reassess INR in am 2. Daily PT/INR 3. Continue to monitor for any s/s of bleeding   Al Decant, PharmD Candidate 11/04/2012  11:49 AM  I have reviewed the patient's clinical status and agree with the above assessment and plan.   Link Snuffer, PharmD, BCPS Clinical Pharmacist [phone] 11/04/2012, 3:05 PM

## 2012-11-05 ENCOUNTER — Inpatient Hospital Stay (HOSPITAL_COMMUNITY): Payer: Medicare Other

## 2012-11-05 LAB — POCT I-STAT 3, ART BLOOD GAS (G3+)
pCO2 arterial: 71.6 mmHg (ref 35.0–45.0)
pH, Arterial: 7.295 — ABNORMAL LOW (ref 7.350–7.450)
pO2, Arterial: 80 mmHg (ref 80.0–100.0)

## 2012-11-05 LAB — GLUCOSE, CAPILLARY
Glucose-Capillary: 147 mg/dL — ABNORMAL HIGH (ref 70–99)
Glucose-Capillary: 140 mg/dL — ABNORMAL HIGH (ref 70–99)

## 2012-11-05 LAB — PROTIME-INR: Prothrombin Time: 30.3 seconds — ABNORMAL HIGH (ref 11.6–15.2)

## 2012-11-05 MED ORDER — WARFARIN SODIUM 1 MG PO TABS
1.0000 mg | ORAL_TABLET | Freq: Once | ORAL | Status: AC
  Administered 2012-11-05: 1 mg via ORAL
  Filled 2012-11-05: qty 1

## 2012-11-05 MED ORDER — ALBUTEROL SULFATE HFA 108 (90 BASE) MCG/ACT IN AERS
6.0000 | INHALATION_SPRAY | RESPIRATORY_TRACT | Status: DC
  Administered 2012-11-05 – 2012-11-06 (×6): 6 via RESPIRATORY_TRACT
  Filled 2012-11-05: qty 6.7

## 2012-11-05 MED ORDER — FUROSEMIDE 10 MG/ML IJ SOLN
40.0000 mg | Freq: Every day | INTRAMUSCULAR | Status: DC
  Administered 2012-11-05 – 2012-11-08 (×4): 40 mg via INTRAVENOUS
  Filled 2012-11-05 (×5): qty 4

## 2012-11-05 NOTE — Progress Notes (Signed)
Chaplain Note:  Chaplain visited with pt and pt's granddaughter.  Pt was in bed, intubated, but awake and responsive.  Granddaughter was at bedside.  Chaplain provided spiritual comfort, support, and prayer for pt and pt's granddaughter.  Pt and family expressed appreciation for chaplain support. Chaplain will follow up as needed.  11/05/12 1200  Clinical Encounter Type  Visited With Patient and family together  Visit Type Spiritual support;Initial  Referral From Other (Comment) ([facility]en referral)  Spiritual Encounters  Spiritual Needs Emotional;Prayer  Stress Factors  Patient Stress Factors Major life changes;Health changes  Family Stress Factors Major life changes   Verdie Shire, [place] [phone]

## 2012-11-05 NOTE — Progress Notes (Signed)
PULMONARY  / CRITICAL CARE MEDICINE   Name: Kathleen [name] MRN: [id number] DOB: [birth year]    LOS: 3  REFERRING MD : [facility]  CHIEF COMPLAINT:  Hypercapnic respiratory failure  BRIEF PATIENT DESCRIPTION:  67 yo with history of PE (therapeutic INR on Coumadin), OSA (noncompliant with BiPAP), on home oxygen and chronic opioids brought to ED 1/13 after slipping and falling at home. Admitted for chronic cellulitis.  Unresponsive / hypercarbic on 1/13 PM, therefore transferred to ICU 1/14 AM. Intubated 1/14 2/2 persistent hypercarbia despite BiPAP therapy.  LINES / TUBES: Foley 1/13 >>> Left IJ 1/14 >>>  CULTURES: 1/14 - Blood culture x 2 >> NGTD >>> 1/14 - Urine culture >> negative 1/14 - Respiratory culture >>> 1/14 - S.pneumo antigen >> negative  ANTIBIOTICS: Clindamycin 1/13 x 1 Vancomycin 1/13 >>1/15 Zosyn 1/13 >>1/14 Diflucan 1/13 >>1/15 Unasyn 1/15 >>> Nystatin powder 1/15 >>>    SIGNIFICANT EVENTS:  1/13 - Admitted for cellulitis, transferred to ICU for hypercarbic respiratory failure 1/14 - Remains intubated, hypercarbia improving. SW consulted to help start search for SNF.   LEVEL OF CARE:  ICU PRIMARY SERVICE:  PCCM CONSULTANTS:  None CODE STATUS: Full DIET:  TF DVT Px:  Not indicated (therapeutic Coumadin) GI Px:  Protonix    SUBJECTIVE/ INTERVAL HISTORY:  Patient more interactive this AM and is able to communicate with me through nodding. She is frustrated with the ETT and is ready for removal. She is not specifically having any pain, shortness of breath, nausea, constipation.  Interval Events: - Low grade temp overnight - Remains on DA gtt - able to wean to 8   VITAL SIGNS: Temp:  [98.5 F (36.9 C)-100.6 F (38.1 C)] 98.9 F (37.2 C) (01/16 1305) Pulse Rate:  [73-112] 104  (01/16 1135) Resp:  [17-28] 20  (01/16 1135) BP: (82-134)/(38-72) 82/62 mmHg (01/16 1135) SpO2:  [88 %-96 %] 93 % (01/16 1100) Arterial Line BP: (78-139)/(42-71)  109/51 mmHg (01/16 1100) FiO2 (%):  [40 %] 40 % (01/16 1306) HEMODYNAMICS: CVP:  [15 mmHg] 15 mmHg VENTILATOR SETTINGS: Vent Mode:  [-] PSV FiO2 (%):  [40 %] 40 % Set Rate:  [18 bmp] 18 bmp Vt Set:  [500 mL] 500 mL PEEP:  [5 cmH20] 5 cmH20 Pressure Support:  [5 cmH20-8 cmH20] 8 cmH20 Plateau Pressure:  [25 cmH20-27 cmH20] 26 cmH20  INTAKE / OUTPUT: Intake/Output      01/15 0701 - 01/16 0700 01/16 0701 - 01/17 0700   I.V. (mL/kg) 1058.6 (6.6) 173.8 (1.1)   NG/GT 340 140   IV Piggyback 200    Total Intake(mL/kg) 1598.6 (9.9) 313.8 (1.9)   Urine (mL/kg/hr) 4900 (1.3) 285 (0.3)   Total Output 4900 285   Net -3301.4 +28.8         PHYSICAL EXAMINATION: General: Obese, NAD. More interactive this AM - able to nod appropriately to questioning. Neuro:  AAO, withdraws to painful stimuli. Answers appropriately. Cardiovascular:  RRR, (+) systolic murmur. Lungs:  Distant breath sounds Abdomen: Obese, (+) BS. Nontender, nondistended. Body habitus precludes assessment of organomegaly. Intertriginous erythema, superficial skin breakdown under pannus - continues to improve, skin appears less erythematous. Musculoskeletal:  Moves all 4 ext, BLE pitting edema and cellulitis noted per distal LE bilaterally - improving.   IMAGING:   1/16 - pCXR - 1. Interval increase in diffuse interstitial edema. 2. Increasing bibasilar airspace disease. In the face of increased edema, this likely represents worsening atelectasis. 3. Suspect bilateral pleural effusions, worse on the  left. 4. The support apparatus is stable.  1/15 - pCXR - Slight decrease in the bilateral effusions. Pulmonary vascularity is now normal.  1/14 - CT Chest  - 1. Moderate bilateral pleural effusions. 2. Associated dependent airspace disease. 3. Chronic superior endplate compression fracture of T4. 4. 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  1/13 - pCXR - Bilateral airspace disease, left hilar prominence (mass vs  dilated PA seen on priviosly performed chest CT).  1/13 - Head CT - No acute findings.  1/13 - Neck CT - No acute findings. Fusion at C2-3 level. Degenerative spondylosis at C3-4, C5-6, C6-7 and C7-T1.    DIAGNOSES: Principal Problem:  *Acute-on-chronic respiratory failure Active Problems:  Personal history of pulmonary embolism  Cellulitis  Acute encephalopathy  Morbid obesity  Hypotension  OSA (obstructive sleep apnea)  Obesity hypoventilation syndrome   ASSESSMENT / PLAN:  PULMONARY  Lab 11/05/12 1202 11/04/12 1034 11/04/12 0455  PHART 7.295* 7.196* 7.308*  PCO2ART 71.6* 80.1* 59.3*  PO2ART 80.0 66.0* 92.1  HCO3 35.0* 31.1* 29.0*  TCO2 37 34 30.9   A:  1) VDRF secondary to progressive chronic hypercarbic respiratory failure in setting of untreated OSA/OHS - remained hypercarbic on bipap, therefore, intubated on 1/13. Hypercarbia is back to presumed baseline. Mental status improved. Given PCT < 1, less likely CAP, therefore, ABX to be narrowed. 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  neumonia (CAP vs aspiration) 3) Suspected pulmonary hypertension - in setting of uncontrolled OSA 4) Possible lung mass - ruled out per CT chest. Will remove from problem list. Nonsmoker.  P:   - Coumadin per pharmacy. - SBT, ABG on 5/5 shows persistent resp acidosis with pH 7.29, will likely continue on Vent x 1 additional day. - Pending tracheal aspirate culture - Continue IV Lasix 40mg  daily for additional diuresis - will deescalate as SCr rises.   CARDIOVASCULAR No results found for this basename: CKTOTAL:3,CKMB:3,TROPONINI:3 in the last 168 hours No results found for this basename: PROBNP:3 in the last 168 hours A: 1) Hypotension with history of HTN  2) Chronic coumadin therapy 2/2 PE - INR therapeutic on admission although supratherapeutic this AM - likely 2/2 antibiotics.  P:  - Continue to hold home Univasc /  Metoprolol. - MAP goal > 65 mmHg. - Wean dopamine gtt if possible. - Coumadin per pharmacy.  - See ID section.   RENAL  Lab 11/04/12 1850 11/04/12 0505 11/03/12 0525  NA 143 141 137  K 4.1 4.6 5.0  CL 104 105 100  CO2 31 27 27   BUN 16 14 24*  CREATININE 0.59 0.48* 0.80  GLUCOSE 100* 105* 141*   A: Normal renal function, appears euvolemic now. P:  - None.   GASTROINTESTINAL  Lab 11/04/12 0505  AST 43*  ALT 32  ALKPHOS 94  BILITOT 0.3  PROT 7.0  ALBUMIN 2.3*   A: No acute issues. P:  - None.   HEMATOLOGIC  Lab 11/05/12 0500 11/04/12 0505 11/03/12 0525 11/02/12 1225  WBC -- 9.0 10.1 9.5  HGB -- 12.6 12.1 12.4  HCT -- 39.0 38.8 38.7  PLT -- 234 210 217  APTT -- -- -- --  INR 3.10* 3.91* 4.32* --    A:  1) Chronic coumadin in setting of chronic PE - supratherapeutic on admit. Worsening as Abx were started.  P:  - Coumadin per pharmacy.   INFECTIOUS  Lab 11/05/12 0500 11/04/12  0505 11/03/12 0525 11/02/12 1225  WBC -- 9.0 10.1 9.5  NEUTROABS -- -- -- 7.0  LATICACIDVEN -- -- 0.7 --  PROCALCITON <0.10 <0.10 0.12 --    A:  1) SIRS/ Sepsis secondary to cellulitis - CT chest cannot rule out airspace disease and does note BL pleural effusions. As PCT < 1, less convinced of CAP, therefore, will narrow Abx. P:  - Pending blood cultures x 2, sputum culture from tracheal aspirate. - Continue Unasyn - day 3 of likely 5-7 (including when she was on Zosyn). - Continue wound care and topical Nystatin for intertriginous candidal infection / cellulitis.   ENDOCRINE  Lab 11/05/12 1149 11/05/12 0837 11/05/12 0338 11/05/12 0037 11/04/12 2032  GLUCAP 147* 108* 97 104* 92   A:  1) Prediabetes - A1c 6.3 P:  - ICU hyperglycemia protocol - Will need diabetes RN to provide education   NEUROLOGIC / PSYCHIATRIC  A:  1) Acute encephalopathy secondary to sepsis and acute on chronic hypercarbic respiratory failure - mental status improve, answering appropriately. CT  head negative. 2) Anxiety - has baseline anxiety on home PRN BZD. P:  - CAM-ICU assessments. - Restart home Ativan BID tomorrow to help with agitation.   GLOBAL: A: - Acute component of hypercarbic respiratory failure resolved/resolving. Mental status significantly improved.   - Social situation: The patient does agree that she has difficulty with taking care of herself at home, contributing towards her deconditioning and acute on chronic cellulitis. She is open to SNF placement if she qualifies.   P: - Appreciate SW for assessment for SNF eligibility, possible LTAC depending on progress.   CLINICAL SUMMARY: 68 yo with history of PE (on Coumadin), OSA (noncompliant with BiPAP), on intermittent home ) O2 admitted 1/13 for acute on chronic intertriginous cellulitis.  Had acute hypercarbic respiratory failure on 1/14 without improvement on bipap. Transferred to ICU on 1/14 and intubated. Overall is doing much better, acute component of hypercarbic respiratory failure is improved and mental status is stable. Minimal vent support at this time, will keep on vent for 1 additional day to permit additional diuresis. Continue tube feeds.   Signed: Johnette Abraham, Roma Schanz, Internal Medicine Resident Pager: [phone] (7AM-5PM)   Care during the described time interval was provided by me and/or other providers on the critical care team.  I have reviewed this patient's available data, including medical history, events of note, physical examination and test results as part of my evaluation  CC time x  35 m  Vernard Gram V.

## 2012-11-05 NOTE — Progress Notes (Addendum)
ANTICOAGULATION CONSULT NOTE - Follow up  Pharmacy Consult for Warfarin Indication:  hx PE  No Known Allergies  Patient Measurements: Height: 5\' 6"  (167.6 cm) Weight: 354 lb 15.1 oz (161 kg) IBW/kg (Calculated) : 59.3   Labs:  Basename 11/05/12 0500 11/04/12 1850 11/04/12 0505 11/03/12 0525  HGB -- -- 12.6 12.1  HCT -- -- 39.0 38.8  PLT -- -- 234 210  APTT -- -- -- --  LABPROT 30.3* -- 36.0* 38.7*  INR 3.10* -- 3.91* 4.32*  HEPARINUNFRC -- -- -- --  CREATININE -- 0.59 0.48* 0.80  CKTOTAL -- -- -- --  CKMB -- -- -- --  TROPONINI -- -- -- --   Estimated Creatinine Clearance: 106.3 ml/min (by C-G formula based on Cr of 0.59).  Medical History: Past Medical History  Diagnosis Date  . HTN (hypertension)   . Hyperlipemia   . GERD (gastroesophageal reflux disease)   . Obesity   . Sleep apnea     she has not been on CPAP  . PE (pulmonary embolism) 05/2011  . DOE (dyspnea on exertion) 01/22/2012  . Hematuria 01/22/2012    Assessment: 66 yoF presenting 11/02/12 after a fall. Pharmacy consulted to manage warfarin. Pt home dose and compliance confirmed 11/04/11 with outpatient team at [facility].   INR 3.1 remains slightly SUPRAtherapeutic.Expect the  INR will continue to drop slightly and warfarin will take ~24-36 hours to have an effect on INR.  Several antibiotics discontinued yesterday, Unasyn still on board.  No bleeding noted by RN  Goals of Therapy:   INR 2-3   Plan:  1. GIve warfarin 1mg  PO x1 dose tonight.  2. Reassess INR in am and adjust dose as needed.  3. Daily PT/INR 4. Continue to monitor for any s/s of bleeding   Al Decant, PharmD Candidate 11/05/2012  1:38 PM  I have reviewed the patient's clinical status and agree with above assessment and plan. No CBC today. We will follow-up CBC in AM.   Link Snuffer, PharmD, BCPS Clinical Pharmacist [phone] 11/05/2012, 2:35 PM

## 2012-11-05 NOTE — Progress Notes (Signed)
Clinical Social Work  CSW attempted to meet with patient but patient remains intubated. No family or friends at bedside. CSW will continue to follow and will assist as needed once disposition is clear.  [place], [place] [phone]

## 2012-11-06 ENCOUNTER — Inpatient Hospital Stay (HOSPITAL_COMMUNITY): Payer: Medicare Other

## 2012-11-06 LAB — CBC WITH DIFFERENTIAL/PLATELET
Basophils Absolute: 0 10*3/uL (ref 0.0–0.1)
Basophils Relative: 0 % (ref 0–1)
Eosinophils Absolute: 0.3 10*3/uL (ref 0.0–0.7)
Hemoglobin: 11.7 g/dL — ABNORMAL LOW (ref 12.0–15.0)
MCH: 29.5 pg (ref 26.0–34.0)
MCHC: 31.2 g/dL (ref 30.0–36.0)
Monocytes Relative: 17 % — ABNORMAL HIGH (ref 3–12)
Neutro Abs: 3.9 10*3/uL (ref 1.7–7.7)
Neutrophils Relative %: 62 % (ref 43–77)
RDW: 15.8 % — ABNORMAL HIGH (ref 11.5–15.5)

## 2012-11-06 LAB — POCT I-STAT 3, ART BLOOD GAS (G3+)
pCO2 arterial: 70.3 mmHg (ref 35.0–45.0)
pH, Arterial: 7.358 (ref 7.350–7.450)

## 2012-11-06 LAB — BASIC METABOLIC PANEL
BUN: 19 mg/dL (ref 6–23)
Calcium: 8.6 mg/dL (ref 8.4–10.5)
Creatinine, Ser: 0.46 mg/dL — ABNORMAL LOW (ref 0.50–1.10)
GFR calc Af Amer: >90 mL/min (ref >90)
GFR calc non Af Amer: >90 mL/min (ref >90)
Glucose, Bld: 132 mg/dL — ABNORMAL HIGH (ref 70–99)

## 2012-11-06 LAB — MAGNESIUM: Magnesium: 1.6 mg/dL (ref 1.5–2.5)

## 2012-11-06 LAB — GLUCOSE, CAPILLARY
Glucose-Capillary: 106 mg/dL — ABNORMAL HIGH (ref 70–99)
Glucose-Capillary: 144 mg/dL — ABNORMAL HIGH (ref 70–99)
Glucose-Capillary: 112 mg/dL — ABNORMAL HIGH (ref 70–99)
Glucose-Capillary: 94 mg/dL (ref 70–99)

## 2012-11-06 LAB — CULTURE, RESPIRATORY W GRAM STAIN

## 2012-11-06 LAB — PROTIME-INR: Prothrombin Time: 28.1 seconds — ABNORMAL HIGH (ref 11.6–15.2)

## 2012-11-06 MED ORDER — WARFARIN SODIUM 1 MG PO TABS
1.0000 mg | ORAL_TABLET | Freq: Once | ORAL | Status: AC
  Administered 2012-11-06: 1 mg via ORAL
  Filled 2012-11-06: qty 1

## 2012-11-06 MED ORDER — ALBUTEROL SULFATE HFA 108 (90 BASE) MCG/ACT IN AERS
2.0000 | INHALATION_SPRAY | Freq: Four times a day (QID) | RESPIRATORY_TRACT | Status: DC
  Administered 2012-11-06 – 2012-11-09 (×10): 2 via RESPIRATORY_TRACT
  Filled 2012-11-06: qty 6.7

## 2012-11-06 MED ORDER — WHITE PETROLATUM GEL
Status: AC
  Administered 2012-11-06: 16:00:00
  Filled 2012-11-06: qty 5

## 2012-11-06 NOTE — Consult Note (Addendum)
Wound care follow-up:  Bedside nurse called to explain Interdry became soiled with stool.  Pt remains with moist, macerated skin in abd folds.  Another dose of Interdry ordered for 5 days.  Use can be discontinued after 1/21. Instructions at bedside for staff use. Will not plan to follow further unless re-consulted.  [address] RN, MSN, [employer]  [phone]

## 2012-11-06 NOTE — Progress Notes (Signed)
Notified Dawn, Wound Care RN that Interdry was soiled with stool. Was notified to order another one and leave on for 5 days. Do not remove interdry until 11/10/12.

## 2012-11-06 NOTE — Progress Notes (Signed)
Clinical Social Work  CSW spoke with MD who reports patient is to be extubated today. MD reports that patient and friends have shown interest in SNF. CSW met with patient and friends at bedside. Patient remains intubated but able to communicate by shaking her head yes and no. CSW explained SNF process and provided SNF list. CSW explained Medicare benefits for SNF. Patient agreeable and prefers search in [place] since it is close to home. Friend reports that PCP recommended [facility]. CSW explained that CSW would fax out after patient is extubated.   Friend asked about HCPOA. CSW explained that once patient is extubated and is able to communicate that she is alert and oriented then CSW can assist with advanced directives.   CSW completed FL2 and saved in [facility]. CSW will update FL2 and fax out after patient is extubated.  [place], [place] [phone]

## 2012-11-06 NOTE — Procedures (Signed)
Extubation Procedure Note  Patient Details:   Name: ADYN HOES DOB: 07-Nov-1945 MRN: [id number]   Airway Documentation:  Airway 7.5 mm (Active)  Secured at (cm) 23 cm 11/06/2012  7:17 AM  Measured From Lips 11/06/2012  7:17 AM  Secured Location Center 11/06/2012  7:17 AM  Secured By [employer] 11/06/2012  7:17 AM  Tube Holder Repositioned Yes 11/06/2012  7:17 AM  Cuff Pressure (cm H2O) 25 cm H2O 11/05/2012  7:16 PM  Site Condition Dry 11/05/2012  3:48 PM    Evaluation  O2 sats: stable throughout Complications: No apparent complications Patient did tolerate procedure well. Bilateral Breath Sounds: Rhonchi Suctioning: Airway Yes  Placed on 4L [place] and tolerating well.   Devra Dopp D 11/06/2012, 12:19 PM

## 2012-11-06 NOTE — Progress Notes (Signed)
ANTICOAGULATION CONSULT NOTE - Follow up  Pharmacy Consult for Warfarin Indication:  hx PE  No Known Allergies  Patient Measurements: Height: 5\' 6"  (167.6 cm) Weight: 354 lb 15.1 oz (161 kg) IBW/kg (Calculated) : 59.3   Labs:  Basename 11/06/12 0440 11/05/12 0500 11/04/12 1850 11/04/12 0505  HGB 11.7* -- -- 12.6  HCT 37.5 -- -- 39.0  PLT 207 -- -- 234  APTT -- -- -- --  LABPROT 28.1* 30.3* -- 36.0*  INR 2.80* 3.10* -- 3.91*  HEPARINUNFRC -- -- -- --  CREATININE 0.46* -- 0.59 0.48*  CKTOTAL -- -- -- --  CKMB -- -- -- --  TROPONINI -- -- -- --   Estimated Creatinine Clearance: 106.3 ml/min (by C-G formula based on Cr of 0.46).  Medical History: Past Medical History  Diagnosis Date  . HTN (hypertension)   . Hyperlipemia   . GERD (gastroesophageal reflux disease)   . Obesity   . Sleep apnea     she has not been on CPAP  . PE (pulmonary embolism) 05/2011  . DOE (dyspnea on exertion) 01/22/2012  . Hematuria 01/22/2012    Assessment: 70 yoF presenting 11/02/12 after a fall at home. Patient has history of PE on warfarin at home. On admission, patient's INR was SUPRAtherapeutic. Pharmacy consulted to manage warfarin while hospitalized.  Warfarin held for 3 days to allow INR to drop into therapeutic range. On 11/05/12 warfarin reinitiated at low dose (1mg ) to maintain therapeutic levels.   INR 2.8, therapeutic today   Hgb 11.7, slightly low and trending down, HCT WNL  Unasyn is the only antibiotic still on board.  No bleeding noted by RN  Goals of Therapy:   INR 2-3   Plan:  1. GIve warfarin 1mg  PO x1 dose tonight.  2. Reassess INR in am and adjust dose as needed.  3. Daily PT/INR 4. Continue to monitor for any s/s of bleeding  Al Decant, PharmD Candidate 11/06/2012  11:00 AM  I have discussed and agree with the plan as outlined above.  Nadara Mustard, PharmD., MS Clinical Pharmacist Pager:  [phone] Thank you for allowing pharmacy to be part of this  patients care team.

## 2012-11-06 NOTE — Progress Notes (Signed)
Clinical Social Work  Patient extubated and on Lexa so CSW updated FL2 and faxed out for SNF offers in [place].  [place], [place] [phone]

## 2012-11-06 NOTE — Progress Notes (Addendum)
Clinical Social Work Department CLINICAL SOCIAL WORK PLACEMENT NOTE 11/06/2012  Patient:  Kathleen [name], Kathleen [name]  Account Number:  [id number] Admit date:  11/02/2012  Clinical Social Worker:  Unk Lightning, LCSW  Date/time:  11/06/2012 11:50 AM  Clinical Social Work is seeking post-discharge placement for this patient at the following level of care:   SKILLED NURSING   (*CSW will update this form in Epic as items are completed)   11/06/2012  Patient/family provided with [facility] Department of Clinical Social Work's list of facilities offering this level of care within the geographic area requested by the patient (or if unable, by the patient's family).  11/06/2012  Patient/family informed of their freedom to choose among providers that offer the needed level of care, that participate in Medicare, Medicaid or managed care program needed by the patient, have an available bed and are willing to accept the patient.  11/06/2012  Patient/family informed of [facility]' ownership interest in [facility], as well as of the fact that they are under no obligation to receive care at this facility.  PASARR submitted to EDS on 11/06/2012 PASARR number received from EDS on 11/06/2012  FL2 transmitted to all facilities in geographic area requested by pt/family on 11/06/12 FL2 transmitted to all facilities within larger geographic area on   Patient informed that his/her managed care company has contracts with or will negotiate with  certain facilities, including the following:     Patient/family informed of bed offers received:  [place] Patient chooses bed at [facility] Physician recommends and patient chooses bed at  [facility]  Patient to be transferred to [facility] on  11/09/12 Patient to be transferred to facility by 11/09/12  The following physician request were entered in Epic:   Additional Comments:

## 2012-11-06 NOTE — Progress Notes (Signed)
PULMONARY  / CRITICAL CARE MEDICINE   Name: Kathleen [name] MRN: [id number] DOB: 12-17-1945    LOS: 4  REFERRING MD : [facility]  CHIEF COMPLAINT:  Hypercapnic respiratory failure  BRIEF PATIENT DESCRIPTION:  67 yo with history of PE (therapeutic INR on Coumadin), OSA (noncompliant with BiPAP), on home oxygen and chronic opioids brought to ED 1/13 after slipping and falling at home. Admitted for chronic cellulitis.  Unresponsive / hypercarbic on 1/13 PM, therefore transferred to ICU 1/14 AM. Intubated 1/14 2/2 persistent hypercarbia despite BiPAP therapy.  LINES / TUBES: Foley 1/13 >>> Left IJ 1/14 >>>  CULTURES: 1/14 - Blood culture x 2 >> NGTD >> 1/14 - Urine culture >> negative 1/14 - Respiratory culture >> negative 1/14 - S.pneumo antigen >> negative  ANTIBIOTICS: Clindamycin 1/13 x 1 Vancomycin 1/13 >>1/15 Zosyn 1/13 >>1/14 Diflucan 1/13 >>1/15 Unasyn 1/15 >>> Nystatin powder 1/15 >>>    SIGNIFICANT EVENTS:  1/13 - Admitted for cellulitis, transferred to ICU for hypercarbic respiratory failure 1/14 - Remains intubated, hypercarbia improving. SW consulted to help start search for SNF. 1/17 - Extubated.   LEVEL OF CARE:  ICU PRIMARY SERVICE:  PCCM CONSULTANTS:  None CODE STATUS: Full DIET:  TF DVT Px:  Not indicated (therapeutic Coumadin) GI Px:  Protonix   SUBJECTIVE/ INTERVAL HISTORY:  Patient more interactive this AM and is able to communicate with me through nodding. She is frustrated with the ETT and is ready for removal. She is not specifically having any pain, shortness of breath, nausea, constipation.  Interval Events: - No issues overnight.   VITAL SIGNS: Temp:  [98.2 F (36.8 C)-99.5 F (37.5 C)] 98.3 F (36.8 C) (01/17 0350) Pulse Rate:  [67-112] 72  (01/17 0500) Resp:  [17-28] 17  (01/17 0500) BP: (82-134)/(38-64) 106/57 mmHg (01/17 0500) SpO2:  [88 %-96 %] 95 % (01/17 0500) Arterial Line BP: (71-139)/(42-71) 71/65 mmHg (01/16  1300) FiO2 (%):  [40 %] 40 % (01/17 0400) HEMODYNAMICS: CVP:  [15 mmHg] 15 mmHg VENTILATOR SETTINGS: Vent Mode:  [-] PRVC FiO2 (%):  [40 %] 40 % Set Rate:  [18 bmp] 18 bmp Vt Set:  [500 mL] 500 mL PEEP:  [5 cmH20] 5 cmH20 Pressure Support:  [5 cmH20-10 cmH20] 10 cmH20 Plateau Pressure:  [24 cmH20-27 cmH20] 27 cmH20  INTAKE / OUTPUT: Intake/Output      01/16 0701 - 01/17 0700   I.V. (mL/kg) 883.7 (5.5)   NG/GT 800   IV Piggyback 150   Total Intake(mL/kg) 1833.7 (11.4)   Urine (mL/kg/hr) 5520 (1.4)   Total Output 5520   Net -3686.4        PHYSICAL EXAMINATION: General: Obese, NAD. Interactive this AM - able to nod appropriately to questioning. Neuro:  AAO, withdraws to painful stimuli. Answers appropriately. Cardiovascular:  RRR, (+) systolic murmur. Lungs:  Distant breath sounds Abdomen: Obese, (+) BS. Nontender, nondistended. Body habitus precludes assessment of organomegaly. Intertriginous erythema, superficial skin breakdown under pannus - continues to improve daily, skin appears less erythematous. Musculoskeletal:  Moves all 4 ext, BLE pitting edema and cellulitis noted per distal LE bilaterally - improving.   IMAGING:   1/16 - pCXR - 1. Interval increase in diffuse interstitial edema. 2. Increasing bibasilar airspace disease. In the face of increased edema, this likely represents worsening atelectasis. 3. Suspect bilateral pleural effusions, worse on the left. 4. The support apparatus is stable.  1/15 - pCXR - Slight decrease in the bilateral effusions. Pulmonary vascularity is now normal.  1/14 -  CT Chest  - 1. Moderate bilateral pleural effusions. 2. Associated dependent airspace disease. 3. Chronic superior endplate compression fracture of T4. 4. 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  1/13 - pCXR - Bilateral airspace disease, left hilar prominence (mass vs dilated PA seen on priviosly performed chest CT).  1/13 - Head CT - No acute  findings.  1/13 - Neck CT - No acute findings. Fusion at C2-3 level. Degenerative spondylosis at C3-4, C5-6, C6-7 and C7-T1.   M DIAGNOSES: Principal Problem:  *Acute-on-chronic respiratory failure Active Problems:  Personal history of pulmonary embolism  Cellulitis  Acute encephalopathy  Morbid obesity  Hypotension  OSA (obstructive sleep apnea)  Obesity hypoventilation syndrome   ASSESSMENT / PLAN:  PULMONARY  Lab 11/05/12 1202 11/04/12 1034 11/04/12 0455  PHART 7.295* 7.196* 7.308*  PCO2ART 71.6* 80.1* 59.3*  PO2ART 80.0 66.0* 92.1  HCO3 35.0* 31.1* 29.0*  TCO2 37 34 30.9   A:  1) VDRF secondary to progressive chronic hypercarbic respiratory failure in setting of untreated OSA/OHS - remained hypercarbic on bipap, therefore, intubated on 1/13. Hypercarbia is back to presumed baseline. Mental status improved. Given PCT < 1, less likely CAP. Extubated 1/17. 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  neumonia (CAP vs aspiration) 3) Suspected pulmonary hypertension - in setting of uncontrolled OSA 4) Possible lung mass - ruled out per CT chest. Will remove from problem list. Nonsmoker.  P:   - Coumadin per pharmacy. - ABG on 5 and 5, with pH > 7.35, therefore, transitioned to [place]. - Continue IV Lasix 40mg  daily for additional diuresis - will deescalate as SCr rises. - Change to q6h BD. -bipap q hs   CARDIOVASCULAR No results found for this basename: CKTOTAL:3,CKMB:3,TROPONINI:3 in the last 168 hours No results found for this basename: PROBNP:3 in the last 168 hours A: 1) Hypotension with history of HTN  2) Chronic coumadin therapy 2/2 PE - INR therapeutic on admission although supratherapeutic this AM - likely 2/2 antibiotics.  P:  - Continue to hold home Univasc / Metoprolol. - MAP goal > 65 mmHg. - Wean dopamine gtt if possible. - Coumadin per pharmacy.  - See ID section.   RENAL  Lab 11/06/12  0440 11/04/12 1850 11/04/12 0505  NA 145 143 141  K 3.7 4.1 4.6  CL 102 104 105  CO2 36* 31 27  BUN 19 16 14   CREATININE 0.46* 0.59 0.48*  GLUCOSE 132* 100* 105*   A: Normal renal function. P:  - None.   GASTROINTESTINAL  Lab 11/04/12 0505  AST 43*  ALT 32  ALKPHOS 94  BILITOT 0.3  PROT 7.0  ALBUMIN 2.3*   A: No acute issues. P:  - None.   HEMATOLOGIC  Lab 11/06/12 0440 11/05/12 0500 11/04/12 0505 11/03/12 0525  WBC 6.2 -- 9.0 10.1  HGB 11.7* -- 12.6 12.1  HCT 37.5 -- 39.0 38.8  PLT 207 -- 234 210  APTT -- -- -- --  INR 2.80* 3.10* 3.91* --    A:  1) Chronic coumadin in setting of chronic PE - supratherapeutic on admit. Worsening as Abx were started. Therapeutic.  P:  - Coumadin per pharmacy.   INFECTIOUS  Lab 11/06/12 0440 11/05/12 0500 11/04/12 0505 11/03/12 0525 11/02/12 1225  WBC 6.2 -- 9.0 10.1 --  NEUTROABS 3.9 -- -- -- 7.0  LATICACIDVEN -- -- -- 0.7 --  PROCALCITON -- <0.10 <0.10 0.12 --  A:  1) SIRS/ Sepsis secondary to cellulitis - CT chest cannot rule out airspace disease and does note BL pleural effusions. As PCT < 1, less convinced of CAP, therefore, will narrow Abx. Negative sputum culture from tracheal aspirate P:  - Pending blood cultures x 2. - Continue Unasyn - day 4 of 5 (including when she was on Zosyn). - Continue wound care and topical Nystatin for intertriginous candidal infection / cellulitis.   ENDOCRINE  Lab 11/06/12 0348 11/06/12 0042 11/05/12 2025 11/05/12 1633 11/05/12 1149  GLUCAP 106* 121* 122* 140* 147*   A:  1) Prediabetes - A1c 6.3 P:  - ICU hyperglycemia protocol - Will need diabetes RN to provide education   NEUROLOGIC / PSYCHIATRIC  A:  1) Acute encephalopathy secondary to sepsis and acute on chronic hypercarbic respiratory failure - mental status improve, answering appropriately. CT head negative. 2) Anxiety - has baseline anxiety on home PRN BZD. P:  - CAM-ICU assessments. - Will need to clarify  home Ativan usage - ? Contributing towards worsening hypercarbia.   GLOBAL: A: - Acute component of hypercarbic respiratory failure resolved/resolving. Mental status significantly improved.   - Social situation: The patient does agree that she has difficulty with taking care of herself at home, contributing towards her deconditioning and acute on chronic cellulitis. She is open to SNF placement if she qualifies.   P: - Appreciate SW for assessment for SNF eligibility, possible LTAC depending on progress.   CLINICAL SUMMARY: 67 yo with history of PE (on Coumadin), OSA (noncompliant with BiPAP), on intermittent home ) O2 admitted 1/13 for acute on chronic intertriginous cellulitis.  Had acute hypercarbic respiratory failure on 1/14 without improvement on bipap. Transferred to ICU on 1/14 and intubated. Overall is doing much better, acute component of hypercarbic respiratory failure is improved and mental status is stable. ABG on 5/5 with pH > 7.3, therefore, extubated 1/17 and transitioned to [place].   Signed: Johnette Abraham, Roma Schanz, Internal Medicine Resident Pager: [phone] (7AM-5PM)  Care during the described time interval was provided by me and/or other providers on the critical care team.  I have reviewed this patient's available data, including medical history, events of note, physical examination and test results as part of my evaluation  CC time x  35 m  ALVA,RAKESH V.

## 2012-11-07 LAB — COMPREHENSIVE METABOLIC PANEL
Alkaline Phosphatase: 73 U/L (ref 39–117)
BUN: 17 mg/dL (ref 6–23)
Calcium: 8.3 mg/dL — ABNORMAL LOW (ref 8.4–10.5)
GFR calc Af Amer: >90 mL/min (ref >90)
GFR calc non Af Amer: >90 mL/min (ref >90)
Glucose, Bld: 105 mg/dL — ABNORMAL HIGH (ref 70–99)
Potassium: 3.5 mEq/L (ref 3.5–5.1)
Total Protein: 6.6 g/dL (ref 6.0–8.3)

## 2012-11-07 LAB — CBC
HCT: 35.8 % — ABNORMAL LOW (ref 36.0–46.0)
RDW: 15.6 % — ABNORMAL HIGH (ref 11.5–15.5)
WBC: 6 10*3/uL (ref 4.0–10.5)

## 2012-11-07 LAB — MAGNESIUM
Magnesium: 1.4 mg/dL — ABNORMAL LOW (ref 1.5–2.5)
Magnesium: 1.6 mg/dL (ref 1.5–2.5)

## 2012-11-07 MED ORDER — MAGNESIUM SULFATE 40 MG/ML IJ SOLN
2.0000 g | Freq: Once | INTRAMUSCULAR | Status: AC
  Administered 2012-11-07: 2 g via INTRAVENOUS
  Filled 2012-11-07: qty 50

## 2012-11-07 MED ORDER — WARFARIN SODIUM 3 MG PO TABS
3.0000 mg | ORAL_TABLET | Freq: Once | ORAL | Status: AC
  Administered 2012-11-07: 3 mg via ORAL
  Filled 2012-11-07: qty 1

## 2012-11-07 NOTE — Progress Notes (Signed)
Report given to RN Vernona Rieger 3 west. Vss.

## 2012-11-07 NOTE — Progress Notes (Signed)
ANTICOAGULATION CONSULT NOTE - Follow up  Pharmacy Consult for Warfarin Indication:  hx PE  No Known Allergies  Patient Measurements: Height: 5\' 6"  (167.6 cm) Weight: 354 lb 15.1 oz (161 kg) IBW/kg (Calculated) : 59.3   Labs:  Basename 11/07/12 0415 11/06/12 0440 11/05/12 0500 11/04/12 1850  HGB 11.1* 11.7* -- --  HCT 35.8* 37.5 -- --  PLT 175 207 -- --  APTT -- -- -- --  LABPROT 26.3* 28.1* 30.3* --  INR 2.56* 2.80* 3.10* --  HEPARINUNFRC -- -- -- --  CREATININE 0.61 0.46* -- 0.59  CKTOTAL -- -- -- --  CKMB -- -- -- --  TROPONINI -- -- -- --   Estimated Creatinine Clearance: 106.3 ml/min (by C-G formula based on Cr of 0.61).  Medical History: Past Medical History  Diagnosis Date  . HTN (hypertension)   . Hyperlipemia   . GERD (gastroesophageal reflux disease)   . Obesity   . Sleep apnea     she has not been on CPAP  . PE (pulmonary embolism) 05/2011  . DOE (dyspnea on exertion) 01/22/2012  . Hematuria 01/22/2012   Assessment: 34 yoF presenting 11/02/12 after a fall at home. Patient has history of PE on warfarin at home. On admission, patient's INR was SUPRAtherapeutic. Pharmacy consulted to manage warfarin while hospitalized.  Warfarin held for 3 days to allow INR to drop into therapeutic range. On 11/05/12 warfarin reinitiated at low dose (1mg ) to maintain therapeutic levels.   HOME dose:  Warfarin 5 me daily except 7.5mg  on Tues/Thurs.   INR 2.56, therapeutic today   Hgb 11.1, platelets 175K - no noted bleeding.  Unasyn is the only antibiotic still on board.  Goals of Therapy:   INR 2-3   Plan:  1. GIve warfarin 3 mg PO x1 dose tonight.  2. Reassess INR in am and adjust dose as needed.  3. Continue to monitor for any s/s of bleeding  Nadara Mustard, PharmD., MS Clinical Pharmacist Pager:  [phone] Thank you for allowing pharmacy to be part of this patients care team. 11/07/2012 10:49AM

## 2012-11-07 NOTE — Progress Notes (Signed)
Pt wanted to try CPAP but did not like the pressure. She said that she would like to maybe try it later or possibly tomorrow night. Rt will continue to monitor pt.

## 2012-11-07 NOTE — Progress Notes (Signed)
[facility] ADULT ICU REPLACEMENT PROTOCOL FOR AM LAB REPLACEMENT ONLY  The patient does not apply for the [facility] Adult ICU Electrolyte Replacment Protocol based on the criteria listed below:   2. Is urine output >/= 0.5 ml/kg/hr for the last 8 hours? no Patient's UOP is 0.1 ml/kg/hr  Kathleen [name] P 11/07/2012 5:25 AM

## 2012-11-07 NOTE — Progress Notes (Signed)
CRITICAL CARE RESIDENT NOTE Interim Progress Note     RESULT INTERVENTION TAKEN  1. Magnesium 1.4 2g Magnesium sulfate  2.    3.      Signed: Genelle Gather, MD Internal Medicine Resident, PGY I [facility] Internal Medicine Program Pager: [phone] 11/07/2012 5:44 AM

## 2012-11-07 NOTE — Progress Notes (Signed)
PULMONARY  / CRITICAL CARE MEDICINE   Name: Kathleen [name] MRN: [id number] DOB: 01-09-1946    LOS: 5  REFERRING MD : [facility]  CHIEF COMPLAINT:  Hypercapnic respiratory failure  BRIEF PATIENT DESCRIPTION:  67 yo with history of PE (therapeutic INR on Coumadin), OSA (noncompliant with BiPAP), on home oxygen and chronic opioids brought to ED 1/13 after slipping and falling at home. Admitted for chronic cellulitis.  Unresponsive / hypercarbic on 1/13 PM, therefore transferred to ICU 1/14 AM. Intubated 1/14 2/2 persistent hypercarbia despite BiPAP therapy.  LINES / TUBES: Foley 1/13 >>> Left IJ 1/14 >>>  CULTURES: 1/14 - Blood culture x 2 >> NGTD >> 1/14 - Urine culture >> negative 1/14 - Respiratory culture >> negative 1/14 - S.pneumo antigen >> negative  ANTIBIOTICS: Clindamycin 1/13 x 1 Vancomycin 1/13 >>1/15 Zosyn 1/13 >>1/14 Diflucan 1/13 >>1/15 Unasyn 1/15 >>> Nystatin powder 1/15 >>>    SIGNIFICANT EVENTS:  1/13 - Admitted for cellulitis, transferred to ICU for hypercarbic respiratory failure 1/14 - Remains intubated, hypercarbia improving. SW consulted to help start search for SNF. 1/17 - Extubated.   LEVEL OF CARE:  ICU PRIMARY SERVICE:  PCCM CONSULTANTS:  None CODE STATUS: Full DIET:  Oral  DVT Px:  Not indicated (therapeutic Coumadin) GI Px:  Protonix   SUBJECTIVE/ INTERVAL HISTORY: Extubated 1/17    VITAL SIGNS: Temp:  [97 F (36.1 C)-99 F (37.2 C)] 98 F (36.7 C) (01/18 1219) Pulse Rate:  [81-99] 94  (01/18 0900) Resp:  [21-30] 24  (01/18 0900) BP: (89-123)/(48-68) 110/64 mmHg (01/18 0800) SpO2:  [89 %-95 %] 91 % (01/18 1507) HEMODYNAMICS:   VENTILATOR SETTINGS:    INTAKE / OUTPUT: Intake/Output      01/17 0701 - 01/18 0700 01/18 0701 - 01/19 0700   P.O.  200   I.V. (mL/kg) 484.6 (3) 20 (0.1)   NG/GT 120    IV Piggyback 200    Total Intake(mL/kg) 804.6 (5) 220 (1.4)   Urine (mL/kg/hr) 2660 (0.7)    Stool 1    Total Output  2661    Net -1856.4 +220        Stool Occurrence 4 x     PHYSICAL EXAMINATION: General: Obese, NAD. Interactive this AM   Neuro:  AAO, . Answers appropriately. Cardiovascular:  RRR, (+) systolic murmur. Lungs:  Distant breath sounds Abdomen: Obese, (+) BS. Nontender, nondistended. Body habitus precludes assessment of organomegaly. Intertriginous erythema, superficial skin breakdown under pannus -   Musculoskeletal:  Moves all 4 ext, BLE pitting edema and cellulitis noted per distal LE bilaterally - improving.   IMAGING:   1/16 - pCXR - 1. Interval increase in diffuse interstitial edema. 2. Increasing bibasilar airspace disease. In the face of increased edema, this likely represents worsening atelectasis. 3. Suspect bilateral pleural effusions, worse on the left. 4. The support apparatus is stable.  1/15 - pCXR - Slight decrease in the bilateral effusions. Pulmonary vascularity is now normal.  1/14 - CT Chest  - 1. Moderate bilateral pleural effusions. 2. Associated dependent airspace disease. 3. Chronic superior endplate compression fracture of T4. 4. 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  1/13 - pCXR - Bilateral airspace disease, left hilar prominence (mass vs dilated PA seen on priviosly performed chest CT).  1/13 - Head CT - No acute findings.  1/13 - Neck CT - No acute findings. Fusion at C2-3 level. Degenerative spondylosis at C3-4, C5-6, C6-7 and C7-T1.   M DIAGNOSES: Principal Problem:  *  Acute-on-chronic respiratory failure Active Problems:  Personal history of pulmonary embolism  Cellulitis  Acute encephalopathy  Morbid obesity  Hypotension  OSA (obstructive sleep apnea)  Obesity hypoventilation syndrome   ASSESSMENT / PLAN:  PULMONARY  Lab 11/06/12 1026 11/05/12 1202 11/04/12 1034  PHART 7.358 7.295* 7.196*  PCO2ART 70.3* 71.6* 80.1*  PO2ART 86.0 80.0 66.0*  HCO3 39.5* 35.0* 31.1*  TCO2 42 37 34   A:  1) VDRF secondary to  progressive chronic hypercarbic respiratory failure in setting of untreated OSA/OHS - remained hypercarbic on bipap, therefore, intubated on 1/13. Hypercarbia is back to presumed baseline. Mental status improved. Given PCT < 1, less likely CAP. 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  neumonia (CAP vs aspiration) 3) Suspected pulmonary hypertension - in setting of uncontrolled OSA 4) Possible lung mass - ruled out per CT chest. Will remove from problem list. Nonsmoker.  P:   - Coumadin per pharmacy. - Change to q6h BD. -CPAP mandatory q hs   CARDIOVASCULAR No results found for this basename: CKTOTAL:3,CKMB:3,TROPONINI:3 in the last 168 hours No results found for this basename: PROBNP:3 in the last 168 hours A: 1) Hypotension with history of HTN -b/p rx on hold. Off pressors  2) Chronic coumadin therapy 2/2 PE - INR therapeutic on admission  P:  - Continue to hold home Univasc / Metoprolol. - Coumadin per pharmacy.     RENAL  Lab 11/07/12 0415 11/06/12 0440 11/04/12 1850  NA 144 145 143  K 3.5 3.7 4.1  CL 101 102 104  CO2 37* 36* 31  BUN 17 19 16   CREATININE 0.61 0.46* 0.59  GLUCOSE 105* 132* 100*   A: Normal renal function. P:  - None.   GASTROINTESTINAL  Lab 11/07/12 0415 11/04/12 0505  AST 47* 43*  ALT 26 32  ALKPHOS 73 94  BILITOT 0.4 0.3  PROT 6.6 7.0  ALBUMIN 2.3* 2.3*   A: No acute issues. P:  - None.   HEMATOLOGIC  Lab 11/07/12 0415 11/06/12 0440 11/05/12 0500 11/04/12 0505  WBC 6.0 6.2 -- 9.0  HGB 11.1* 11.7* -- 12.6  HCT 35.8* 37.5 -- 39.0  PLT 175 207 -- 234  APTT -- -- -- --  INR 2.56* 2.80* 3.10* --    A:  1) Chronic coumadin in setting of chronic PE - supratherapeutic on admit.   P:  - Coumadin per pharmacy.   INFECTIOUS  Lab 11/07/12 0415 11/06/12 0440 11/05/12 0500 11/04/12 0505 11/03/12 0525 11/02/12 1225  WBC 6.0 6.2 -- 9.0 -- --    NEUTROABS -- 3.9 -- -- -- 7.0  LATICACIDVEN -- -- -- -- 0.7 --  PROCALCITON -- -- <0.10 <0.10 0.12 --    A:  1) SIRS/ Sepsis secondary to cellulitis - CT chest cannot rule out airspace disease and does note BL pleural effusions. As PCT < 1, less convinced of CAP,   Negative sputum culture from tracheal aspirate  P:  - Pending blood cultures x 2. - Continue Unasyn - day 5 of 5 (including when she was on Zosyn). - Continue wound care and topical Nystatin for intertriginous candidal infection / cellulitis.   ENDOCRINE  Lab 11/06/12 1657 11/06/12 1156 11/06/12 0836 11/06/12 0348 11/06/12 0042  GLUCAP 94 112* 144* 106* 121*   A:  1) Prediabetes - A1c 6.3 P:  - ICU hyperglycemia protocol - Will need diabetes RN to provide  education   NEUROLOGIC / PSYCHIATRIC  A:  1) Acute encephalopathy secondary to sepsis and acute on chronic hypercarbic respiratory failure - mental status improve, answering appropriately. CT head negative.>improved  2) Anxiety - has baseline anxiety on home PRN BZD.  P:  montior     GLOBAL: A: - Acute component of hypercarbic respiratory failure resolved/resolving. Mental status significantly improved.   - Social situation: The patient does agree that she has difficulty with taking care of herself at home, contributing towards her deconditioning and acute on chronic cellulitis. She is open to SNF placement if she qualifies.   P: - Appreciate SW for assessment for SNF eligibility, possible LTAC depending on progress.   CLINICAL SUMMARY: 67 yo with history of PE (on Coumadin), OSA (noncompliant with BiPAP), on intermittent home ) O2 admitted 1/13 for acute on chronic intertriginous cellulitis.  Had acute hypercarbic respiratory failure on 1/14 without improvement on bipap. Transferred to ICU on 1/14 and intubated. Overall is doing much better, acute component of hypercarbic respiratory failure is improved and mental status is stable. ABG on 5/5 with pH > 7.3,  therefore, extubated 1/17 and transitioned to [place]. Plan to transfer to  Tele w/ CPAP At bedtime  -discussed that if she does not use CPAP she will likely end up in the same situation with resp failure again. Needs Sleep study as outpt   PARRETT,TAMMY NP-C    Dynisha Due V.

## 2012-11-08 DIAGNOSIS — K219 Gastro-esophageal reflux disease without esophagitis: Secondary | ICD-10-CM

## 2012-11-08 DIAGNOSIS — I2699 Other pulmonary embolism without acute cor pulmonale: Secondary | ICD-10-CM

## 2012-11-08 LAB — GLUCOSE, CAPILLARY
Glucose-Capillary: 124 mg/dL — ABNORMAL HIGH (ref 70–99)
Glucose-Capillary: 111 mg/dL — ABNORMAL HIGH (ref 70–99)
Glucose-Capillary: 103 mg/dL — ABNORMAL HIGH (ref 70–99)

## 2012-11-08 MED ORDER — WARFARIN SODIUM 2.5 MG PO TABS
2.5000 mg | ORAL_TABLET | Freq: Once | ORAL | Status: AC
  Administered 2012-11-08: 2.5 mg via ORAL
  Filled 2012-11-08: qty 1

## 2012-11-08 MED ORDER — FUROSEMIDE 40 MG PO TABS
40.0000 mg | ORAL_TABLET | Freq: Every day | ORAL | Status: DC
  Administered 2012-11-08 – 2012-11-09 (×2): 40 mg via ORAL
  Filled 2012-11-08 (×3): qty 1

## 2012-11-08 MED ORDER — PANTOPRAZOLE SODIUM 40 MG PO TBEC
40.0000 mg | DELAYED_RELEASE_TABLET | Freq: Every day | ORAL | Status: DC
  Administered 2012-11-08 – 2012-11-09 (×2): 40 mg via ORAL
  Filled 2012-11-08: qty 1

## 2012-11-08 MED ORDER — INSULIN ASPART 100 UNIT/ML ~~LOC~~ SOLN: 0.0000 [IU] | Freq: Three times a day (TID) | SUBCUTANEOUS | Status: DC

## 2012-11-08 NOTE — Progress Notes (Signed)
[facility] PROGRESS NOTE  Kathleen [name] JYN:[id number] DOB: 06/21/1946 DOA: 11/02/2012 PCP: Kathleen Pollen., PA  Assessment/Plan: 1-Acute on chronic resp failure: due to progressive chronic hypercarbic respiratory failure in setting of untreated OSA/OHS. Extubated on 1/17. Plan is to continue supplemental oxygen during the day and mandatory CPAP at bedtime. Will continue QID bronchodilators as recommended by PCCM.  2-PE: continue coumadin per pharmacy  3-Cellulitis: unasyn for one more day to finish treatment. Leg erythema significantly improved/almost resolved.  4-Pannus fungal infection: continue nystatin and topical care  5-Diabetes: A1C 6.3; continue SSI  6-GERD: continue PPI.  7-Obesity: low calorie diet has been discussed with patient  8-deconditioning: PT/OT to evaluate patient and provide recommendations.  Code Status: Full Family Communication: no family at bedside Disposition Plan: needs placement. PT/OT to evaluate patient   Consultants:  PCCM  Procedures: See below for x-ray reports Intubation 1/14 due to hypercarbia ans acute resp failure. Extubated on 1/17  Antibiotics:  unasyn  HPI/Subjective: Afebrile, still with some difficulty speaking in full sentences; refuse CPAP last night. AAOX3  Objective: Filed Vitals:   11/07/12 2048 11/07/12 2100 11/08/12 0123 11/08/12 0649  BP:  103/59  112/60  Pulse: 86 85 90 86  Temp:  98.6 F (37 C)  98.2 F (36.8 C)  TempSrc:  Oral  Oral  Resp: 18 18 18 18   Height:      Weight:   163 kg (359 lb 5.6 oz)   SpO2: 94%  92% 94%    Intake/Output Summary (Last 24 hours) at 11/08/12 1140 Last data filed at 11/08/12 0600  Gross per 24 hour  Intake    660 ml  Output      0 ml  Net    660 ml   Filed Weights   11/02/12 2029 11/03/12 0118 11/08/12 0123  Weight: 166.379 kg (366 lb 12.8 oz) 161 kg (354 lb 15.1 oz) 163 kg (359 lb 5.6 oz)    Exam:   General:  AAOX3, no CP; still with some SOB and difficulty  speaking in full sentences  Cardiovascular: positive SEM, no rubs or gallops; S1 and S2  Respiratory: no crackles, mild wheezing  Abdomen: soft, NT, ND, positive BS; pannus erythema and fungal infection.  Neuro: non focal  Data Reviewed: Basic Metabolic Panel:  Lab 11/07/12 8657 11/06/12 2330 11/06/12 0440 11/04/12 1850 11/04/12 0505 11/03/12 0525  NA 144 -- 145 143 141 137  K 3.5 -- 3.7 4.1 4.6 5.0  CL 101 -- 102 104 105 100  CO2 37* -- 36* 31 27 27   GLUCOSE 105* -- 132* 100* 105* 141*  BUN 17 -- 19 16 14  24*  CREATININE 0.61 -- 0.46* 0.59 0.48* 0.80  CALCIUM 8.3* -- 8.6 8.5 8.4 8.3*  MG 1.4* 1.6 1.6 -- -- --  PHOS -- -- -- -- -- --   Liver Function Tests:  Lab 11/07/12 0415 11/04/12 0505  AST 47* 43*  ALT 26 32  ALKPHOS 73 94  BILITOT 0.4 0.3  PROT 6.6 7.0  ALBUMIN 2.3* 2.3*   CBC:  Lab 11/07/12 0415 11/06/12 0440 11/04/12 0505 11/03/12 0525 11/02/12 1225  WBC 6.0 6.2 9.0 10.1 9.5  NEUTROABS -- 3.9 -- -- 7.0  HGB 11.1* 11.7* 12.6 12.1 12.4  HCT 35.8* 37.5 39.0 38.8 38.7  MCV 95.2 94.7 95.1 96.3 93.9  PLT 175 207 234 210 217   CBG:  Lab 11/08/12 0734 11/07/12 2111 11/06/12 1657 11/06/12 1156 11/06/12 0836  GLUCAP 124* 101* 94 112*  144*    Recent Results (from the past 240 hour(s))  MRSA PCR SCREENING     Status: Normal   Collection Time   11/03/12  1:18 AM      Component Value Range Status Comment   MRSA by PCR NEGATIVE  NEGATIVE Final   URINE CULTURE     Status: Normal   Collection Time   11/03/12  1:18 AM      Component Value Range Status Comment   Specimen Description URINE, CATHETERIZED   Final    Special Requests NONE   Final    Culture  Setup Time 11/03/2012 09:24   Final    Colony Count NO GROWTH   Final    Culture NO GROWTH   Final    Report Status 11/04/2012 FINAL   Final   CULTURE, BLOOD (ROUTINE X 2)     Status: Normal (Preliminary result)   Collection Time   11/03/12  2:00 AM      Component Value Range Status Comment   Specimen  Description BLOOD RIGHT ARM   Final    Special Requests     Final    Value: BOTTLES DRAWN AEROBIC AND ANAEROBIC 10CC BLUE,8CC RED   Culture  Setup Time 11/03/2012 09:28   Final    Culture     Final    Value:        BLOOD CULTURE RECEIVED NO GROWTH TO DATE CULTURE WILL BE HELD FOR 5 DAYS BEFORE ISSUING A FINAL NEGATIVE REPORT   Report Status PENDING   Incomplete   CULTURE, BLOOD (ROUTINE X 2)     Status: Normal (Preliminary result)   Collection Time   11/03/12  2:15 AM      Component Value Range Status Comment   Specimen Description BLOOD RIGHT HAND   Final    Special Requests BOTTLES DRAWN AEROBIC ONLY 8CC   Final    Culture  Setup Time 11/03/2012 09:28   Final    Culture     Final    Value:        BLOOD CULTURE RECEIVED NO GROWTH TO DATE CULTURE WILL BE HELD FOR 5 DAYS BEFORE ISSUING A FINAL NEGATIVE REPORT   Report Status PENDING   Incomplete   CULTURE, RESPIRATORY     Status: Normal   Collection Time   11/04/12 10:20 AM      Component Value Range Status Comment   Specimen Description TRACHEAL ASPIRATE   Final    Special Requests NONE   Final    Gram Stain     Final    Value: ABUNDANT WBC PRESENT,BOTH PMN AND MONONUCLEAR     RARE SQUAMOUS EPITHELIAL CELLS PRESENT     RARE GRAM POSITIVE COCCI IN PAIRS   Culture NO GROWTH 2 DAYS   Final    Report Status 11/06/2012 FINAL   Final      Studies: No results found.  Scheduled Meds:   . albuterol  2 puff Inhalation Q6H  . ampicillin-sulbactam (UNASYN) 1.5 g IVPB  1.5 g Intravenous Q6H  . antiseptic oral rinse  15 mL Mouth Rinse QID  . chlorhexidine  15 mL Mouth Rinse BID  . furosemide  40 mg Intravenous Daily  . multivitamin  5 mL Per Tube Daily  . nystatin   Topical TID  . sodium chloride  3 mL Intravenous Q12H  . warfarin  2.5 mg Oral ONCE-1800  . Warfarin - Pharmacist Dosing Inpatient   Does not apply q1800   Continuous  Infusions:   Principal Problem:  *Acute-on-chronic respiratory failure Active Problems:  Personal  history of pulmonary embolism  Cellulitis  Acute encephalopathy  Morbid obesity  Hypotension  OSA (obstructive sleep apnea)  Obesity hypoventilation syndrome    Time spent: >30 minutes    Kathleen [name]  [facility] Pager [phone]. If 8PM-8AM, please contact night-coverage at [email or web address], password [facility] 11/08/2012, 11:40 AM  LOS: 6 days

## 2012-11-08 NOTE — Progress Notes (Signed)
ANTICOAGULATION CONSULT NOTE - Follow Up Consult  Pharmacy Consult for Coumadin Indication: Hx PE  No Known Allergies  Patient Measurements: Height: 5\' 6"  (167.6 cm) Weight: 359 lb 5.6 oz (163 kg) IBW/kg (Calculated) : 59.3  Heparin Dosing Weight:   Vital Signs: Temp: 98.2 F (36.8 C) (01/19 0649) Temp src: Oral (01/19 0649) BP: 112/60 mmHg (01/19 0649) Pulse Rate: 86  (01/19 0649)  Labs:  Basename 11/08/12 0715 11/07/12 0415 11/06/12 0440  HGB -- 11.1* 11.7*  HCT -- 35.8* 37.5  PLT -- 175 207  APTT -- -- --  LABPROT 28.6* 26.3* 28.1*  INR 2.87* 2.56* 2.80*  HEPARINUNFRC -- -- --  CREATININE -- 0.61 0.46*  CKTOTAL -- -- --  CKMB -- -- --  TROPONINI -- -- --    Estimated Creatinine Clearance: 107.1 ml/min (by C-G formula based on Cr of 0.61).  Assessment: 66yof continuing on Coumadin for hx PE. INR (2.87) remains therapeutic but is requiring much smaller doses than PTA regimen (5mg  daily except 7.5mg  TT). - No CBC this AM - No significant bleeding reported  Goal of Therapy:  INR 2-3   Plan:  1. Coumadin 2.5mg  po x 1 today 2. Follow-up AM INR 3. Antibiotic therapy complete per MD notes - follow-up discontinuation of Unasyn  Cleon Dew [phone] 11/08/2012,8:50 AM

## 2012-11-08 NOTE — Progress Notes (Signed)
Patient placed on CPAP, full face mask.  Patient is apprehensive about wearing CPAP, but was reassured and the procedure and its importance was explained.  Patient agreed to try.  Set at 10 cmH20 initially, but titrated to 8 per pt request.  4L bleed in.

## 2012-11-08 NOTE — Evaluation (Signed)
Physical Therapy Evaluation Patient Details Name: Kathleen [name] MRN: [id number] DOB: 09/19/1946 Today's Date: 11/08/2012 Time: 4098-1191 PT Time Calculation (min): 31 min  PT Assessment / Plan / Recommendation Clinical Impression  Patient is a 67 yo female admitted following a fall.  Developed VDRF - extubated 1/17.  Patient with general weakness throughout and DOE of 4/4 during mobility with O2 on.  Will benefit from acute PT to maximize independence and increase activity tolerance prior to discharge.  Recommend SNF for continued therapy prior to return home - needs to achieve mod I functional level.    PT Assessment  Patient needs continued PT services    Follow Up Recommendations  SNF    Does the patient have the potential to tolerate intense rehabilitation      Barriers to Discharge Decreased caregiver support      Equipment Recommendations  Wheelchair (measurements PT);Wheelchair cushion (measurements PT)    Recommendations for Other Services     Frequency Min 3X/week    Precautions / Restrictions Precautions Precautions: Fall Restrictions Weight Bearing Restrictions: No   Pertinent Vitals/Pain       Mobility  Bed Mobility Bed Mobility: Rolling Right;Rolling Left;Left Sidelying to Sit;Sit to Supine;Scooting to [facility] Rolling Right: 3: Mod assist;With rail Rolling Left: 3: Mod assist;With rail Left Sidelying to Sit: 3: Mod assist;HOB elevated Sit to Supine: 1: +2 Total assist;HOB elevated Sit to Supine: Patient Percentage: 40% Scooting to HOB: 1: +2 Total assist Scooting to [facility]: Patient Percentage: 40% Details for Bed Mobility Assistance: Verbal cues for technique.  Patient pulls on PT's hand to roll until reaches rail.  Uses rail for remainder of rolling.  Mod assist required to raise trunk off bed.  Patient sat EOB x 6 minutes working on balance and upright posture/trunk control.  Returned to supine with +2 assist.  Patient able to use LE's to assist with moving to  [facility] in supine. Transfers Transfers: Not assessed       Exercises General Exercises - Lower Extremity Ankle Circles/Pumps: AROM;Both;10 reps;Seated Long Arc Quad: AROM;Both;5 reps;Seated   PT Diagnosis: Difficulty walking;Generalized weakness  PT Problem List: Decreased strength;Decreased activity tolerance;Decreased balance;Decreased mobility;Cardiopulmonary status limiting activity;Obesity;Decreased skin integrity PT Treatment Interventions: DME instruction;Gait training;Functional mobility training;Therapeutic activities;Therapeutic exercise;Balance training;Patient/family education   PT Goals Acute Rehab PT Goals PT Goal Formulation: With patient Time For Goal Achievement: 11/22/12 Potential to Achieve Goals: Fair Pt will go Supine/Side to Sit: with modified independence;with rail;with HOB not 0 degrees (comment degree) PT Goal: Supine/Side to Sit - Progress: Goal set today Pt will go Sit to Supine/Side: with modified independence;with HOB not 0 degrees (comment degree);with rail PT Goal: Sit to Supine/Side - Progress: Goal set today Pt will go Sit to Stand: with min assist;with upper extremity assist PT Goal: Sit to Stand - Progress: Goal set today Pt will go Stand to Sit: with min assist;with upper extremity assist PT Goal: Stand to Sit - Progress: Goal set today Pt will Transfer Bed to Chair/Chair to Bed: with min assist PT Transfer Goal: Bed to Chair/Chair to Bed - Progress: Goal set today Pt will Ambulate: 51 - 150 feet;with supervision;with rolling walker PT Goal: Ambulate - Progress: Goal set today  Visit Information  Last PT Received On: 11/08/12 Assistance Needed: +2    Subjective Data  Subjective: They told me I'd have to wear a CPAP. Patient Stated Goal: To walk   Prior Functioning  Home Living Lives With: Alone Available Help at Discharge: Family;Available PRN/intermittently Type of  Home: House Home Access: Level entry Home Layout: One level Bathroom  Shower/Tub: Health visitor: Handicapped height Bathroom Accessibility: Yes How Accessible: Accessible via walker Home Adaptive Equipment: Grab bars in shower;Walker - rolling;Built-in shower seat (Lift chair) Prior Function Level of Independence: Independent with assistive device(s);Needs assistance Needs Assistance: Light Housekeeping Light Housekeeping: Maximal Driving: Yes Vocation: Retired Musician: No difficulties    Cognition  Overall Cognitive Status: Appears within functional limits for tasks assessed/performed Arousal/Alertness: Awake/alert Orientation Level: Appears intact for tasks assessed Behavior During Session: [facility] for tasks performed    Extremity/Trunk Assessment Right Upper Extremity Assessment RUE ROM/Strength/Tone: Deficits RUE ROM/Strength/Tone Deficits: Strength grossly 4/5 RUE Sensation: WFL - Light Touch Left Upper Extremity Assessment LUE ROM/Strength/Tone: Deficits LUE ROM/Strength/Tone Deficits: Strength grossly 4/5 LUE Sensation: WFL - Light Touch Right Lower Extremity Assessment RLE ROM/Strength/Tone: Deficits RLE ROM/Strength/Tone Deficits: Strength grossly 3+/5 RLE Sensation: WFL - Light Touch (Skin changes related to cellulitis) Left Lower Extremity Assessment LLE ROM/Strength/Tone: Deficits LLE ROM/Strength/Tone Deficits: Strength grossly 3+/5 LLE Sensation: WFL - Light Touch (Skin changes related to cellulitis)   Balance Balance Balance Assessed: Yes Static Sitting Balance Static Sitting - Balance Support: Bilateral upper extremity supported;Feet unsupported Static Sitting - Level of Assistance: 4: Min assist Static Sitting - Comment/# of Minutes: Patient able to sit x 6 minutes with bil. UE support to maintain balance (min assist required x3 to maintain balance.  End of Session PT - End of Session Equipment Utilized During Treatment: Oxygen Activity Tolerance: Patient limited by fatigue;Treatment limited  secondary to medical complications (Comment) (Dyspnea 4/4 at end of session) Patient left: in bed;with call bell/phone within reach Nurse Communication: Mobility status;Need for lift equipment; Or use seat position of bed for upright positioning  GP     Vena Austria 11/08/2012, 2:51 PM Durenda Hurt. Renaldo Fiddler, [facility] Acute Rehab Services Pager [phone]

## 2012-11-09 DIAGNOSIS — E785 Hyperlipidemia, unspecified: Secondary | ICD-10-CM

## 2012-11-09 DIAGNOSIS — R0902 Hypoxemia: Secondary | ICD-10-CM

## 2012-11-09 LAB — CULTURE, BLOOD (ROUTINE X 2): Culture: NO GROWTH

## 2012-11-09 LAB — GLUCOSE, CAPILLARY: Glucose-Capillary: 102 mg/dL — ABNORMAL HIGH (ref 70–99)

## 2012-11-09 LAB — PROTIME-INR
INR: 3.2 — ABNORMAL HIGH (ref 0.00–1.49)
Prothrombin Time: 31 seconds — ABNORMAL HIGH (ref 11.6–15.2)

## 2012-11-09 MED ORDER — IPRATROPIUM-ALBUTEROL 18-103 MCG/ACT IN AERO: 2.0000 | INHALATION_SPRAY | Freq: Four times a day (QID) | RESPIRATORY_TRACT | Status: DC

## 2012-11-09 MED ORDER — NEBIVOLOL HCL 10 MG PO TABS: 10.0000 mg | ORAL_TABLET | Freq: Every day | ORAL | Status: DC

## 2012-11-09 MED ORDER — OMEPRAZOLE 40 MG PO CPDR: 40.0000 mg | DELAYED_RELEASE_CAPSULE | Freq: Every day | ORAL | Status: DC

## 2012-11-09 MED ORDER — ADULT MULTIVITAMIN W/MINERALS CH
1.0000 | ORAL_TABLET | Freq: Every day | ORAL | Status: DC
  Filled 2012-11-09: qty 1

## 2012-11-09 MED ORDER — NYSTATIN 100000 UNIT/GM EX POWD: 100000.0000 g | Freq: Three times a day (TID) | CUTANEOUS | Status: DC

## 2012-11-09 MED ORDER — FUROSEMIDE 40 MG PO TABS: 40.0000 mg | ORAL_TABLET | Freq: Every day | ORAL | Status: DC

## 2012-11-09 MED ORDER — METFORMIN HCL 500 MG PO TABS: 500.0000 mg | ORAL_TABLET | Freq: Two times a day (BID) | ORAL | Status: DC

## 2012-11-09 NOTE — Progress Notes (Signed)
PULMONARY  / CRITICAL CARE MEDICINE   Name: Kathleen [name] MRN: [id number] DOB: 1946-03-03    LOS: 7  REFERRING MD : [facility]  CHIEF COMPLAINT:  Hypercapnic respiratory failure  BRIEF PATIENT DESCRIPTION:  67 yo with history of PE (therapeutic INR on Coumadin), OSA (noncompliant with BiPAP), on home oxygen and chronic opioids brought to ED 1/13 after slipping and falling at home. Admitted for chronic cellulitis.  Unresponsive / hypercarbic on 1/13 PM, therefore transferred to ICU 1/14 AM. Intubated 1/14 2/2 persistent hypercarbia despite BiPAP therapy.  LINES / TUBES: Foley 1/13 >>> Left IJ 1/14 >>>  CULTURES: 1/14 - Blood culture x 2 >> NGTD >>neg 1/14 - Urine culture >> negative 1/14 - Respiratory culture >> negative 1/14 - S.pneumo antigen >> negative  ANTIBIOTICS: Clindamycin 1/13 x 1 Vancomycin 1/13 >>1/15 Zosyn 1/13 >>1/14 Diflucan 1/13 >>1/15 Unasyn 1/15 >>> Nystatin powder 1/15 >>>    SIGNIFICANT EVENTS:  1/13 - Admitted for cellulitis, transferred to ICU for hypercarbic respiratory failure 1/14 - Remains intubated, hypercarbia improving. SW consulted to help start search for SNF. 1/17 - Extubated.     SUBJECTIVE/ INTERVAL HISTORY: nad   VITAL SIGNS: Temp:  [98.1 F (36.7 C)-98.5 F (36.9 C)] 98.5 F (36.9 C) (01/20 0500) Pulse Rate:  [83-89] 85  (01/20 0500) Resp:  [18-20] 20  (01/20 0500) BP: (114-133)/(68-78) 114/69 mmHg (01/20 0500) SpO2:  [90 %-96 %] 92 % (01/20 0757) Weight:  [161.7 kg (356 lb 7.7 oz)] 161.7 kg (356 lb 7.7 oz) (01/20 0500) HEMODYNAMICS:   VENTILATOR SETTINGS:    INTAKE / OUTPUT: Intake/Output      01/19 0701 - 01/20 0700 01/20 0701 - 01/21 0700   P.O. 360 360   I.V. (mL/kg) 240 (1.5)    IV Piggyback 200    Total Intake(mL/kg) 800 (4.9) 360 (2.2)   Urine (mL/kg/hr) 3400 (0.9)    Total Output 3400    Net -2600 +360         PHYSICAL EXAMINATION: General: Obese, NAD. Interactive this AM   Neuro:  AAO, .  Answers appropriately. Cardiovascular:  RRR, (+) systolic murmur. Lungs:  Distant breath sounds Abdomen: Obese, (+) BS. Nontender, nondistended. Body habitus precludes assessment of organomegaly. Intertriginous erythema, superficial skin breakdown under pannus -   Musculoskeletal:  Moves all 4 ext, BLE pitting edema and cellulitis noted per distal LE bilaterally - improving.   IMAGING:  No results found.   M DIAGNOSES: Principal Problem:  *Acute-on-chronic respiratory failure Active Problems:  Personal history of pulmonary embolism  Cellulitis  Acute encephalopathy  Morbid obesity  Hypotension  OSA (obstructive sleep apnea)  Obesity hypoventilation syndrome   ASSESSMENT / PLAN:  PULMONARY  Lab 11/06/12 1026 11/05/12 1202 11/04/12 1034  PHART 7.358 7.295* 7.196*  PCO2ART 70.3* 71.6* 80.1*  PO2ART 86.0 80.0 66.0*  HCO3 39.5* 35.0* 31.1*  TCO2 42 37 34   A:  1) VDRF secondary to progressive chronic hypercarbic respiratory failure in setting of untreated OSA/OHS - remained hypercarbic on bipap, therefore, intubated on 1/13. Hypercarbia is back to presumed baseline. Mental status improved. Given PCT < 1, less likely CAP. 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  neumonia (CAP vs aspiration) 3) Suspected pulmonary hypertension - in setting of uncontrolled OSA 4) Possible lung mass - ruled out per CT chest. Will remove from problem list. Nonsmoker.  P:   - Coumadin per  pharmacy. - Change to q6h BD. -CPAP mandatory q hs, refuses cpap -needs swallow eval   CARDIOVASCULAR No results found for this basename: CKTOTAL:3,CKMB:3,TROPONINI:3 in the last 168 hours No results found for this basename: PROBNP:3 in the last 168 hours A: 1) Hypotension with history of HTN -b/p rx on hold. Off pressors  2) Chronic coumadin therapy 2/2 PE - INR therapeutic on admission  P:  - Continue to  hold home Univasc / Metoprolol. - Coumadin per pharmacy.     RENAL  Lab 11/07/12 0415 11/06/12 0440 11/04/12 1850  NA 144 145 143  K 3.5 3.7 4.1  CL 101 102 104  CO2 37* 36* 31  BUN 17 19 16   CREATININE 0.61 0.46* 0.59  GLUCOSE 105* 132* 100*   A: Normal renal function. P:  - None.   GASTROINTESTINAL  Lab 11/07/12 0415 11/04/12 0505  AST 47* 43*  ALT 26 32  ALKPHOS 73 94  BILITOT 0.4 0.3  PROT 6.6 7.0  ALBUMIN 2.3* 2.3*   A: No acute issues. P:  - None.   HEMATOLOGIC  Lab 11/09/12 0645 11/08/12 0715 11/07/12 0415 11/06/12 0440 11/04/12 0505  WBC -- -- 6.0 6.2 9.0  HGB -- -- 11.1* 11.7* 12.6  HCT -- -- 35.8* 37.5 39.0  PLT -- -- 175 207 234  APTT -- -- -- -- --  INR 3.20* 2.87* 2.56* -- --    A:  1) Chronic coumadin in setting of chronic PE - supratherapeutic on admit.   P:  - Coumadin per pharmacy.   INFECTIOUS  Lab 11/07/12 0415 11/06/12 0440 11/05/12 0500 11/04/12 0505 11/03/12 0525 11/02/12 1225  WBC 6.0 6.2 -- 9.0 -- --  NEUTROABS -- 3.9 -- -- -- 7.0  LATICACIDVEN -- -- -- -- 0.7 --  PROCALCITON -- -- <0.10 <0.10 0.12 --    A:  1) SIRS/ Sepsis secondary to cellulitis - CT chest cannot rule out airspace disease and does note BL pleural effusions. As PCT < 1, less convinced of CAP,   Negative sputum culture from tracheal aspirate  P:  - Pending blood cultures x 2. Neg x 2 1-20 - Continue Unasyn - day 5 of 5 (including when she was on Zosyn). - Continue wound care and topical Nystatin for intertriginous candidal infection / cellulitis.   ENDOCRINE  Lab 11/09/12 0729 11/08/12 2114 11/08/12 1704 11/08/12 1145 11/08/12 0734  GLUCAP 113* 102* 103* 111* 124*   A:  1) Prediabetes - A1c 6.3 P:  - ICU hyperglycemia protocol - Will need diabetes RN to provide education   NEUROLOGIC / PSYCHIATRIC  A:  1) Acute encephalopathy secondary to sepsis and acute on chronic hypercarbic respiratory failure - mental status improve, answering  appropriately. CT head negative.>improved  2) Anxiety - has baseline anxiety on home PRN BZD.  P:  [place]     GLOBAL: A: - Acute component of hypercarbic respiratory failure resolved/resolving. Mental status significantly improved.   - Social situation: The patient does agree that she has difficulty with taking care of herself at home, contributing towards her deconditioning and acute on chronic cellulitis. She is open to SNF placement if she qualifies. Needs swallow eval.  P: - Appreciate SW for assessment for SNF eligibility, possible LTAC depending on progress.   CLINICAL SUMMARY: 67 yo with history of PE (on Coumadin), OSA (noncompliant with BiPAP), on intermittent home ) O2 admitted 1/13 for acute on chronic intertriginous cellulitis.  Had acute hypercarbic respiratory failure on 1/14 without improvement  on bipap. Transferred to ICU on 1/14 and intubated. Overall is doing much better, acute component of hypercarbic respiratory failure is improved and mental status is stable. ABG on 5/5 with pH > 7.3, therefore, extubated 1/17 and transitioned to [place]. Plan to transfer to  Tele w/ CPAP At bedtime  -discussed that if she does not use CPAP she will likely end up in the same situation with resp failure again. Needs Sleep study as outpt. She needs swallow evaluation.  Brett Canales Minor ACNP [facility] Pager [phone] till 3 pm If no answer page [phone] 11/09/2012, 10:38 AM  Levy Pupa, MD, PhD 11/09/2012, 12:45 PM [place] [phone] or if no answer [phone]

## 2012-11-09 NOTE — Progress Notes (Signed)
Clinical Social Worker spoke with Kathleen [name] at [facility] who stated she is able to accept pt today.  CSW provided contact information to pt to coordinate admission paperwork.   Angelia Mould, MSW, [place] [phone]

## 2012-11-09 NOTE — Progress Notes (Signed)
ANTICOAGULATION CONSULT NOTE - Follow Up Consult  Pharmacy Consult for Coumadin Indication: Hx PE  No Known Allergies  Patient Measurements: Height: 5\' 6"  (167.6 cm) Weight: 356 lb 7.7 oz (161.7 kg) IBW/kg (Calculated) : 59.3  Heparin Dosing Weight:   Vital Signs: Temp: 98.5 F (36.9 C) (01/20 0500) Temp src: Oral (01/20 0500) BP: 114/69 mmHg (01/20 0500) Pulse Rate: 85  (01/20 0500)  Labs:  Basename 11/09/12 0645 11/08/12 0715 11/07/12 0415  HGB -- -- 11.1*  HCT -- -- 35.8*  PLT -- -- 175  APTT -- -- --  LABPROT 31.0* 28.6* 26.3*  INR 3.20* 2.87* 2.56*  HEPARINUNFRC -- -- --  CREATININE -- -- 0.61  CKTOTAL -- -- --  CKMB -- -- --  TROPONINI -- -- --    Estimated Creatinine Clearance: 106.6 ml/min (by C-G formula based on Cr of 0.61).  Assessment: 66yof continuing on Coumadin for hx PE. INR (3.2) now supratherapeutic. No bleeding noted, CBC stable.    Goal of Therapy:  INR 2-3   Plan:  1. No coumadin today 2. F/u AM INR  Lysle Pearl, PharmD, BCPS Pager # [phone] 11/09/2012 8:51 AM

## 2012-11-09 NOTE — Discharge Summary (Signed)
Physician Discharge Summary  Kathleen [name] VHQ:[id number] DOB: 11/02/1945 DOA: 11/02/2012  PCP: Horald Pollen., PA  Admit date: 11/02/2012 Discharge date: 11/09/2012  Time spent: >30 minutes  Recommendations for Outpatient Follow-up:  1. Follow DM and adjust medications as needed. Discharged on Metformin 2. Arrange follow up with [facility] pulmonology (Dr. Vassie Loll) after sleep study done. 3. BMET in 1 week to follow electrolytes and kidney function  Discharge Diagnoses:  Principal Problem:  *Acute-on-chronic respiratory failure Active Problems:  Personal history of pulmonary embolism  Cellulitis  Acute encephalopathy  Morbid obesity  Hypotension  OSA (obstructive sleep apnea)  Obesity hypoventilation syndrome   Discharge Condition: stable and improved. Will be discharge to SNF for rehabilitation. Sleep study as an outpatient.   Diet recommendation: low carbohydrates, low calorie and low sodium. Also dysphagia 3 diet with thin liquids  Filed Weights   11/03/12 0118 11/08/12 0123 11/09/12 0500  Weight: 161 kg (354 lb 15.1 oz) 163 kg (359 lb 5.6 oz) 161.7 kg (356 lb 7.7 oz)    History of present illness:  67 yo with history of PE (therapeutic INR on Coumadin), OSA (noncompliant with BiPAP), on home oxygen and chronic opioids brought to ED 1/13 after slipping and falling at home. Admitted for chronic cellulitis. Patient subsequently becomes unresponsive due to hypercarbic resp failure and required to be intubated and transfer to ICU.   Hospital Course:  1-Acute on chronic resp failure: due to progressive chronic hypercarbic respiratory failure in setting of untreated OSA/OHS. Extubated on 1/17; doing ok and with O2 sat on 2-3 L oxygen. Plan is to continue supplemental oxygen during the day and mandatory CPAP at bedtime. Will continue QID bronchodilators as recommended by PCCM. Sleep study as an outpatient  2-PE: continue coumadin  3-Cellulitis: treated with unasyn IV and treatment  finished while in house. Leg erythema resolved; no open wounds and just trace to 1+ chronic edema.   4-Pannus fungal infection: continue nystatin and topical care   5-Diabetes: A1C 6.3; Patient w/o hx of diabetes in the past. Will start patient metformin 500mg  BID and ask her to follow with PCP at discharge. Low carb diet recommended.  6-GERD: continue PPI.   7-Obesity: low calorie diet has been discussed with patient   8-deconditioning: PT/OT has recommended SNF. Will be discharge to [facility].  9-Mild dysphagia after intubation: patient will be discharge on dysphagia 3 diet and advance as tolerated. No signs of aspiration.  HTN: stable; continue current regimen.  Procedures: See below for x-ray reports  Intubation 1/14 due to hypercarbia ans acute resp failure. Extubated on 1/17  Consultations: PCCM  Discharge Exam: Filed Vitals:   11/08/12 2100 11/08/12 2330 11/09/12 0500 11/09/12 0757  BP: 116/68  114/69   Pulse: 83 87 85   Temp: 98.4 F (36.9 C)  98.5 F (36.9 C)   TempSrc: Oral  Oral   Resp: 18 20 20    Height:      Weight:   161.7 kg (356 lb 7.7 oz)   SpO2: 93% 94% 92% 92%    General: AAOX3, no CP; still with some SOB and difficulty speaking in full sentences  Cardiovascular: positive SEM, no rubs or gallops; S1 and S2  Respiratory: no crackles, mild wheezing  Abdomen: soft, NT, ND, positive BS; pannus erythema and fungal infection.  Neuro: non focal  Discharge Instructions  Discharge Orders    Future Orders Please Complete By Expires   Diet - low sodium heart healthy  Increase activity slowly      Discharge instructions      Comments:   -Dysphagia 3 diet and thin liquids (advance as tolerated) -Follow a low sodium, low carbohydrates and low calorie diet -Take medications as prescribed -Wear your CPAP every single night at bedtime for at least 8 hours. -Arrange follow up with PCP in 2 weeks after been discharge from facility -Keep skin clean  and dry -       Medication List     As of 11/09/2012 11:11 AM    STOP taking these medications         metoprolol succinate 50 MG 24 hr tablet   Commonly known as: TOPROL-XL      TAKE these medications         albuterol-ipratropium 18-103 MCG/ACT inhaler   Commonly known as: COMBIVENT   Inhale 2 puffs into the lungs 4 (four) times daily.      atorvastatin 40 MG tablet   Commonly known as: LIPITOR   Take 40 mg by mouth at bedtime.      cholecalciferol 1000 UNITS tablet   Commonly known as: VITAMIN D   Take 4,000 Units by mouth daily.      ezetimibe 10 MG tablet   Commonly known as: ZETIA   Take 10 mg by mouth at bedtime.      furosemide 40 MG tablet   Commonly known as: LASIX   Take 1 tablet (40 mg total) by mouth daily.      LORazepam 0.5 MG tablet   Commonly known as: ATIVAN   Take 0.5 mg by mouth every 12 (twelve) hours as needed. For anxiety      metFORMIN 500 MG tablet   Commonly known as: GLUCOPHAGE   Take 1 tablet (500 mg total) by mouth 2 (two) times daily with a meal.      moexipril 15 MG tablet   Commonly known as: UNIVASC   Take 15 mg by mouth daily.      nebivolol 10 MG tablet   Commonly known as: BYSTOLIC   Take 1 tablet (10 mg total) by mouth daily.      nystatin 100000 UNIT/GM Powd   Apply 100,000 g topically 3 (three) times daily.      omeprazole 40 MG capsule   Commonly known as: PRILOSEC   Take 1 capsule (40 mg total) by mouth daily.      warfarin 5 MG tablet   Commonly known as: COUMADIN   Take 5 mg by mouth See admin instructions. 5mg  daily Except 7.5mg  Tuesdays and Thursdays only            Follow-up Information    Follow up with [facility]. (Call office after sleep study to be seen; Dr. Vassie Loll to read sleep study)    Contact information:   [address] [place] [address] [phone]      Follow up with Horald Pollen., PA. Schedule an appointment as soon as possible for a visit in 2 weeks.  (after discharge from facility)    Contact information:   [address] [place] [address] [phone]           The results of significant diagnostics from this hospitalization (including imaging, microbiology, ancillary and laboratory) are listed below for reference.    Significant Diagnostic Studies: Ct Head Wo Contrast  11/02/2012  *RADIOLOGY REPORT*  Clinical Data:  Fall with trauma to the head and neck.  Unable to get up.  Weakness.  CT HEAD WITHOUT CONTRAST CT CERVICAL SPINE WITHOUT CONTRAST  Technique:  Multidetector CT imaging of the head and cervical spine was performed following the standard protocol without intravenous contrast.  Multiplanar CT image reconstructions of the cervical spine were also generated.  Comparison:   None  CT HEAD  Findings: There are areas of low density in the hemispheric white matter bilaterally most consistent with chronic small vessel disease.  No sign of acute infarction, mass lesion, hemorrhage, hydrocephalus or extra-axial collection.  No skull fracture.  No traumatic fluid in the sinuses.  There is some mucosal thickening within the sinuses. There is atherosclerotic calcification of the major vessels at the base of the brain.  IMPRESSION: No acute appearing finding.  Chronic small vessel changes within the hemispheric white matter.  No traumatic finding.  CT CERVICAL SPINE  Findings: No fracture or traumatic malalignment.  There is degenerative spondylosis at C3-4, C5-6, C6-7 and C7-T1.  No severe encroachment upon the canal.  There is some foraminal narrowing in the region because of osteophytic encroachment.  Lesser degenerative changes are also noted at C4-5.  The facets appear to be fused at the C2-3 level.  IMPRESSION: No acute or traumatic finding.  Fusion at the C2-3 level.  Adjacent segment degenerative spondylosis at C3-4 as well as at C5-6, C6-7 and C7-T1.   Original Report Authenticated By: Paulina Fusi, M.D.    Ct  Chest W Contrast  11/03/2012  *RADIOLOGY REPORT*  Clinical Data: Widened mediastinum.  Question mass.  CT CHEST WITH CONTRAST  Technique:  Multidetector CT imaging of the chest was performed following the standard protocol during bolus administration of intravenous contrast.  Contrast: 75mL OMNIPAQUE IOHEXOL 300 MG/ML  SOLN  Comparison: Chest x-ray 11/03/2012.  CT of the chest 01/30/2012.  Findings: The endotracheal tube terminates 4 cm above the carina. A left IJ line is in place.  The heart size is normal.  The upper mediastinum is unremarkable.  Moderate bilateral pleural effusions are present.  Bilateral dependent airspace disease is noted as well.  This likely reflects atelectasis.  Early infection is not excluded at the bases.  No other focal nodule, mass, or airspace disease is present.  The bone windows demonstrate degenerative changes of the thoracic spine.  A chronic T4 compression fracture is noted.  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  IMPRESSION:  1.  Moderate bilateral pleural effusions. 2.  Associated dependent airspace disease.  While this likely reflects atelectasis, infection is not excluded. 3.  Chronic superior endplate compression fracture of T4. 4.  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   Original Report Authenticated By: Marin Roberts, M.D.    Ct Cervical Spine Wo Contrast  11/02/2012  *RADIOLOGY REPORT*  Clinical Data:  Fall with trauma to the head and neck.  Unable to get up.  Weakness.  CT HEAD WITHOUT CONTRAST CT CERVICAL SPINE WITHOUT CONTRAST  Technique:  Multidetector CT imaging of the head and cervical spine was performed following the standard protocol without intravenous contrast.  Multiplanar CT image reconstructions of the cervical spine were also generated.  Comparison:   None  CT HEAD  Findings: There are areas of low density in the hemispheric white matter bilaterally most consistent with chronic small vessel disease.  No  sign of acute infarction, mass lesion, hemorrhage, hydrocephalus or extra-axial collection.  No skull fracture.  No traumatic fluid in the sinuses.  There is some mucosal thickening within  the sinuses. There is atherosclerotic calcification of the major vessels at the base of the brain.  IMPRESSION: No acute appearing finding.  Chronic small vessel changes within the hemispheric white matter.  No traumatic finding.  CT CERVICAL SPINE  Findings: No fracture or traumatic malalignment.  There is degenerative spondylosis at C3-4, C5-6, C6-7 and C7-T1.  No severe encroachment upon the canal.  There is some foraminal narrowing in the region because of osteophytic encroachment.  Lesser degenerative changes are also noted at C4-5.  The facets appear to be fused at the C2-3 level.  IMPRESSION: No acute or traumatic finding.  Fusion at the C2-3 level.  Adjacent segment degenerative spondylosis at C3-4 as well as at C5-6, C6-7 and C7-T1.   Original Report Authenticated By: Paulina Fusi, M.D.    Dg Chest Port 1 View  11/06/2012  *RADIOLOGY REPORT*  Clinical Data: Intubated.  PORTABLE CHEST - 1 VIEW  Comparison: 11/05/2012  Findings: Endotracheal tube tip measures about 5.5 cm above the carina.  Enteric tube tip is not visualized but is below the left hemidiaphragm consistent with location and/or distal to the stomach.  A left central venous catheter overlies the mid mediastinum consistent with location in the origin of the brachial cephalic vein.  Again demonstrated, there is diffuse cardiac enlargement with pulmonary vascular congestion and perihilar edema. The perihilar infiltrates are mildly improved since previous study.  IMPRESSION: Appliances positioned as described.  Cardiac enlargement with pulmonary vascular congestion and perihilar edema.  Edema pattern is improving since previous study.   Original Report Authenticated By: Burman Nieves, M.D.    Dg Chest Port 1 View  11/05/2012  *RADIOLOGY REPORT*  Clinical  Data: Follow-up ventilated patient.  PORTABLE CHEST - 1 VIEW  Comparison: One-view chest 11/04/2012.  Findings: The heart is enlarged.  The patient remains intubated.  A left IJ line is stable.  An NG tube has been placed.  There is interval increase in diffuse interstitial edema.  Bibasilar airspace disease has progressed, left greater than right.  Pleural effusions are suspected, particularly on the left.  IMPRESSION:  1.  Interval increase in diffuse interstitial edema. 2.  Increasing bibasilar airspace disease.  In the face of increased edema, this likely represents worsening atelectasis. Infection is not excluded. 3.  Suspect bilateral pleural effusions, worse on the left. 4.  The support apparatus is stable.   Original Report Authenticated By: Marin Roberts, M.D.    Dg Chest Port 1 View  11/04/2012  *RADIOLOGY REPORT*  Clinical Data: Pulmonary infiltrates and effusions.  PORTABLE CHEST - 1 VIEW  Comparison: 11/03/2012  Findings: Endotracheal tube and central line appear in good position. The bilateral effusions appear diminished slightly. Persistent atelectasis at the left lung base medially.  Heart size and pulmonary vascularity are normal.  IMPRESSION: Slight decrease in the bilateral effusions.  Pulmonary vascularity is now normal.   Original Report Authenticated By: Francene Boyers, M.D.    Dg Chest Port 1 View  11/03/2012  *RADIOLOGY REPORT*  Clinical Data: Endotracheal tube and central line placement.  PORTABLE CHEST - 1 VIEW  Comparison: 11/02/2012  Findings: Interval placement of endotracheal tube with tip about 4.4 cm above the carina.  A left central venous catheter was placed with tip projected over the mid right mediastinum consistent with location at the origin of the brachial cephalic vein.  No pneumothorax.  Cardiac enlargement.  Bilateral perihilar airspace disease is unchanged.  IMPRESSION: Interval placement of endotracheal tube and central venous catheter.  No complication.  Otherwise no change in bilateral perihilar airspace disease.   Original Report Authenticated By: Burman Nieves, M.D.    Dg Chest Port 1 View  11/02/2012  *RADIOLOGY REPORT*  Clinical Data: Short of breath  PORTABLE CHEST - 1 VIEW  Comparison: 06/18/2011  Findings: Bilateral central basilar airspace disease.  Prominent left hilar soft tissue density with streaky opacities extending into the left upper lobe.  Bilateral pleural effusions are suspected.  Cardiomegaly.  IMPRESSION: Bilateral airspace disease in a pulmonary edema pattern.  Findings are worrisome for a central left upper lobe mass. Upright PA and lateral chest radiographic study is recommended.   Original Report Authenticated By: Jolaine Click, M.D.    Dg Shoulder Left  11/02/2012  *RADIOLOGY REPORT*  Clinical Data: Fall with shoulder pain.  LEFT SHOULDER - 2+ VIEW  Comparison: None.  Findings: No fracture or dislocation.  Degenerative changes are seen in the acromioclavicular joint.  Visualized portion of the left chest shows left apical pleural thickening.  IMPRESSION: Left acromioclavicular joint osteoarthritis.   Original Report Authenticated By: Leanna Battles, M.D.    Dg Knee Complete 4 Views Right  11/02/2012  *RADIOLOGY REPORT*  Clinical Data: Anterior right knee pain post fall, cellulitis right leg  RIGHT KNEE - COMPLETE 4+ VIEW  Comparison: None  Findings: Scattered soft tissue swelling particularly anteriorly and laterally at the right thigh and proximal right lower leg. Osseous demineralization. Joint spaces preserved. Bedding artifacts. Patellar spur at quadriceps tendon insertion. No acute fracture, dislocation, or bone destruction. Suspect small knee joint effusion.  IMPRESSION: No acute osseous abnormalities. Scattered soft tissue swelling.   Original Report Authenticated By: Ulyses Southward, M.D.     Microbiology: Recent Results (from the past 240 hour(s))  MRSA PCR SCREENING     Status: Normal   Collection Time   11/03/12  1:18  AM      Component Value Range Status Comment   MRSA by PCR NEGATIVE  NEGATIVE Final   URINE CULTURE     Status: Normal   Collection Time   11/03/12  1:18 AM      Component Value Range Status Comment   Specimen Description URINE, CATHETERIZED   Final    Special Requests NONE   Final    Culture  Setup Time 11/03/2012 09:24   Final    Colony Count NO GROWTH   Final    Culture NO GROWTH   Final    Report Status 11/04/2012 FINAL   Final   CULTURE, BLOOD (ROUTINE X 2)     Status: Normal   Collection Time   11/03/12  2:00 AM      Component Value Range Status Comment   Specimen Description BLOOD RIGHT ARM   Final    Special Requests     Final    Value: BOTTLES DRAWN AEROBIC AND ANAEROBIC 10CC BLUE,8CC RED   Culture  Setup Time 11/03/2012 09:28   Final    Culture NO GROWTH 5 DAYS   Final    Report Status 11/09/2012 FINAL   Final   CULTURE, BLOOD (ROUTINE X 2)     Status: Normal   Collection Time   11/03/12  2:15 AM      Component Value Range Status Comment   Specimen Description BLOOD RIGHT HAND   Final    Special Requests BOTTLES DRAWN AEROBIC ONLY 8CC   Final    Culture  Setup Time 11/03/2012 09:28   Final    Culture NO GROWTH 5 DAYS   Final  Report Status 11/09/2012 FINAL   Final   CULTURE, RESPIRATORY     Status: Normal   Collection Time   11/04/12 10:20 AM      Component Value Range Status Comment   Specimen Description TRACHEAL ASPIRATE   Final    Special Requests NONE   Final    Gram Stain     Final    Value: ABUNDANT WBC PRESENT,BOTH PMN AND MONONUCLEAR     RARE SQUAMOUS EPITHELIAL CELLS PRESENT     RARE GRAM POSITIVE COCCI IN PAIRS   Culture NO GROWTH 2 DAYS   Final    Report Status 11/06/2012 FINAL   Final      Labs: Basic Metabolic Panel:  Lab 11/07/12 1308 11/06/12 2330 11/06/12 0440 11/04/12 1850 11/04/12 0505 11/03/12 0525  NA 144 -- 145 143 141 137  K 3.5 -- 3.7 4.1 4.6 5.0  CL 101 -- 102 104 105 100  CO2 37* -- 36* 31 27 27   GLUCOSE 105* -- 132* 100* 105*  141*  BUN 17 -- 19 16 14  24*  CREATININE 0.61 -- 0.46* 0.59 0.48* 0.80  CALCIUM 8.3* -- 8.6 8.5 8.4 8.3*  MG 1.4* 1.6 1.6 -- -- --  PHOS -- -- -- -- -- --   Liver Function Tests:  Lab 11/07/12 0415 11/04/12 0505  AST 47* 43*  ALT 26 32  ALKPHOS 73 94  BILITOT 0.4 0.3  PROT 6.6 7.0  ALBUMIN 2.3* 2.3*   CBC:  Lab 11/07/12 0415 11/06/12 0440 11/04/12 0505 11/03/12 0525 11/02/12 1225  WBC 6.0 6.2 9.0 10.1 9.5  NEUTROABS -- 3.9 -- -- 7.0  HGB 11.1* 11.7* 12.6 12.1 12.4  HCT 35.8* 37.5 39.0 38.8 38.7  MCV 95.2 94.7 95.1 96.3 93.9  PLT 175 207 234 210 217   CBG:  Lab 11/09/12 0729 11/08/12 2114 11/08/12 1704 11/08/12 1145 11/08/12 0734  GLUCAP 113* 102* 103* 111* 124*     Signed:  Staphanie Harbison  [facility] 11/09/2012, 10:57 AM

## 2012-11-09 NOTE — Progress Notes (Signed)
Report given to Amy, nurse at receiving facility, [facility].

## 2012-11-09 NOTE — Progress Notes (Signed)
Clinical Social Worker received confirmation from RN that pt is ready for transport.  CSW phoned transport.  CSW to sign off, please re consult if needed.   Angelia Mould, MSW, [place] [phone]

## 2012-11-09 NOTE — Evaluation (Signed)
Occupational Therapy Evaluation Patient Details Name: Kathleen [name] MRN: [id number] DOB: 11/04/1945 Today's Date: 11/09/2012 Time: 4098-1191 OT Time Calculation (min): 40 min  OT Assessment / Plan / Recommendation Clinical Impression  67 yo female found down on floor and arrived to ED by EMS. Pt with acute Cellulitis and PNA. 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 Pt intubated on 11/03/12 and extubated 11/06/12. Pt morbid obesity with prolonged bed rest. Pt currently with yeast infection on abdomen. Ot to follow acutely. Recommend SNf for d/c planning    OT Assessment  Patient needs continued OT Services    Follow Up Recommendations  SNF    Barriers to Discharge      Equipment Recommendations  Wheelchair (measurements OT);Wheelchair cushion (measurements OT);3 in 1 bedside comode (bariatic - 400 pound capacity needed)    Recommendations for Other Services    Frequency  Min 2X/week    Precautions / Restrictions Precautions Precautions: Fall Precaution Comments: monitor O2 Restrictions Weight Bearing Restrictions: No   Pertinent Vitals/Pain Stable at this time DOE Dyspnea 2 out 4    ADL  Eating/Feeding: Set up (wet sounds with eating) Where Assessed - Eating/Feeding: Edge of bed (drinking from cup, coughing throughout meal) Grooming: Wash/dry face;Minimal assistance Where Assessed - Grooming: Unsupported sitting Lower Body Dressing: +2 Total assistance Lower Body Dressing: Patient Percentage: 0% Where Assessed - Lower Body Dressing: Supported sit to stand (pt is unable to reach knees in sitting) Toilet Transfer: +2 Total assistance Toilet Transfer: Patient Percentage: 40% Toilet Transfer Method: Sit to stand Toilet Transfer Equipment: Raised toilet seat with arms (or 3-in-1 over toilet) Transfers/Ambulation Related to ADLs: Pt completed EOB sit<>stand total+2 pt 40%. pt  standing from elevated surface. pt with anxiety immediately stating "im sliding I am going to fall Im  sliding" Pt required max v/c to decr anxiety to maintain safety. pt grabbing at therapist and tech due to anxiety. Pt taking 2 steps toward HOB. ADL Comments: pt supine attempting to eat from cups with straws. pt with coughing limiting ability to talk for prolonged period. Critical care NP Minor in room during evaluation and agreeable to SLP consult. Pt agreeable to EOB. pt sitting EOB for 15 minutes. pt unable to touch ground at EOB so trash can placed on side with towel as barrier to provided BIL LE support at EOB. pt required Mod <>min guard (A) at EOB. pt leaning on Lt foot plate to self feed with BIL UE unsupported. pt required rest breaks between bites and support to maintain EOB. Pt with limited ROM bil Ue due to habitus, DOE and decr strength     OT Diagnosis: Generalized weakness;Acute pain  OT Problem List: Decreased strength;Decreased range of motion;Decreased activity tolerance;Impaired balance (sitting and/or standing);Decreased safety awareness;Decreased knowledge of use of DME or AE;Decreased knowledge of precautions;Obesity;Pain OT Treatment Interventions: Self-care/ADL training;DME and/or AE instruction;Therapeutic activities;Patient/family education;Balance training   OT Goals Acute Rehab OT Goals OT Goal Formulation: With patient Time For Goal Achievement: 11/23/12 Potential to Achieve Goals: Good ADL Goals Pt Will Perform Grooming: with set-up;Sitting, edge of bed;Unsupported ADL Goal: Grooming - Progress: Goal set today Pt Will Perform Upper Body Bathing: with min assist;Sitting, edge of bed;Supported ADL Goal: Upper Body Bathing - Progress: Goal set today Pt Will Transfer to Toilet: with 2+ total assist;Ambulation;Extra wide 3-in-1;Regular height toilet ADL Goal: Toilet Transfer - Progress: Goal set today Miscellaneous OT Goals Miscellaneous OT Goal #1: Pt will complete bed mobility Min (A) with HOB 20 degrees or  less as precursor to adls OT Goal: Miscellaneous Goal #1 -  Progress: Goal set today  Visit Information  Last OT Received On: 11/09/12 Assistance Needed: +2    Subjective Data  Subjective: "I can eat better sitting up, feels good to be upright"- pt sitting on EOB during session Patient Stated Goal: to return home to living alone   Prior Functioning     Home Living Lives With: Alone Available Help at Discharge: Family;Available PRN/intermittently Type of Home: House Home Access: Level entry Home Layout: One level Bathroom Shower/Tub: Health visitor: Handicapped height Bathroom Accessibility: Yes How Accessible: Accessible via walker Home Adaptive Equipment: Grab bars in shower;Walker - rolling;Built-in shower seat Prior Function Level of Independence: Independent with assistive device(s);Needs assistance Needs Assistance: Light Housekeeping Light Housekeeping: Maximal Driving: Yes Vocation: Retired Musician: No difficulties Dominant Hand: Right         Vision/Perception     Cognition  Overall Cognitive Status: Appears within functional limits for tasks assessed/performed Arousal/Alertness: Awake/alert Orientation Level: Appears intact for tasks assessed Behavior During Session: [facility] for tasks performed    Extremity/Trunk Assessment Right Upper Extremity Assessment RUE ROM/Strength/Tone: Deficits RUE ROM/Strength/Tone Deficits: Strength grossly 4/5, AROM ~90 degrees shoulder flexion RUE Sensation: WFL - Light Touch RUE Coordination: WFL - gross motor Left Upper Extremity Assessment LUE ROM/Strength/Tone: Deficits LUE ROM/Strength/Tone Deficits: Strength grossly 4/5, AROM shoulder flexion ~90 degrees LUE Sensation: WFL - Light Touch LUE Coordination: WFL - gross motor Trunk Assessment Trunk Assessment: Kyphotic     Mobility Bed Mobility Rolling Right: 4: Min assist;With rail Rolling Left: 4: Min assist;With rail Left Sidelying to Sit: 3: Mod assist;HOB elevated Sit to Supine: 1:  +2 Total assist;With rail;HOB flat Sit to Supine: Patient Percentage: 40% ((A) with bil LE unable to lift into bed independently) Details for Bed Mobility Assistance: Pt required min v/c for sequence toward Lt side of bed. Pt pulling at bed rail. pt required (A) to place Lt Elbow into flexion and push into sitting. pt grabbing for foot plate with Rt UE to (A) with mobility. Pt limited by DOE , body habitus and decr activity tolerance.  Transfers Transfers: Sit to Stand;Stand to Sit Sit to Stand: 1: +2 Total assist;With upper extremity assist;From elevated surface;From bed Sit to Stand: Patient Percentage: 40% Stand to Sit: 1: +2 Total assist;With upper extremity assist;To bed Stand to Sit: Patient Percentage: 40% (decr control with descend to bed) Details for Transfer Assistance: Pt very anxious with mobility and requried max v/c for decr anxiety. Pt able to take one step each foot toward HOB. pt returned to sitting due to pt reporting feeling unstable. "I'm going to fall" Pt return to sitting and then to supine. pt required total +3 with HOB downward tilt for bed mobility.      Shoulder Instructions     Exercise     Balance Static Sitting Balance Static Sitting - Balance Support: Bilateral upper extremity supported;Feet supported Static Sitting - Level of Assistance: 4: Min assist Static Sitting - Comment/# of Minutes: pt tolerated EOB for ~15 minutes  (DOE ) Pt demonstrates ability to weight shift with MOd (A) at EOB. Pt weight shifting for pad placement   End of Session OT - End of Session Activity Tolerance: Patient tolerated treatment well Patient left: in bed;with call bell/phone within reach Nurse Communication: Mobility status;Precautions  GO     Lucile Shutters 11/09/2012, 11:21 AM Pager: [phone]

## 2012-11-12 ENCOUNTER — Emergency Department (HOSPITAL_COMMUNITY): Payer: Medicare Other

## 2012-11-12 ENCOUNTER — Other Ambulatory Visit: Payer: Self-pay

## 2012-11-12 ENCOUNTER — Inpatient Hospital Stay (HOSPITAL_COMMUNITY)
Admission: EM | Admit: 2012-11-12 | Discharge: 2012-11-18 | DRG: 871 | Disposition: A | Discharge status: Home / Self Care | Payer: Medicare Other | Attending: Emergency Medicine | Admitting: Emergency Medicine

## 2012-11-12 DIAGNOSIS — E876 Hypokalemia: Secondary | ICD-10-CM | POA: Diagnosis present

## 2012-11-12 DIAGNOSIS — A419 Sepsis, unspecified organism: Principal | ICD-10-CM

## 2012-11-12 DIAGNOSIS — Z86711 Personal history of pulmonary embolism: Secondary | ICD-10-CM

## 2012-11-12 DIAGNOSIS — D72829 Elevated white blood cell count, unspecified: Secondary | ICD-10-CM | POA: Diagnosis present

## 2012-11-12 DIAGNOSIS — N179 Acute kidney failure, unspecified: Secondary | ICD-10-CM

## 2012-11-12 DIAGNOSIS — E662 Morbid (severe) obesity with alveolar hypoventilation: Secondary | ICD-10-CM

## 2012-11-12 DIAGNOSIS — R7309 Other abnormal glucose: Secondary | ICD-10-CM | POA: Diagnosis present

## 2012-11-12 DIAGNOSIS — J9600 Acute respiratory failure, unspecified whether with hypoxia or hypercapnia: Secondary | ICD-10-CM | POA: Diagnosis present

## 2012-11-12 DIAGNOSIS — N289 Disorder of kidney and ureter, unspecified: Secondary | ICD-10-CM

## 2012-11-12 DIAGNOSIS — I2782 Chronic pulmonary embolism: Secondary | ICD-10-CM | POA: Diagnosis present

## 2012-11-12 DIAGNOSIS — R0609 Other forms of dyspnea: Secondary | ICD-10-CM

## 2012-11-12 DIAGNOSIS — Z7901 Long term (current) use of anticoagulants: Secondary | ICD-10-CM

## 2012-11-12 DIAGNOSIS — Z79899 Other long term (current) drug therapy: Secondary | ICD-10-CM

## 2012-11-12 DIAGNOSIS — E870 Hyperosmolality and hypernatremia: Secondary | ICD-10-CM | POA: Diagnosis present

## 2012-11-12 DIAGNOSIS — Z6841 Body Mass Index (BMI) 40.0 and over, adult: Secondary | ICD-10-CM

## 2012-11-12 DIAGNOSIS — J9602 Acute respiratory failure with hypercapnia: Secondary | ICD-10-CM

## 2012-11-12 DIAGNOSIS — Z9981 Dependence on supplemental oxygen: Secondary | ICD-10-CM

## 2012-11-12 DIAGNOSIS — J9620 Acute and chronic respiratory failure, unspecified whether with hypoxia or hypercapnia: Secondary | ICD-10-CM

## 2012-11-12 DIAGNOSIS — I2699 Other pulmonary embolism without acute cor pulmonale: Secondary | ICD-10-CM | POA: Diagnosis present

## 2012-11-12 DIAGNOSIS — L0390 Cellulitis, unspecified: Secondary | ICD-10-CM

## 2012-11-12 DIAGNOSIS — F411 Generalized anxiety disorder: Secondary | ICD-10-CM | POA: Diagnosis present

## 2012-11-12 DIAGNOSIS — I2789 Other specified pulmonary heart diseases: Secondary | ICD-10-CM | POA: Diagnosis present

## 2012-11-12 DIAGNOSIS — E2749 Other adrenocortical insufficiency: Secondary | ICD-10-CM | POA: Diagnosis present

## 2012-11-12 DIAGNOSIS — J189 Pneumonia, unspecified organism: Secondary | ICD-10-CM

## 2012-11-12 DIAGNOSIS — G4733 Obstructive sleep apnea (adult) (pediatric): Secondary | ICD-10-CM

## 2012-11-12 DIAGNOSIS — G9340 Encephalopathy, unspecified: Secondary | ICD-10-CM

## 2012-11-12 MED ORDER — LORAZEPAM 2 MG/ML IJ SOLN
2.0000 mg | Freq: Once | INTRAMUSCULAR | Status: AC
  Administered 2012-11-13: 2 mg via INTRAVENOUS
  Filled 2012-11-12: qty 1

## 2012-11-12 MED ORDER — SODIUM CHLORIDE 0.9 % IV BOLUS (SEPSIS)
1000.0000 mL | INTRAVENOUS | Status: AC
  Administered 2012-11-12: 1000 mL via INTRAVENOUS

## 2012-11-12 MED ORDER — LORAZEPAM 2 MG/ML IJ SOLN
2.0000 mg | Freq: Once | INTRAMUSCULAR | Status: AC
  Administered 2012-11-12: 2 mg via INTRAVENOUS

## 2012-11-12 MED ORDER — LORAZEPAM 2 MG/ML IJ SOLN
INTRAMUSCULAR | Status: AC
  Administered 2012-11-12: 2 mg via INTRAVENOUS
  Filled 2012-11-12: qty 1

## 2012-11-12 NOTE — ED Notes (Signed)
Arrived via EMS   Reports found by nursing personnel at [facility] unresponsive

## 2012-11-12 NOTE — ED Provider Notes (Signed)
History     CSN: [id number]  Arrival date & time 11/12/12  2307   First MD Initiated Contact with Patient 11/12/12 2313     Chief complaint: Respiratory distress  (Consider location/radiation/quality/duration/timing/severity/associated sxs/prior treatment) The history is provided by the nursing home and the EMS personnel. The history is limited by the condition of the patient (unresponsive).   67 year old female is brought here from [facility] because of respiratory distress. She is completely unresponsive and unable to give any history. Paperwork with her states that she is full code.  Past Medical History  Diagnosis Date  . HTN (hypertension)   . Hyperlipemia   . GERD (gastroesophageal reflux disease)   . Obesity   . Sleep apnea     she has not been on CPAP  . PE (pulmonary embolism) 05/2011  . DOE (dyspnea on exertion) 01/22/2012  . Hematuria 01/22/2012    Past Surgical History  Procedure Date  . Total abdominal hysterectomy w/ bilateral salpingoophorectomy     Family History  Problem Relation Age of Onset  . Adopted: Yes    History  Substance Use Topics  . Smoking status: Never Smoker   . Smokeless tobacco: Never Used  . Alcohol Use: No    OB History    Grav Para Term Preterm Abortions TAB SAB Ect Mult Living                  Review of Systems  Unable to perform ROS: Mental status change    Allergies  Review of patient's allergies indicates no known allergies.  Home Medications   Current Outpatient Rx  Name  Route  Sig  Dispense  Refill  . IPRATROPIUM-ALBUTEROL 18-103 MCG/ACT IN AERO   Inhalation   Inhale 2 puffs into the lungs 4 (four) times daily.         . ATORVASTATIN CALCIUM 40 MG PO TABS   Oral   Take 40 mg by mouth at bedtime.          Marland Kitchen VITAMIN D 1000 UNITS PO TABS   Oral   Take 4,000 Units by mouth daily.         Marland Kitchen EZETIMIBE 10 MG PO TABS   Oral   Take 10 mg by mouth at bedtime.          . FUROSEMIDE 40 MG PO TABS    Oral   Take 1 tablet (40 mg total) by mouth daily.         Marland Kitchen LORAZEPAM 0.5 MG PO TABS   Oral   Take 0.5 mg by mouth every 12 (twelve) hours as needed. For anxiety         . METFORMIN HCL 500 MG PO TABS   Oral   Take 1 tablet (500 mg total) by mouth 2 (two) times daily with a meal.         . MOEXIPRIL HCL 15 MG PO TABS   Oral   Take 15 mg by mouth daily.          . NEBIVOLOL HCL 10 MG PO TABS   Oral   Take 1 tablet (10 mg total) by mouth daily.         . NYSTATIN 100000 UNIT/GM EX POWD   Topical   Apply 100,000 g topically 3 (three) times daily.         Marland Kitchen OMEPRAZOLE 40 MG PO CPDR   Oral   Take 1 capsule (40 mg total) by mouth daily.         Marland Kitchen  WARFARIN SODIUM 5 MG PO TABS   Oral   Take 5 mg by mouth See admin instructions. 5mg  daily Except 7.5mg  Tuesdays and Thursdays only           BP 73/42  Pulse 83  Ht 5\' 5"  (1.651 m)  Wt 356 lb (161.481 kg)  BMI 59.24 kg/m2  SpO2 100%  Physical Exam  Nursing note and vitals reviewed. Morbidly obese 67 year old female, who is unresponsive with virtually no respiratory effort. Vital signs are significant for hypertension with blood pressure 73/42. Oxygen saturation is 88%, which is hypoxic. Head is normocephalic and atraumatic. PERRLA. Oropharynx is clear. Neck is supple without adenopathy or JVD. Back has no presacral edema. Lungs have very little airflow without rales, wheezes, or rhonchi. Chest movement is symmetric. Heart has regular rate and rhythm without murmur. Abdomen is soft, flat, without masses or hepatosplenomegaly. Extremities have 2+ edema with marked venous stasis changes. Skin is warm and dry without rash. Neurologic: She is completely unresponsive but without any obvious focal motor deficits. Cranial nerves are grossly intact..   ED Course  Procedures (including critical care time)  INTUBATION Performed by: [facility]  Required items: required blood products, implants, devices, and  special equipment available Patient identity confirmed: provided demographic data and hospital-assigned identification number Time out: Immediately prior to procedure a "time out" was called to verify the correct patient, procedure, equipment, support staff and site/side marked as required.  Indications: Respiratory failure   Intubation method: Laryngoscopy   Preoxygenation: BVM  Sedatives: None  Paralytic: None   Tube Size: 8 cuffed  Post-procedure assessment: chest rise and ETCO2 monitor Breath sounds: equal and absent over the epigastrium Tube secured with: ETT holder Chest x-ray interpreted by radiologist and me.  Chest x-ray findings: endotracheal tube in appropriate position  Patient tolerated the procedure well with no immediate complications.   Results for orders placed during the hospital encounter of 11/12/12  CBC WITH DIFFERENTIAL      Component Value Range   WBC 6.7  4.0 - 10.5 K/uL   RBC 3.69 (*) 3.87 - 5.11 MIL/uL   Hemoglobin 11.2 (*) 12.0 - 15.0 g/dL   HCT 62.9  52.8 - 41.3 %   MCV 100.8 (*) 78.0 - 100.0 fL   MCH 30.4  26.0 - 34.0 pg   MCHC 30.1  30.0 - 36.0 g/dL   RDW 24.4 (*) 01.0 - 27.2 %   Platelets 230  150 - 400 K/uL   Neutrophils Relative 70  43 - 77 %   Neutro Abs 4.7  1.7 - 7.7 K/uL   Lymphocytes Relative 17  12 - 46 %   Lymphs Abs 1.2  0.7 - 4.0 K/uL   Monocytes Relative 12  3 - 12 %   Monocytes Absolute 0.8  0.1 - 1.0 K/uL   Eosinophils Relative 1  0 - 5 %   Eosinophils Absolute 0.1  0.0 - 0.7 K/uL   Basophils Relative 0  0 - 1 %   Basophils Absolute 0.0  0.0 - 0.1 K/uL  COMPREHENSIVE METABOLIC PANEL      Component Value Range   Sodium 135  135 - 145 mEq/L   Potassium 5.4 (*) 3.5 - 5.1 mEq/L   Chloride 92 (*) 96 - 112 mEq/L   CO2 41 (*) 19 - 32 mEq/L   Glucose, Bld 150 (*) 70 - 99 mg/dL   BUN 37 (*) 6 - 23 mg/dL   Creatinine, Ser 5.36 (*) 0.50 -  1.10 mg/dL   Calcium 8.7  8.4 - 16.1 mg/dL   Total Protein 7.5  6.0 - 8.3 g/dL   Albumin  2.5 (*) 3.5 - 5.2 g/dL   AST 43 (*) 0 - 37 U/L   ALT 27  0 - 35 U/L   Alkaline Phosphatase 69  39 - 117 U/L   Total Bilirubin 0.2 (*) 0.3 - 1.2 mg/dL   GFR calc non Af Amer 22 (*) >90 mL/min   GFR calc Af Amer 26 (*) >90 mL/min  TROPONIN I      Component Value Range   Troponin I <0.30  <0.30 ng/mL  PROTIME-INR      Component Value Range   Prothrombin Time 37.8 (*) 11.6 - 15.2 seconds   INR 4.18 (*) 0.00 - 1.49  BLOOD GAS, ARTERIAL      Component Value Range   FIO2 100.00     Delivery systems VENTILATOR     Mode PRESSURE REGULATED VOLUME CONTROL     VT 550     Rate 16     Peep/cpap 5.0     pH, Arterial 7.391  7.350 - 7.450   pCO2 arterial 59.2 (*) 35.0 - 45.0 mmHg   pO2, Arterial 248.0 (*) 80.0 - 100.0 mmHg   Bicarbonate 35.1 (*) 20.0 - 24.0 mEq/L   TCO2 31.5  0 - 100 mmol/L   Acid-Base Excess 9.9 (*) 0.0 - 2.0 mmol/L   O2 Saturation 99.5     Patient temperature 37.0     Collection site RIGHT BRACHIAL     Drawn by [id number]     Sample type ARTERIAL     Allens test (pass/fail) NOT INDICATED (*) PASS  LACTIC ACID, PLASMA      Component Value Range   Lactic Acid, Venous 1.5  0.5 - 2.2 mmol/L   Dg Chest Portable 1 View  11/12/2012  *RADIOLOGY REPORT*  Clinical Data: Endotracheal tube placement.  Respiratory failure.  PORTABLE CHEST - 1 VIEW  Comparison: Chest radiograph performed 11/06/2012  Findings: The patient's endotracheal tube is seen ending 3-4 cm above the carina.  The lungs are slightly hypoexpanded.  Vascular congestion is noted, with bilateral central and left basilar airspace opacification, most compatible with pulmonary edema.  Small bilateral pleural effusions are likely present.  No pneumothorax is seen.  The cardiomediastinal silhouette is borderline normal in size.  No acute osseous abnormalities are identified.  IMPRESSION:  1.  Endotracheal tube seen ending 3-4 cm above the carina. 2.  Lungs slightly hypoexpanded.  Vascular congestion, with bilateral central and left  basilar airspace opacification, most compatible with significantly worsening pulmonary edema.  Small bilateral pleural effusions likely present.   Original Report Authenticated By: Tonia Ghent, M.D.        Date: 11/12/2012  Rate: 70  Rhythm: normal sinus rhythm  QRS Axis: right  Intervals: normal  ST/T Wave abnormalities: normal  Conduction Disutrbances:right bundle branch block  Narrative Interpretation:  Low voltage, right bundle branch block, right axis deviation with possible left posterior fascicular block. When compared with ECG of 06/18/2011, no significant changes are seen.  Old EKG Reviewed: unchanged    1. Acute-on-chronic respiratory failure   2. Renal insufficiency     CRITICAL CARE Performed by: Dione Booze   Total critical care time: 120 minutes  Critical care time was exclusive of separately billable procedures and treating other patients.  Critical care was necessary to treat or prevent imminent or life-threatening deterioration.  Critical care was time spent  personally by me on the following activities: development of treatment plan with patient and/or surrogate as well as nursing, discussions with consultants, evaluation of patient's response to treatment, examination of patient, obtaining history from patient or surrogate, ordering and performing treatments and interventions, ordering and review of laboratory studies, ordering and review of radiographic studies, pulse oximetry and re-evaluation of patient's condition.   MDM  Patient arrived with virtually no respiratory effort and was only able to maintain oxygen saturations of 91-92% on 100% oxygen. He causes she had such little respiratory effort, it was felt to be very unlikely that she would benefit from BiPAP so she was electively intubated.  Following intubation, she did become hypotensive and responded to IV fluids. Foley catheter was placed showing no urine output. Case has been discussed with Dr.  Craige Cotta of pulmonary critical care service who agrees to accept the patient in transfer to the [facility]. Of note, she does have history of pulmonary embolism and INR is supratherapeutic. She is not having any active bleeding, so no attempt is made to lower her INR. At this time, beta natruretic protein level is still pending the      Dione Booze, MD 11/13/12 [phone]

## 2012-11-12 NOTE — ED Notes (Addendum)
Intubated by Dr Preston Fleeting   #8  ETT  Taped at 24 at teeth

## 2012-11-13 ENCOUNTER — Encounter (HOSPITAL_COMMUNITY): Payer: Self-pay | Admitting: Emergency Medicine

## 2012-11-13 ENCOUNTER — Inpatient Hospital Stay (HOSPITAL_COMMUNITY): Payer: Medicare Other

## 2012-11-13 DIAGNOSIS — J9600 Acute respiratory failure, unspecified whether with hypoxia or hypercapnia: Secondary | ICD-10-CM

## 2012-11-13 DIAGNOSIS — G9340 Encephalopathy, unspecified: Secondary | ICD-10-CM

## 2012-11-13 DIAGNOSIS — N179 Acute kidney failure, unspecified: Secondary | ICD-10-CM

## 2012-11-13 DIAGNOSIS — A419 Sepsis, unspecified organism: Secondary | ICD-10-CM

## 2012-11-13 DIAGNOSIS — G4733 Obstructive sleep apnea (adult) (pediatric): Secondary | ICD-10-CM

## 2012-11-13 DIAGNOSIS — J189 Pneumonia, unspecified organism: Secondary | ICD-10-CM

## 2012-11-13 DIAGNOSIS — J9620 Acute and chronic respiratory failure, unspecified whether with hypoxia or hypercapnia: Secondary | ICD-10-CM

## 2012-11-13 DIAGNOSIS — E662 Morbid (severe) obesity with alveolar hypoventilation: Secondary | ICD-10-CM

## 2012-11-13 LAB — CBC WITH DIFFERENTIAL/PLATELET
Basophils Absolute: 0 10*3/uL (ref 0.0–0.1)
Basophils Relative: 0 % (ref 0–1)
HCT: 37.2 % (ref 36.0–46.0)
MCHC: 30.1 g/dL (ref 30.0–36.0)
Monocytes Absolute: 0.8 10*3/uL (ref 0.1–1.0)
Neutro Abs: 4.7 10*3/uL (ref 1.7–7.7)
RDW: 15.7 % — ABNORMAL HIGH (ref 11.5–15.5)

## 2012-11-13 LAB — BASIC METABOLIC PANEL
BUN: 39 mg/dL — ABNORMAL HIGH (ref 6–23)
Chloride: 96 mEq/L (ref 96–112)
Creatinine, Ser: 1.65 mg/dL — ABNORMAL HIGH (ref 0.50–1.10)
GFR calc Af Amer: 36 mL/min — ABNORMAL LOW (ref >90)
GFR calc non Af Amer: 31 mL/min — ABNORMAL LOW (ref >90)
Glucose, Bld: 88 mg/dL (ref 70–99)

## 2012-11-13 LAB — BLOOD GAS, ARTERIAL
Acid-Base Excess: 9.4 mmol/L — ABNORMAL HIGH (ref 0.0–2.0)
FIO2: 1 %
MECHVT: 550 mL
MECHVT: 550 mL
PEEP: 5 cmH2O
Patient temperature: 37
Patient temperature: 98.6
TCO2: 34.3 mmol/L (ref 0–100)
pH, Arterial: 7.391 (ref 7.350–7.450)

## 2012-11-13 LAB — URINALYSIS, ROUTINE W REFLEX MICROSCOPIC
Glucose, UA: 100 mg/dL — AB
Leukocytes, UA: NEGATIVE
Nitrite: NEGATIVE
Protein, ur: 30 mg/dL — AB
Specific Gravity, Urine: 1.024 (ref 1.005–1.030)
Urobilinogen, UA: 1 mg/dL (ref 0.0–1.0)
pH: 5 (ref 5.0–8.0)

## 2012-11-13 LAB — PROCALCITONIN: Procalcitonin: 0.14 ng/mL

## 2012-11-13 LAB — CBC
HCT: 36.8 % (ref 36.0–46.0)
Hemoglobin: 11.2 g/dL — ABNORMAL LOW (ref 12.0–15.0)
MCHC: 30.4 g/dL (ref 30.0–36.0)
MCV: 96.8 fL (ref 78.0–100.0)
RDW: 15.6 % — ABNORMAL HIGH (ref 11.5–15.5)

## 2012-11-13 LAB — COMPREHENSIVE METABOLIC PANEL
AST: 43 U/L — ABNORMAL HIGH (ref 0–37)
Albumin: 2.5 g/dL — ABNORMAL LOW (ref 3.5–5.2)
Calcium: 8.7 mg/dL (ref 8.4–10.5)
Chloride: 92 mEq/L — ABNORMAL LOW (ref 96–112)
Creatinine, Ser: 2.17 mg/dL — ABNORMAL HIGH (ref 0.50–1.10)
Total Bilirubin: 0.2 mg/dL — ABNORMAL LOW (ref 0.3–1.2)

## 2012-11-13 LAB — URINE MICROSCOPIC-ADD ON

## 2012-11-13 LAB — CORTISOL: Cortisol, Plasma: 14.2 ug/dL

## 2012-11-13 LAB — GLUCOSE, CAPILLARY: Glucose-Capillary: 92 mg/dL (ref 70–99)

## 2012-11-13 LAB — CREATININE, URINE, RANDOM: Creatinine, Urine: 253.09 mg/dL

## 2012-11-13 MED ORDER — ASPIRIN 81 MG PO CHEW
324.0000 mg | CHEWABLE_TABLET | ORAL | Status: AC
  Administered 2012-11-13: 324 mg via ORAL
  Filled 2012-11-13: qty 4

## 2012-11-13 MED ORDER — INSULIN ASPART 100 UNIT/ML ~~LOC~~ SOLN
2.0000 [IU] | SUBCUTANEOUS | Status: DC
  Administered 2012-11-14 (×5): 2 [IU] via SUBCUTANEOUS
  Administered 2012-11-15: 4 [IU] via SUBCUTANEOUS
  Administered 2012-11-15 (×3): 2 [IU] via SUBCUTANEOUS
  Administered 2012-11-15 – 2012-11-16 (×7): 4 [IU] via SUBCUTANEOUS
  Administered 2012-11-16: 2 [IU] via SUBCUTANEOUS
  Administered 2012-11-17 (×4): 4 [IU] via SUBCUTANEOUS
  Administered 2012-11-17 – 2012-11-18 (×5): 2 [IU] via SUBCUTANEOUS

## 2012-11-13 MED ORDER — MIDAZOLAM HCL 2 MG/2ML IJ SOLN
1.0000 mg | INTRAMUSCULAR | Status: DC | PRN
  Administered 2012-11-14 (×4): 1 mg via INTRAVENOUS
  Administered 2012-11-15: 2 mg via INTRAVENOUS
  Filled 2012-11-13 (×3): qty 2

## 2012-11-13 MED ORDER — NEPRO/CARBSTEADY PO LIQD
1000.0000 mL | ORAL | Status: DC
  Filled 2012-11-13 (×2): qty 1000

## 2012-11-13 MED ORDER — ADULT MULTIVITAMIN LIQUID CH
5.0000 mL | Freq: Every day | ORAL | Status: DC
  Administered 2012-11-13 – 2012-11-18 (×6): 5 mL
  Filled 2012-11-13 (×6): qty 5

## 2012-11-13 MED ORDER — CHLORHEXIDINE GLUCONATE 0.12 % MT SOLN
15.0000 mL | Freq: Two times a day (BID) | OROMUCOSAL | Status: DC
Start: 2012-11-13 — End: 2012-11-18
  Administered 2012-11-13 – 2012-11-18 (×10): 15 mL via OROMUCOSAL
  Filled 2012-11-13 (×10): qty 15

## 2012-11-13 MED ORDER — FAMOTIDINE IN NACL 20-0.9 MG/50ML-% IV SOLN
20.0000 mg | INTRAVENOUS | Status: DC
  Administered 2012-11-13 – 2012-11-14 (×2): 20 mg via INTRAVENOUS
  Filled 2012-11-13 (×2): qty 50

## 2012-11-13 MED ORDER — SODIUM CHLORIDE 0.9 % IV SOLN
25.0000 ug/h | INTRAVENOUS | Status: DC
  Administered 2012-11-13: 50 ug/h via INTRAVENOUS
  Administered 2012-11-16: 25 ug/h via INTRAVENOUS
  Administered 2012-11-17: 50 ug/h via INTRAVENOUS
  Filled 2012-11-13 (×5): qty 50

## 2012-11-13 MED ORDER — HEPARIN SODIUM (PORCINE) 5000 UNIT/ML IJ SOLN: 5000.0000 [IU] | Freq: Three times a day (TID) | INTRAMUSCULAR | Status: DC

## 2012-11-13 MED ORDER — VANCOMYCIN HCL 10 G IV SOLR
1500.0000 mg | INTRAVENOUS | Status: DC
  Filled 2012-11-13: qty 1500

## 2012-11-13 MED ORDER — SODIUM CHLORIDE 0.9 % IV SOLN
250.0000 mL | INTRAVENOUS | Status: DC | PRN
  Administered 2012-11-16: 06:00:00 via INTRAVENOUS

## 2012-11-13 MED ORDER — VANCOMYCIN HCL 10 G IV SOLR
2500.0000 mg | Freq: Once | INTRAVENOUS | Status: AC
  Administered 2012-11-13: 2500 mg via INTRAVENOUS
  Filled 2012-11-13: qty 2500

## 2012-11-13 MED ORDER — BIOTENE DRY MOUTH MT LIQD
15.0000 mL | Freq: Four times a day (QID) | OROMUCOSAL | Status: DC
  Administered 2012-11-14 – 2012-11-18 (×20): 15 mL via OROMUCOSAL

## 2012-11-13 MED ORDER — SODIUM CHLORIDE 0.9 % IV SOLN
Freq: Once | INTRAVENOUS | Status: AC
  Administered 2012-11-13: 20 mL/h via INTRAVENOUS

## 2012-11-13 MED ORDER — ACETAMINOPHEN 650 MG RE SUPP
650.0000 mg | Freq: Once | RECTAL | Status: AC
  Administered 2012-11-13: 650 mg via RECTAL

## 2012-11-13 MED ORDER — FUROSEMIDE 10 MG/ML IJ SOLN
40.0000 mg | Freq: Four times a day (QID) | INTRAMUSCULAR | Status: AC
  Administered 2012-11-13 – 2012-11-14 (×4): 40 mg via INTRAVENOUS
  Filled 2012-11-13 (×4): qty 4

## 2012-11-13 MED ORDER — VANCOMYCIN HCL 10 G IV SOLR
2000.0000 mg | INTRAVENOUS | Status: DC
  Administered 2012-11-14: 2000 mg via INTRAVENOUS
  Filled 2012-11-13: qty 2000

## 2012-11-13 MED ORDER — IPRATROPIUM-ALBUTEROL 18-103 MCG/ACT IN AERO
6.0000 | INHALATION_SPRAY | Freq: Four times a day (QID) | RESPIRATORY_TRACT | Status: DC
  Administered 2012-11-13 (×2): 6 via RESPIRATORY_TRACT
  Administered 2012-11-13: 8 via RESPIRATORY_TRACT
  Administered 2012-11-14 – 2012-11-16 (×9): 6 via RESPIRATORY_TRACT
  Administered 2012-11-16: 7 via RESPIRATORY_TRACT
  Administered 2012-11-16 – 2012-11-17 (×4): 6 via RESPIRATORY_TRACT
  Administered 2012-11-17: 8 via RESPIRATORY_TRACT
  Administered 2012-11-17: 6 via RESPIRATORY_TRACT
  Administered 2012-11-18: 4 via RESPIRATORY_TRACT
  Administered 2012-11-18 (×2): 6 via RESPIRATORY_TRACT
  Filled 2012-11-13: qty 14.7

## 2012-11-13 MED ORDER — NOREPINEPHRINE BITARTRATE 1 MG/ML IJ SOLN
2.0000 ug/min | INTRAVENOUS | Status: DC
  Administered 2012-11-13 (×2): 2 ug/min via INTRAVENOUS
  Administered 2012-11-14: 5 ug/min via INTRAVENOUS
  Administered 2012-11-15: 7 ug/min via INTRAVENOUS
  Administered 2012-11-15: 7.5 ug/min via INTRAVENOUS
  Administered 2012-11-15: 8 ug/min via INTRAVENOUS
  Administered 2012-11-16: 10 ug/min via INTRAVENOUS
  Administered 2012-11-16: 12 ug/min via INTRAVENOUS
  Administered 2012-11-16: 6 ug/min via INTRAVENOUS
  Filled 2012-11-13 (×8): qty 4

## 2012-11-13 MED ORDER — PIPERACILLIN-TAZOBACTAM 3.375 G IVPB
3.3750 g | Freq: Three times a day (TID) | INTRAVENOUS | Status: DC
  Administered 2012-11-13 – 2012-11-16 (×10): 3.375 g via INTRAVENOUS
  Filled 2012-11-13 (×12): qty 50

## 2012-11-13 MED ORDER — FENTANYL BOLUS VIA INFUSION
25.0000 ug | Freq: Four times a day (QID) | INTRAVENOUS | Status: DC | PRN
  Filled 2012-11-13: qty 100

## 2012-11-13 MED ORDER — FAMOTIDINE IN NACL 20-0.9 MG/50ML-% IV SOLN: 20.0000 mg | Freq: Two times a day (BID) | INTRAVENOUS | Status: DC

## 2012-11-13 MED ORDER — SODIUM CHLORIDE 0.9 % IV SOLN
INTRAVENOUS | Status: DC
  Administered 2012-11-13: 06:00:00 via INTRAVENOUS

## 2012-11-13 MED ORDER — ASPIRIN 300 MG RE SUPP
300.0000 mg | RECTAL | Status: AC
  Filled 2012-11-13: qty 1

## 2012-11-13 MED ORDER — PRO-STAT SUGAR FREE PO LIQD
60.0000 mL | Freq: Four times a day (QID) | ORAL | Status: DC
  Administered 2012-11-13 – 2012-11-18 (×20): 60 mL
  Filled 2012-11-13 (×23): qty 60

## 2012-11-13 MED ORDER — LORAZEPAM 2 MG/ML IJ SOLN
INTRAMUSCULAR | Status: AC
  Administered 2012-11-13: 2 mg via INTRAVENOUS
  Filled 2012-11-13: qty 1

## 2012-11-13 MED ORDER — OSMOLITE 1.5 CAL PO LIQD
1000.0000 mL | ORAL | Status: DC
  Administered 2012-11-13 – 2012-11-17 (×5): 1000 mL
  Filled 2012-11-13 (×7): qty 1000

## 2012-11-13 MED ORDER — ACETAMINOPHEN 650 MG RE SUPP
RECTAL | Status: AC
  Administered 2012-11-13: 650 mg via RECTAL
  Filled 2012-11-13: qty 1

## 2012-11-13 MED ORDER — LORAZEPAM 2 MG/ML IJ SOLN
2.0000 mg | Freq: Once | INTRAMUSCULAR | Status: AC
  Administered 2012-11-13: 2 mg via INTRAVENOUS

## 2012-11-13 NOTE — ED Notes (Signed)
2 ml urine removed from foley catheter - sent to lab for urine culture in sterile syringe

## 2012-11-13 NOTE — ED Notes (Signed)
[name] reports patient was alert and talking yesterday and laughing.  States her attorney - Clarene Critchley met with her and she was discussing getting living will completed as she did not want to be on life support again.(in process)  States today she was much less interactive, told him she did not have any appetite, and had agreed to wear her BiPap tonight - had refused to wear the previous night.

## 2012-11-13 NOTE — ED Notes (Signed)
Kathleen [name] here - Kathleen [name]  Patient appears to respond with movement when she heard his voice.

## 2012-11-13 NOTE — Progress Notes (Signed)
INITIAL NUTRITION ASSESSMENT  DOCUMENTATION CODES Per approved criteria  -Morbid Obesity   INTERVENTION: Initiate TF via OG tube with Osmolite 1.5 at 10 ml/h, increase by 10 ml every 4 hours to goal rate of 20 ml/h with Prostat 60 ml QID to provide 1520 kcals (25 kcals/kg ideal weight and 65% of estimated needs), 150 gm protein (>100% estimated needs), 366 ml free water daily.  Liquid MVI daily via tube to help meet 100% DRI's.  NUTRITION DIAGNOSIS: Inadequate oral intake related to inability to eat as evidenced by NPO status.   Goal: Enteral nutrition to provide 60-70% of estimated calorie needs (22-25 kcals/kg ideal body weight) and >/= 90% of estimated protein needs, based on ASPEN guidelines for permissive underfeeding in critically ill obese individuals.  Monitor:  TF tolerance/adequacy, weight trend, labs, I/O  Reason for Assessment: MD Consult for TF initiation and management.  67 y.o. female  Admitting Dx: Acute respiratory failure with hypercapnia  ASSESSMENT: Patient was brought to ED 1/24 after developing respiratory failure, confusion, and hypotension at home. She was recently discharged from [facility] after hypercapnic respiratory failure and cellulitis in the setting of a fall.   Patient is currently intubated on ventilator support.  MV: 10 Temp:Temp (24hrs), Avg:100.4 F (38 C), Min:99.2 F (37.3 C), Max:102 F (38.9 C)   Spoke with Dr. Sherrine Maples regarding TF options.  Decided that a fluid restricted formula would be best for patient.  RD to order and manage TF.    Noted plans for meeting with family (first cousin) with likelihood to extubate soon with no intention to re-intubate.    Height: Ht Readings from Last 1 Encounters:  11/12/12 5\' 5"  (1.651 m)    Weight: Wt Readings from Last 1 Encounters:  11/13/12 345 lb 0.3 oz (156.5 kg)    Ideal Body Weight: 56.8 kg  % Ideal Body Weight: 276%  Wt Readings from Last 10 Encounters:  11/13/12 345 lb 0.3 oz (156.5  kg)  11/09/12 356 lb 7.7 oz (161.7 kg)  02/03/12 333 lb 1.6 oz (151.093 kg)  01/22/12 319 lb 4.8 oz (144.834 kg)  11/03/12  354 lb 15.1 oz (161 kg)   Usual Body Weight: 354 lb (last admission 1.5 weeks ago)  % Usual Body Weight: 97%  BMI:  Body mass index is 57.41 kg/(m^2). class 3, extreme obesity  Estimated Nutritional Needs: Kcal: 2350 Protein: 140 gm Fluid: 2.4 L  Skin: fungal appearing erythema in folds around pannus, no frank cellulitis; small area of redness on sacrum  Diet Order:  NPO  EDUCATION NEEDS: -Education not appropriate at this time   Intake/Output Summary (Last 24 hours) at 11/13/12 1116 Last data filed at 11/13/12 1100  Gross per 24 hour  Intake 2520.67 ml  Output    660 ml  Net 1860.67 ml    Last BM: 1/24   Labs:   Lab 11/13/12 0540 11/12/12 2325 11/07/12 0415 11/06/12 2330  NA 138 135 144 --  K 4.8 5.4* 3.5 --  CL 96 92* 101 --  CO2 33* 41* 37* --  BUN 39* 37* 17 --  CREATININE 1.65* 2.17* 0.61 --  CALCIUM 8.0* 8.7 8.3* --  MG -- -- 1.4* 1.6  PHOS -- -- -- --  GLUCOSE 88 150* 105* --    CBG (last 3)   Basename 11/13/12 0731 11/13/12 0359  GLUCAP 87 92    Scheduled Meds:   . albuterol-ipratropium  6-8 puff Inhalation Q6H  . famotidine (PEPCID) IV  20 mg  Intravenous Q24H  . feeding supplement (NEPRO CARB STEADY)  1,000 mL Per Tube Q24H  . insulin aspart  2-6 Units Subcutaneous Q4H  . piperacillin-tazobactam (ZOSYN)  IV  3.375 g Intravenous Q8H  . vancomycin  2,000 mg Intravenous Q24H    Continuous Infusions:   . fentaNYL infusion INTRAVENOUS 50 mcg/hr (11/13/12 0549)  . norepinephrine (LEVOPHED) Adult infusion 2 mcg/min (11/13/12 0602)    Past Medical History  Diagnosis Date  . HTN (hypertension)   . Hyperlipemia   . GERD (gastroesophageal reflux disease)   . Obesity   . Sleep apnea     she has not been on CPAP  . PE (pulmonary embolism) 05/2011  . DOE (dyspnea on exertion) 01/22/2012  . Hematuria 01/22/2012    Past  Surgical History  Procedure Date  . Total abdominal hysterectomy w/ bilateral salpingoophorectomy     Joaquin Courts, RD, LDN, CNSC Pager# [phone] After Hours Pager# [phone]

## 2012-11-13 NOTE — ED Notes (Signed)
Small amount of urine beginning to show in catheter tubing again.

## 2012-11-13 NOTE — Progress Notes (Signed)
Spoke with first cousin, informed me that patient clearly wanted to be NCB but did not sign papers.  Will make LCB with no CPR, cardioversion or anti-arrhythemics.  Once extubated will not reintubate.  Alyson Reedy, M.D. [facility] Pulmonary/Critical Care Medicine. Pager: [phone]. After hours pager: [phone].

## 2012-11-13 NOTE — ED Notes (Signed)
PASTOR:  Larry Sierras  [phone] ATTORNEYClarene Critchley  H# [phone]  Office # [phone]

## 2012-11-13 NOTE — ED Notes (Signed)
Patient becoming restless with activity of moving from one stretcher to transport stretcher.  Respiratory rate increased - Patient responding by squeezing EMT hand, blinks her eyes.  Ativan 2 mg given prior to leaving ED for transport to [facility]

## 2012-11-13 NOTE — Procedures (Signed)
Central Venous Catheter Insertion Procedure Note Kathleen [name] [id number] [birth year]  Procedure: Insertion of Central Venous Catheter Indications: Assessment of intravascular volume and Drug and/or fluid administration  Procedure Details Consent: Unable to obtain consent because of emergent medical necessity. Time Out: Verified patient identification, verified procedure, site/side was marked, verified correct patient position, special equipment/implants available, medications/allergies/relevent history reviewed, required imaging and test results available.  Performed  Maximum sterile technique was used including antiseptics, cap, gloves, gown, hand hygiene, mask and sheet. Skin prep: Chlorhexidine; local anesthetic administered A antimicrobial bonded/coated triple lumen catheter was placed in the left subclavian vein using the Seldinger technique.   Evaluation Blood flow good Complications: No apparent complications Patient did tolerate procedure well. Chest X-ray ordered to verify placement.  CXR: normal.  MCQUAID, DOUGLAS 11/13/2012, 5:34 AM

## 2012-11-13 NOTE — ED Notes (Signed)
CRITICAL VALUE ALERT  Critical value received:  CO2 59.2  Date of notification:  11/14/2011  Time of notification:  0015  Critical value read back:yes  Nurse who received alert:  [email or web address],rn  MD notified (1st page):  Dr. Preston Fleeting  Time of first page:  0015  MD notified (2nd page):  Time of second page:  Responding MD:  Dr. Preston Fleeting  Time MD responded:  [phone]

## 2012-11-13 NOTE — ED Notes (Signed)
Laboratory called with Critical value  CO 2  =  41    Dr Preston Fleeting notified

## 2012-11-13 NOTE — ED Notes (Signed)
No identified purposeful movement however patient did move feet and hands when pastor prayed with her.  He will be going to [facility] to be available.

## 2012-11-13 NOTE — H&P (Signed)
PULMONARY  / CRITICAL CARE MEDICINE   Name: Kathleen [name] MRN: [id number] DOB: 1946-05-22    LOS: 1  REFERRING MD : [facility]  CHIEF COMPLAINT:  Hypercapnic respiratory failure, Shock  BRIEF PATIENT DESCRIPTION:  67 yo with history of PE (therapeutic INR on Coumadin), OSA (noncompliant with BiPAP), on home oxygen and chronic opioids brought to ED 1/24 after developing respiratory failure, confusion, and hypotension at home.  She was recently discharged from [facility] after hypercapnic respiratory failure and cellulitis in the setting of a fall.   HPI: See brief patient descripton.  Additionally, it was noted by her pastor (only contact available) that she was becoming more somnolent and coughing more at her nursing facility the day prior to hospital admission (1/23).  As the night wore on she became unresponsive and was brought to [facility] ED where she was intubated without sedation.  He was unaware of any fever or chills.  However, he notes that she refused to wear her CPAP while at the nursing facility.  He reports that she had been trying to change her HCPOA to him, but she never signed the paperwork.  He has tried to contact her next of kin (a step-granddaughter), but has been unable.    LINES / TUBES: Foley 1/24 >>> Left Subclavian 1/24 >>>  CULTURES: 1/24 - Blood culture x 2 >> 1/24 - Urine culture >>  1/24 - Respiratory culture >>   ANTIBIOTICS: Vancomycin 1/24 >> Zosyn 1/24 >>    SIGNIFICANT EVENTS:  1/13 - Admitted for cellulitis, transferred to ICU for hypercarbic respiratory failure 1/14 - Remains intubated, hypercarbia improving. SW consulted to help start search for SNF. 1/17 - Extubated. 1/20 - discharged to SNF =========== 1/24 Admitted from SNF >> [facility] >> [facility]    SUBJECTIVE/ INTERVAL HISTORY:   VITAL SIGNS: Temp:  [100.1 F (37.8 C)-102 F (38.9 C)] 100.1 F (37.8 C) (01/24 0403) Pulse Rate:  [64-83] 64  (01/24 0400) Resp:  [16-19] 19  (01/24 0400) BP:  (61-85)/(33-65) 85/65 mmHg (01/24 0400) SpO2:  [88 %-100 %] 100 % (01/24 0400) FiO2 (%):  [100 %] 100 % (01/24 0347) Weight:  [161.481 kg (356 lb)] 161.481 kg (356 lb) (01/23 2300) HEMODYNAMICS:   VENTILATOR SETTINGS: Vent Mode:  [-] PRVC FiO2 (%):  [100 %] 100 % Set Rate:  [16 bmp-18 bmp] 18 bmp Vt Set:  [500 mL-550 mL] 500 mL PEEP:  [5 cmH20] 5 cmH20 Plateau Pressure:  [30 cmH20-34 cmH20] 30 cmH20  INTAKE / OUTPUT: Intake/Output      01/23 0701 - 01/24 0700   I.V. (mL/kg) 1500 (9.3)   Total Intake(mL/kg) 1500 (9.3)   Urine (mL/kg/hr) 10 (0)   Total Output 10   Net +1490        PHYSICAL EXAMINATION:  Gen: morbidly obese, chronically ill appearing HEENT: NCAT, PERRL, ETT in place PULM: Insp wheeze R > L, breath sounds diminished on L CV: Distant, barely audible AB: BS+, soft, nontender Ext: some ankle and lower leg edema, warm Neuro: Sedated on vent, currently not following commands Derm: fungal appearing erythema in folds around pannus, no frank cellulitis; small area of redness on sacrum   IMAGING:  Dg Chest Portable 1 View  11/12/2012  *RADIOLOGY REPORT*  Clinical Data: Endotracheal tube placement.  Respiratory failure.  PORTABLE CHEST - 1 VIEW  Comparison: Chest radiograph performed 11/06/2012  Findings: The patient's endotracheal tube is seen ending 3-4 cm above the carina.  The lungs are slightly hypoexpanded.  Vascular congestion is noted, with bilateral central and left basilar airspace opacification, most compatible with pulmonary edema.  Small bilateral pleural effusions are likely present.  No pneumothorax is seen.  The cardiomediastinal silhouette is borderline normal in size.  No acute osseous abnormalities are identified.  IMPRESSION:  1.  Endotracheal tube seen ending 3-4 cm above the carina. 2.  Lungs slightly hypoexpanded.  Vascular congestion, with bilateral central and left basilar airspace opacification, most compatible with significantly worsening pulmonary  edema.  Small bilateral pleural effusions likely present.   Original Report Authenticated By: Tonia Ghent, M.D.    EKG: NSR, RBBB, TWI Ant precordial leads  M DIAGNOSES: Principal Problem:  *Acute respiratory failure with hypercapnia Active Problems:  Personal history of pulmonary embolism  Acute encephalopathy  Morbid obesity  Obesity hypoventilation syndrome  Septic shock(785.52)  AKI (acute kidney injury)  HCAP (healthcare-associated pneumonia)   ASSESSMENT / PLAN:  PULMONARY  Lab 11/12/12 2358 11/06/12 1026  PHART 7.391 7.358  PCO2ART 59.2* 70.3*  PO2ART 248.0* 86.0  HCO3 35.1* 39.5*  TCO2 31.5 42   A:  1) VDRF secondary to progressive chronic hypercarbic respiratory failure in setting of untreated OSA/OHS - was not using CPAP at nursing facility, so presumably worsening hypercapnea led to her admission/unresponsiveness  2) HCAP? 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  4) Suspected pulmonary hypertension - in setting of uncontrolled OSA  P:   - full vent support - abg now - daily abg/sbt/wua/cxr -  q6h BD. - CPAP mandatory q hs after extubation - see ID   CARDIOVASCULAR  Lab 11/12/12 2325  CKTOTAL --  CKMB --  TROPONINI <0.30   No results found for this basename: PROBNP:3 in the last 168 hours A: 1) Shock, unclear etiology: volume depletion? Septic or cardiogenic? No ischemic changes and no clear source of infection  2) Chronic coumadin therapy 2/2 PE - INR supratherapeutic on admission  P:  - Hold home Univasc / Metoprolol. - CVP monitoring - Avoid aggressive IVF considering she looks volume up - Levophed as needed for MAP 65 - repeat lactic acid - treat as septic empirically - repeat troponin, EKG    RENAL  Lab 11/12/12 2325 11/07/12 0415 11/06/12 0440  NA 135 144 145  K 5.4* 3.5 3.7  CL 92* 101 102  CO2 41* 37* 36*  BUN  37* 17 19  CREATININE 2.17* 0.61 0.46*  GLUCOSE 150* 105* 132*   A: AKI, uncertain etiology; presumably pre-renal vs due to hypotension/sepsis P:  - foley - monitor uop - check renal lytes - consider ultrasound if no improvement with IVF - gentle IVF   GASTROINTESTINAL  Lab 11/12/12 2325 11/07/12 0415  AST 43* 47*  ALT 27 26  ALKPHOS 69 73  BILITOT 0.2* 0.4  PROT 7.5 6.6  ALBUMIN 2.5* 2.3*   A: No acute issues. P:  - None.   HEMATOLOGIC  Lab 11/12/12 2325 11/09/12 0645 11/08/12 0715 11/07/12 0415 11/06/12 0440  WBC 6.7 -- -- 6.0 6.2  HGB 11.2* -- -- 11.1* 11.7*  HCT 37.2 -- -- 35.8* 37.5  PLT 230 -- -- 175 207  APTT -- -- -- -- --  INR 4.18* 3.20* 2.87* -- --    A:  1) Chronic coumadin in setting of chronic PE - supratherapeutic on admit.   P:  - hold warfarin - daily PT/INR - start warfarin  per pharmacy consult, goal INR 2-3   INFECTIOUS  Lab 11/12/12 2330 11/12/12 2325 11/07/12 0415 11/06/12 0440  WBC -- 6.7 6.0 6.2  NEUTROABS -- 4.7 -- 3.9  LATICACIDVEN 1.5 -- -- --  PROCALCITON -- -- -- --    A:  1) SIRS/ Septic shock? Doesn't meet SIRS criteria but source of shock uncertain, will treat as septic shock for now; HCAP seems most likely given cough and slightly worse LLL infiltrate  P:  - f/u cultures - vanc/zosyn.   ENDOCRINE  Lab 11/09/12 1123 11/09/12 0729 11/08/12 2114 11/08/12 1704 11/08/12 1145  GLUCAP 102* 113* 102* 103* 111*   A:  1) Prediabetes - A1c 6.3 P:  - ICU hyperglycemia protocol - Will need diabetes RN to provide education   NEUROLOGIC / PSYCHIATRIC  A:  1) Acute encephalopathy secondary to acute on chronic hypercarbic respiratory failure - followed commands in [facility] ED after intubation 2) Anxiety - has baseline anxiety on home PRN BZD.  P:  - fentanyl and versed as needed for sedation - RASS goal 0  GLOBAL: A:  - Social situation: She went to a SNF but was non-compliant with CPAP.  Apparently she tried to change  her HCPOA and change her will to reflect limited life support (no longer than a week), however this was never made official.  Source: her pastor.    CLINICAL SUMMARY: 67 y/o morbidly obese female with hypercapnic respiratory failure and shock.  Unclear etiology of shock, presumably volume depletion vs sepsis.  CC time by me 90 minutes.  Yolonda Kida PCCM Pager: [phone] Cell: [phone] If no response, call [phone]

## 2012-11-13 NOTE — Progress Notes (Signed)
PULMONARY  / CRITICAL CARE MEDICINE   Name: Kathleen [name] MRN: [id number] DOB: [birth year]    LOS: 1  REFERRING MD : [facility]  CHIEF COMPLAINT:  Hypercapnic respiratory failure, Shock  BRIEF PATIENT DESCRIPTION:  67 yo with history of PE (therapeutic INR on Coumadin), OSA (noncompliant with BiPAP), on home oxygen and chronic opioids brought to ED 1/24 after developing respiratory failure, confusion, and hypotension at home.  She was recently discharged from [facility] after hypercapnic respiratory failure and cellulitis in the setting of a fall.   HPI: See brief patient descripton.  Additionally, it was noted by her pastor (only contact available) that she was becoming more somnolent and coughing more at her nursing facility the day prior to hospital admission (1/23).  As the night wore on she became unresponsive and was brought to [facility] ED where she was intubated without sedation.  He was unaware of any fever or chills.  However, he notes that she refused to wear her CPAP while at the nursing facility.  He reports that she had been trying to change her HCPOA to him, but she never signed the paperwork.  He contacted her next of kin (a step-granddaughter), who is on the way to the hospital. Per the pastor, he does not believe that Kathleen [name] wanted CPR, defibrillation, and would not like to be on the ventilator for an extended period. He says that she told him she definitely did not want a PEG; however, he is not legally her POA.  LINES / TUBES: Foley 1/24 >>> Left Subclavian 1/24 >>>  CULTURES: 1/24 - Blood culture x 2 >> 1/24 - Urine culture >>  1/24 - Respiratory culture >>   ANTIBIOTICS: Vancomycin 1/24 >> Zosyn 1/24 >>  SIGNIFICANT EVENTS:  1/13 - Admitted for cellulitis, transferred to ICU for hypercarbic respiratory failure 1/14 - Remains intubated, hypercarbia improving. SW consulted to help start search for SNF. 1/17 - Extubated. 1/20 - discharged to SNF =========== 1/24 Admitted from  SNF >> [facility] ED >> [facility], intubated   SUBJECTIVE/ INTERVAL HISTORY:   VITAL SIGNS: Temp:  [99.2 F (37.3 C)-102 F (38.9 C)] 99.2 F (37.3 C) (01/24 0737) Pulse Rate:  [57-83] 58  (01/24 0900) Resp:  [16-20] 18  (01/24 0900) BP: (61-116)/(33-75) 116/64 mmHg (01/24 0900) SpO2:  [88 %-100 %] 96 % (01/24 0900) FiO2 (%):  [40 %-100 %] 40 % (01/24 0900) Weight:  [345 lb 0.3 oz (156.5 kg)-356 lb (161.481 kg)] 345 lb 0.3 oz (156.5 kg) (01/24 0500) HEMODYNAMICS: CVP:  [14 mmHg-25 mmHg] 25 mmHg VENTILATOR SETTINGS: Vent Mode:  [-] PRVC FiO2 (%):  [40 %-100 %] 40 % Set Rate:  [16 bmp-18 bmp] 18 bmp Vt Set:  [500 mL-550 mL] 550 mL PEEP:  [5 cmH20] 5 cmH20 Plateau Pressure:  [25 cmH20-34 cmH20] 25 cmH20  INTAKE / OUTPUT: Intake/Output      01/23 0701 - 01/24 0700 01/24 0701 - 01/25 0700   I.V. (mL/kg) 1713.2 (10.9) 137.5 (0.9)   NG/GT  20   IV Piggyback 525 25   Total Intake(mL/kg) 2238.2 (14.3) 182.5 (1.2)   Urine (mL/kg/hr) 160 (0) 150   Total Output 160 150   Net +2078.2 +32.5         PHYSICAL EXAMINATION: Gen: morbidly obese, chronically ill appearing HEENT: NCAT, ETT in place PULM: Breath sounds diminished CV: Distant, barely audible AB: BS+, soft, nontender Ext: Lower leg edema, feet cold, DP pulse present with doppler, non-palpable Neuro: Sedated on vent Derm:  fungal appearing erythema in folds around pannus, no frank cellulitis; small area of redness on sacrum  IMAGING:  Dg Chest Port 1 View  11/13/2012  *RADIOLOGY REPORT*  Clinical Data: Central line placement.  PORTABLE CHEST - 1 VIEW  Comparison: Chest radiograph performed 11/12/2012  Findings: The patient's endotracheal tube is seen ending 4 cm above the carina.  A left subclavian line is noted ending about the proximal to mid SVC.  The lungs are relatively well expanded.  Vascular congestion is noted, with patchy bilateral airspace opacities, particularly on the left.  This likely reflects persistent pulmonary edema.  A  small left pleural effusion is noted.  No pneumothorax is seen.  The cardiomediastinal silhouette is borderline normal in size.  No acute osseous abnormalities are seen.  IMPRESSION:  1.  Endotracheal tube seen ending 4 cm above the carina. 2.  Left subclavian line noted ending about the proximal to mid SVC. 3.  Vascular congestion, with patchy bilateral airspace opacities, worse on the left.  This likely reflects persistent pulmonary edema.  Small left pleural effusion seen.   Original Report Authenticated By: Tonia Ghent, M.D.    Dg Chest Portable 1 View  11/12/2012  *RADIOLOGY REPORT*  Clinical Data: Endotracheal tube placement.  Respiratory failure.  PORTABLE CHEST - 1 VIEW  Comparison: Chest radiograph performed 11/06/2012  Findings: The patient's endotracheal tube is seen ending 3-4 cm above the carina.  The lungs are slightly hypoexpanded.  Vascular congestion is noted, with bilateral central and left basilar airspace opacification, most compatible with pulmonary edema.  Small bilateral pleural effusions are likely present.  No pneumothorax is seen.  The cardiomediastinal silhouette is borderline normal in size.  No acute osseous abnormalities are identified.  IMPRESSION:  1.  Endotracheal tube seen ending 3-4 cm above the carina. 2.  Lungs slightly hypoexpanded.  Vascular congestion, with bilateral central and left basilar airspace opacification, most compatible with significantly worsening pulmonary edema.  Small bilateral pleural effusions likely present.   Original Report Authenticated By: Tonia Ghent, M.D.    EKG:  1/23 NSR, RBBB, TWI Ant precordial leads  DIAGNOSES: Principal Problem:  *Acute respiratory failure with hypercapnia Active Problems:  Personal history of pulmonary embolism  Acute encephalopathy  Morbid obesity  Obesity hypoventilation syndrome  Septic shock(785.52)  AKI (acute kidney injury)  HCAP (healthcare-associated pneumonia)   ASSESSMENT /  PLAN:  PULMONARY  Lab 11/13/12 0542 11/12/12 2358 11/06/12 1026  PHART 7.511* 7.391 7.358  PCO2ART 41.5 59.2* 70.3*  PO2ART 229.0* 248.0* 86.0  HCO3 33.0* 35.1* 39.5*  TCO2 34.3 31.5 42   A:  1) VDRF secondary to progressive chronic hypercarbic respiratory failure in setting of untreated OSA/OHS - was not using CPAP at nursing facility, so presumably worsening hypercapnea led to her admission/unresponsiveness  2) HCAP? 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  4) Suspected pulmonary hypertension - in setting of uncontrolled OSA due to non-compliance  P:   - Full vent support. - ABG now. - Daily abg/sbt/wua/cxr. - q6h BD. - CPAP mandatory q hs after extubation. - See ID.  CARDIOVASCULAR  Lab 11/13/12 0540 11/12/12 2325  CKTOTAL -- --  CKMB -- --  TROPONINI <0.30 <0.30   A: 1) Shock, unclear etiology: volume depletion? Septic or cardiogenic? No ischemic changes and no clear source of infection Lactic acid normal  Trop neg x2 2) Chronic  coumadin therapy 2/2 PE - INR supratherapeutic on admission  P:  - Hold home Univasc/Metoprolol. - CVP monitoring. - KVO IVF. - Neo as needed for MAP 65. - Treat as septic empirically w/ Vanc and Zosyn.  RENAL  Lab 11/13/12 0540 11/12/12 2325 11/07/12 0415  NA 138 135 144  K 4.8 5.4* 3.5  CL 96 92* 101  CO2 33* 41* 37*  BUN 39* 37* 17  CREATININE 1.65* 2.17* 0.61  GLUCOSE 88 150* 105*   A: AKI, uncertain etiology; presumably pre-renal (FeNa 0.1) due to hypotension/sepsis P:  - Foley - Monitor uop - Renal U/S if renal function deteriorates. - KVO IVF  GASTROINTESTINAL  Lab 11/12/12 2325 11/07/12 0415  AST 43* 47*  ALT 27 26  ALKPHOS 69 73  BILITOT 0.2* 0.4  PROT 7.5 6.6  ALBUMIN 2.5* 2.3*   A: No acute issues. P:  Consult nutrition for TF. Pepcid for PPx.  HEMATOLOGIC  Lab 11/13/12  0540 11/12/12 2325 11/09/12 0645 11/08/12 0715 11/07/12 0415  WBC 8.2 6.7 -- -- 6.0  HGB 11.2* 11.2* -- -- 11.1*  HCT 36.8 37.2 -- -- 35.8*  PLT 168 230 -- -- 175  APTT -- -- -- -- --  INR -- 4.18* 3.20* 2.87* --    A:  1) Chronic coumadin in setting of chronic PE - supratherapeutic on admit.   P:  - hold warfarin - daily PT/INR - start warfarin per pharmacy consult, goal INR 2-3  INFECTIOUS  Lab 11/13/12 0540 11/12/12 2330 11/12/12 2325 11/07/12 0415  WBC 8.2 -- 6.7 6.0  NEUTROABS -- -- 4.7 --  LATICACIDVEN 1.4 1.5 -- --  PROCALCITON 0.14 -- -- --   A:  1) SIRS/ Septic shock? Doesn't meet SIRS criteria but source of shock uncertain, will treat as septic shock for now; HCAP seems most likely given cough and slightly worse LLL infiltrate  P:  - f/u cultures - vanc/zosyn.  ENDOCRINE  Lab 11/13/12 0359 11/09/12 1123 11/09/12 0729 11/08/12 2114 11/08/12 1704  GLUCAP 92 102* 113* 102* 103*   A:  1) Prediabetes - A1c 6.3 P:  - ICU hyperglycemia protocol - Will need diabetes RN to provide education   NEUROLOGIC / PSYCHIATRIC  A:  1) Acute encephalopathy secondary to acute on chronic hypercarbic respiratory failure - followed commands in [facility] ED after intubation 2) Anxiety - has baseline anxiety on home PRN BZD.  P:  - fentanyl and versed as needed for sedation - RASS goal 0  GLOBAL: A:  - Social situation: She went to a SNF but was non-compliant with CPAP.  Apparently she tried to change her HCPOA and change her will to reflect limited life support (no longer than a week), however this was never made official.  Source: her pastor.  Genelle Gather, MD Internal Medicine Resident, PGY I [facility] Internal Medicine Program Pager: [phone] 11/13/2012 10:58 AM    CLINICAL SUMMARY: 67 y/o morbidly obese female with hypercapnic respiratory failure and shock.  Unclear etiology of shock, presumably volume depletion vs sepsis.  Spoke with the pastor, only living  family member is a first cousin who is to arrive today but evidently the patient had met with a lawyer prior to admission and wanted to be a full NCB but papers were never signed.  Will await first cousin and will likely extubate once ready with no intention to reintubate and make DNR.  CC time by me 35 minutes.  Patient seen and examined, agree with above  note.  I dictated the care and orders written for this patient under my direction.  Rush Farmer, MD [phone]

## 2012-11-13 NOTE — Progress Notes (Signed)
Clinical Social Work Department BRIEF PSYCHOSOCIAL ASSESSMENT 11/13/2012  Patient:  Kathleen [name], Kathleen [name]     Account Number:  [id number]     Admit date:  11/12/2012  Clinical Social Worker:  Dennison Bulla  Date/Time:  11/13/2012 09:30 AM  Referred by:  Physician  Date Referred:  11/13/2012 Referred for  SNF Placement   Other Referral:   Interview type:  Other - See comment Other interview type:   Pastor    PSYCHOSOCIAL DATA Living Status:  FACILITY Admitted from facility:  [facility] Level of care:  Skilled Nursing Facility Primary support name:  Jonny Ruiz Primary support relationship to patient:  FRIEND Degree of support available:   Strong    CURRENT CONCERNS Current Concerns  Post-Acute Placement   Other Concerns:    SOCIAL WORK ASSESSMENT / PLAN CSW received referral due to patient being admitted from a facility. CSW reviewed chart and met with patient and pastor at bedside. CSW familiar with patient due to recent dc from the hospital. Patient intubated and unable to participate in assessment at this time.    CSW introduced to myself to pastor who remembered CSW from previous hospitalization. Patient was at [facility] for about 1 day before being admitted back to the hospital. CSW spoke with pastor who reports concerns about patient returning to [facility]. CSW explained that alternative placement could be researched when patient becomes medically stable. [name] reports patient had paperwork drawn up by her attorney to make pastor HCPOA and attorney alternative HCPOA. Patient was to sign paperwork to make it valid today. [name] has already called lawyer regarding patient's status.    CSW completed FL2 and will follow patient. Once patient is extubated, SNF search can be completed. CSW will continue to follow.   Assessment/plan status:  Psychosocial Support/Ongoing Assessment of Needs Other assessment/ plan:   Information/referral to community  resources:   SNF information    PATIENT'S/FAMILY'S RESPONSE TO PLAN OF CARE: Patient unable to participate in assessment. Brewing technologist of CSW visit and thanked CSW for time.

## 2012-11-13 NOTE — Care Management Note (Signed)
    Page 1 of 2   11/18/2012     11:35:25 AM   CARE MANAGEMENT NOTE 11/18/2012  Patient:  Kathleen [name], Kathleen [name]   Account Number:  [id number]  Date Initiated:  11/13/2012  Documentation initiated by:  [facility]  Subjective/Objective Assessment:   Readmission with resp failure - intubated.  From SNF     Action/Plan:   Anticipated DC Date:  11/20/2012   Anticipated DC Plan:  LONG TERM ACUTE CARE (LTAC)  In-house referral  Clinical Social Worker      DC Planning Services  CM consult      Choice offered to / List presented to:             Status of service:  In process, will continue to follow Medicare Important Message given?   (If response is "NO", the following Medicare IM given date fields will be blank) Date Medicare IM given:   Date Additional Medicare IM given:    Discharge Disposition:    Per UR Regulation:  Reviewed for med. necessity/level of care/duration of stay  If discussed at Long Length of Stay Meetings, dates discussed:    Comments:  Contact:  pastor and step grandaughter.                 Lottie Dawson - [phone]  11-18-12 11:30am [name], RNBSN [phone] Patient extubated - cousin and patient both agreeable to [facility] today. Working with laywer from [place] to come and do paperword for [employer], living will.  11-17-12 9am [name],  RNBSN [phone] Talked with patient who is still intubated, but alert and oriented, and her pastor and his wife about discharge, [facility] options.  [name] and wife continued to talk with patient and came back later to state patient would like [facility].  Plan to extubate tomorrow with no reintubation but need to make sure patient understands this prior to extubation.  Wonda Olds, Lupita Leash will be here tomorrow and will discuss these options also.  Lt[facility] consult placed.

## 2012-11-13 NOTE — Progress Notes (Addendum)
ANTIBIOTIC and ANTICOAGULATION CONSULT NOTE - INITIAL  Pharmacy Consult for vancomycin, Zosyn, Coumadin Indication: rule out pneumonia, h/o PE  No Known Allergies  Patient Measurements: Height: 5\' 5"  (165.1 cm) Weight: 356 lb (161.481 kg) IBW/kg (Calculated) : 57   Vital Signs: Temp: 100.1 F (37.8 C) (01/24 0403) Temp src: Oral (01/24 0403) BP: 85/65 mmHg (01/24 0400) Pulse Rate: 64  (01/24 0400) Intake/Output from previous day: 01/23 0701 - 01/24 0700 In: 1500 [I.V.:1500] Out: 10 [Urine:10] Intake/Output from this shift: Total I/O In: 1500 [I.V.:1500] Out: 10 [Urine:10]  Labs:  [facility] 11/12/12 2325  WBC 6.7  HGB 11.2*  PLT 230  LABCREA --  CREATININE 2.17*   Estimated Creatinine Clearance: 38.7 ml/min (by C-G formula based on Cr of 2.17). No results found for this basename: VANCOTROUGH:2,VANCOPEAK:2,VANCORANDOM:2,GENTTROUGH:2,GENTPEAK:2,GENTRANDOM:2,TOBRATROUGH:2,TOBRAPEAK:2,TOBRARND:2,AMIKACINPEAK:2,AMIKACINTROU:2,AMIKACIN:2, in the last 72 hours   Microbiology: Recent Results (from the past 720 hour(s))  MRSA PCR SCREENING     Status: Normal   Collection Time   11/03/12  1:18 AM      Component Value Range Status Comment   MRSA by PCR NEGATIVE  NEGATIVE Final   URINE CULTURE     Status: Normal   Collection Time   11/03/12  1:18 AM      Component Value Range Status Comment   Specimen Description URINE, CATHETERIZED   Final    Special Requests NONE   Final    Culture  Setup Time 11/03/2012 09:24   Final    Colony Count NO GROWTH   Final    Culture NO GROWTH   Final    Report Status 11/04/2012 FINAL   Final   CULTURE, BLOOD (ROUTINE X 2)     Status: Normal   Collection Time   11/03/12  2:00 AM      Component Value Range Status Comment   Specimen Description BLOOD RIGHT ARM   Final    Special Requests     Final    Value: BOTTLES DRAWN AEROBIC AND ANAEROBIC 10CC BLUE,8CC RED   Culture  Setup Time 11/03/2012 09:28   Final    Culture NO GROWTH 5 DAYS   Final     Report Status 11/09/2012 FINAL   Final   CULTURE, BLOOD (ROUTINE X 2)     Status: Normal   Collection Time   11/03/12  2:15 AM      Component Value Range Status Comment   Specimen Description BLOOD RIGHT HAND   Final    Special Requests BOTTLES DRAWN AEROBIC ONLY 8CC   Final    Culture  Setup Time 11/03/2012 09:28   Final    Culture NO GROWTH 5 DAYS   Final    Report Status 11/09/2012 FINAL   Final   CULTURE, RESPIRATORY     Status: Normal   Collection Time   11/04/12 10:20 AM      Component Value Range Status Comment   Specimen Description TRACHEAL ASPIRATE   Final    Special Requests NONE   Final    Gram Stain     Final    Value: ABUNDANT WBC PRESENT,BOTH PMN AND MONONUCLEAR     RARE SQUAMOUS EPITHELIAL CELLS PRESENT     RARE GRAM POSITIVE COCCI IN PAIRS   Culture NO GROWTH 2 DAYS   Final    Report Status 11/06/2012 FINAL   Final     Medical History: Past Medical History  Diagnosis Date  . HTN (hypertension)   . Hyperlipemia   . GERD (gastroesophageal reflux  disease)   . Obesity   . Sleep apnea     she has not been on CPAP  . PE (pulmonary embolism) 05/2011  . DOE (dyspnea on exertion) 01/22/2012  . Hematuria 01/22/2012    Medications:  Scheduled:    . [COMPLETED] sodium chloride   Intravenous Once  . [COMPLETED] sodium chloride   Intravenous Once  . [COMPLETED] sodium chloride   Intravenous Once  . [COMPLETED] acetaminophen  650 mg Rectal Once  . aspirin  324 mg Oral NOW   Or  . aspirin  300 mg Rectal NOW  . famotidine (PEPCID) IV  20 mg Intravenous Q12H  . heparin  5,000 Units Subcutaneous Q8H  . [COMPLETED] LORazepam  2 mg Intravenous Once  . [COMPLETED] LORazepam  2 mg Intravenous Once  . [COMPLETED] LORazepam  2 mg Intravenous Once  . [COMPLETED] sodium chloride  1,000 mL Intravenous STAT   Assessment: 67 yo female admitted with acute on chronic respiratory failure and was intubated in the ED. Pharmacy to manage Zosyn and vancomycin for r/o pneumonia.  Pharmacy also consulted to manage Coumadin for h/o PE. INR on admit is 4.18.   Goal of Therapy:  Vancomycin trough level 15-20 mcg/ml INR 2-3  Plan:  1. Zosyn 3.375gm IV Q8H (4 hr infusion) 2. Vancomycin 2.5gm IV x 1, then 1.5gm IV Q24H.  3. No Coumadin for today. 4. Daily PT / INR  Emeline Gins 11/13/2012,4:41 AM   Addendum: Vancomycin maintenance dose changed to 2g IV q24h starting 1/25 based on weight and renal function.   Thank you, Franchot Erichsen, Pharm.D. Clinical Pharmacist   Pager: [phone] 11/13/2012 10:53 AM

## 2012-11-14 ENCOUNTER — Inpatient Hospital Stay (HOSPITAL_COMMUNITY): Payer: Medicare Other

## 2012-11-14 LAB — URINE CULTURE
Colony Count: NO GROWTH
Culture: NO GROWTH

## 2012-11-14 LAB — CBC
HCT: 39.2 % (ref 36.0–46.0)
MCH: 29.8 pg (ref 26.0–34.0)
MCHC: 32.4 g/dL (ref 30.0–36.0)
MCV: 92 fL (ref 78.0–100.0)
RDW: 15.7 % — ABNORMAL HIGH (ref 11.5–15.5)
WBC: 9.4 10*3/uL (ref 4.0–10.5)

## 2012-11-14 LAB — GLUCOSE, CAPILLARY
Glucose-Capillary: 135 mg/dL — ABNORMAL HIGH (ref 70–99)
Glucose-Capillary: 131 mg/dL — ABNORMAL HIGH (ref 70–99)
Glucose-Capillary: 148 mg/dL — ABNORMAL HIGH (ref 70–99)
Glucose-Capillary: 138 mg/dL — ABNORMAL HIGH (ref 70–99)

## 2012-11-14 LAB — PHOSPHORUS: Phosphorus: 2.9 mg/dL (ref 2.3–4.6)

## 2012-11-14 LAB — BASIC METABOLIC PANEL
BUN: 42 mg/dL — ABNORMAL HIGH (ref 6–23)
CO2: 39 mEq/L — ABNORMAL HIGH (ref 19–32)
Chloride: 94 mEq/L — ABNORMAL LOW (ref 96–112)
Creatinine, Ser: 1.03 mg/dL (ref 0.50–1.10)
Glucose, Bld: 141 mg/dL — ABNORMAL HIGH (ref 70–99)

## 2012-11-14 MED ORDER — FAMOTIDINE 40 MG/5ML PO SUSR
20.0000 mg | Freq: Two times a day (BID) | ORAL | Status: DC
  Filled 2012-11-14: qty 2.5

## 2012-11-14 MED ORDER — ACETAZOLAMIDE SODIUM 500 MG IJ SOLR
250.0000 mg | Freq: Three times a day (TID) | INTRAMUSCULAR | Status: AC
  Administered 2012-11-14: 250 mg via INTRAVENOUS
  Filled 2012-11-14 (×2): qty 500

## 2012-11-14 MED ORDER — RANITIDINE HCL 150 MG/10ML PO SYRP
150.0000 mg | ORAL_SOLUTION | Freq: Two times a day (BID) | ORAL | Status: DC
  Administered 2012-11-14 – 2012-11-18 (×8): 150 mg
  Filled 2012-11-14 (×9): qty 10

## 2012-11-14 MED ORDER — VANCOMYCIN HCL 10 G IV SOLR
1250.0000 mg | Freq: Two times a day (BID) | INTRAVENOUS | Status: DC
  Administered 2012-11-15 – 2012-11-18 (×7): 1250 mg via INTRAVENOUS
  Filled 2012-11-14 (×7): qty 1250

## 2012-11-14 MED ORDER — FUROSEMIDE 10 MG/ML IJ SOLN
40.0000 mg | Freq: Four times a day (QID) | INTRAMUSCULAR | Status: DC
  Administered 2012-11-14 (×2): 40 mg via INTRAVENOUS
  Filled 2012-11-14 (×2): qty 4

## 2012-11-14 MED ORDER — MAGNESIUM SULFATE 40 MG/ML IJ SOLN
2.0000 g | Freq: Once | INTRAMUSCULAR | Status: AC
  Administered 2012-11-14: 2 g via INTRAVENOUS
  Filled 2012-11-14: qty 50

## 2012-11-14 MED ORDER — POTASSIUM CHLORIDE 20 MEQ/15ML (10%) PO LIQD
40.0000 meq | Freq: Three times a day (TID) | ORAL | Status: AC
  Administered 2012-11-14 (×3): 40 meq
  Filled 2012-11-14 (×3): qty 30

## 2012-11-14 MED ORDER — METOLAZONE 5 MG PO TABS
5.0000 mg | ORAL_TABLET | Freq: Every day | ORAL | Status: AC
  Administered 2012-11-14: 5 mg via ORAL
  Filled 2012-11-14: qty 1

## 2012-11-14 MED ORDER — WARFARIN - PHARMACIST DOSING INPATIENT: Freq: Every day | Status: DC

## 2012-11-14 NOTE — Progress Notes (Signed)
ANTICOAGULATION & ANTIBIOTIC CONSULT NOTE - Follow Up Consult  Pharmacy Consult for Warfarin + Vanc/Zosyn Indication: Hx PE and r/o PNA  No Known Allergies  Patient Measurements: Height: 5\' 5"  (165.1 cm) Weight: 295 lb 13.7 oz (134.2 kg) IBW/kg (Calculated) : 57   Vital Signs: Temp: 98.7 F (37.1 C) (01/25 1202) Temp src: Oral (01/25 1202) BP: 99/61 mmHg (01/25 1200) Pulse Rate: 71  (01/25 1200)  Labs:  Basename 11/14/12 0420 11/13/12 0540 11/12/12 2325  HGB 12.7 11.2* --  HCT 39.2 36.8 37.2  PLT 202 168 230  APTT -- -- --  LABPROT 38.0* -- 37.8*  INR 4.21* -- 4.18*  HEPARINUNFRC -- -- --  CREATININE 1.03 1.65* 2.17*  CKTOTAL -- -- --  CKMB -- -- --  TROPONINI -- <0.30 <0.30    Estimated Creatinine Clearance: 72.5 ml/min (by C-G formula based on Cr of 1.03).   Assessment: 67 y.o. F who presented to the [facility] on 1/23 with with acute on chronic respiratory failure requiring intubation. The patient was started on empiric PNA coverage with Vancomycin + Zosyn at that time. The patient's renal function has noted to improve significantly today requiring a Vancomycin dose adjustment. SCr 1.03 << 1.65, CrCl~60-65 ml/min (normalized).   The patient was chronic warfarin PTA for hx PE and was noted to have an elevated INR on admit of 4.18. INR remains SUPRAtherapeutic this morning despite holding warfarin doses (INR 4.21 << 4.18, goal of 2-3). Will continue to hold warfarin doses  Given the patient's improving renal function and start of tube feeds -- will go ahead and adjust pepcid to PT and increase dose to bid.  Goal of Therapy:  INR 2-3 Vancomycin trough of 15-20 mcg/ml   Plan:  1. Adjust Vancomycin dose to 1250 mg IV every 12 hours (next dose due on 1/26 a.m) 2. Continue Zosyn 3.375 g IV every 8 hours (infused over 4 hours) 3. Hold warfarin dose today 4. D/c IV Pepcid and change to Pepcid 20 mg PT bid 5. Will continue to follow renal function, culture results, LOT, and  antibiotic de-escalation plans  6. Will continue to monitor for any signs/symptoms of bleeding and will follow up with PT/INR in the a.m.   Georgina Pillion, PharmD, BCPS Clinical Pharmacist Pager: [phone] 11/14/2012 1:58 PM

## 2012-11-14 NOTE — Progress Notes (Signed)
[facility] ADULT ICU REPLACEMENT PROTOCOL FOR AM LAB REPLACEMENT ONLY  The patient does not apply for the [facility] Adult ICU Electrolyte Replacment Protocol based on the criteria listed below:   1. Is GFR >/= 50 ml/min? yes  Patient's GFR today is 55  2. Is urine output >/= 0.5 ml/kg/hr for the last 8 hours? yes Patient's UOP is  ml/kg/hr 3. Is BUN < 30 mg/dL? yes  Patient's BUN today is 27 4. Abnormal electrolyte(s): Mag 1.3 5. Ordered repletion with: replaced by MD 6. If a panic level lab has been reported, has the CCM MD in charge been notified? no.   Physician:    Markus Daft A 11/14/2012 5:36 AM

## 2012-11-14 NOTE — Progress Notes (Signed)
eLink Physician-Brief Progress Note Patient Name: Kathleen [name] DOB: July 05, 1946 MRN: [id number]  Date of Service  11/14/2012   HPI/Events of Note   cvp3 Diuresed 5L  eICU Interventions  Hold lasix   Intervention Category Intermediate Interventions: Medication change / dose adjustment  ALVA,RAKESH V. 11/14/2012, 5:21 PM

## 2012-11-14 NOTE — Progress Notes (Signed)
PULMONARY  / CRITICAL CARE MEDICINE   Name: GENE COLEE MRN: [id number] DOB: 07-04-1946    LOS: 2  REFERRING MD : [facility]  CHIEF COMPLAINT:  Hypercapnic respiratory failure, Shock  BRIEF PATIENT DESCRIPTION:  67 yo with history of PE (therapeutic INR on Coumadin), OSA (noncompliant with BiPAP), on home oxygen and chronic opioids brought to ED 1/24 after developing respiratory failure, confusion, and hypotension at home.  She was recently discharged from [facility] after hypercapnic respiratory failure and cellulitis in the setting of a fall.   HPI: See brief patient descripton.  Additionally, it was noted by her pastor (only contact available) that she was becoming more somnolent and coughing more at her nursing facility the day prior to hospital admission (1/23).  As the night wore on she became unresponsive and was brought to [facility] ED where she was intubated without sedation.  He was unaware of any fever or chills.  However, he notes that she refused to wear her CPAP while at the nursing facility.  He reports that she had been trying to change her HCPOA to him, but she never signed the paperwork.  He contacted her next of kin (a step-granddaughter), who is on the way to the hospital. Per the pastor, he does not believe that Mrs. Bleecker wanted CPR, defibrillation, and would not like to be on the ventilator for an extended period. He says that she told him she definitely did not want a PEG; however, he is not legally her POA.  LINES / TUBES: Foley 1/24 >>> Left Subclavian 1/24 >>>  CULTURES: 1/24 - Blood culture x 2 >> 1/24 - Urine culture >>  1/24 - Respiratory culture >>   ANTIBIOTICS: Vancomycin 1/24 >> Zosyn 1/24 >>  SIGNIFICANT EVENTS:  1/13 - Admitted for cellulitis, transferred to ICU for hypercarbic respiratory failure 1/14 - Remains intubated, hypercarbia improving. SW consulted to help start search for SNF. 1/17 - Extubated. 1/20 - discharged to SNF =========== 1/24 Admitted from  SNF >> [facility] >> [facility], intubated   SUBJECTIVE/ INTERVAL HISTORY:   VITAL SIGNS: Temp:  [97.9 F (36.6 C)-99.4 F (37.4 C)] 98.4 F (36.9 C) (01/25 0804) Pulse Rate:  [60-80] 67  (01/25 0800) Resp:  [13-26] 26  (01/25 0800) BP: (78-119)/(40-68) 119/56 mmHg (01/25 0800) SpO2:  [87 %-99 %] 96 % (01/25 0800) FiO2 (%):  [0.3 %-40 %] 40 % (01/25 0800) Weight:  [134.2 kg (295 lb 13.7 oz)] 134.2 kg (295 lb 13.7 oz) (01/25 0355) HEMODYNAMICS: CVP:  [11 mmHg-14 mmHg] 11 mmHg VENTILATOR SETTINGS: Vent Mode:  [-] CPAP;PSV FiO2 (%):  [0.3 %-40 %] 40 % Set Rate:  [18 bmp] 18 bmp Vt Set:  [550 mL] 550 mL PEEP:  [5 cmH20] 5 cmH20 Pressure Support:  [14 cmH20] 14 cmH20 Plateau Pressure:  [12 cmH20-28 cmH20] 22 cmH20  INTAKE / OUTPUT: Intake/Output      01/24 0701 - 01/25 0700 01/25 0701 - 01/26 0700   I.V. (mL/kg) 826.2 (6.2) 41.3 (0.3)   NG/GT 290 60   IV Piggyback 800 37.5   Total Intake(mL/kg) 1916.2 (14.3) 138.8 (1)   Urine (mL/kg/hr) 8525 (2.6) 1450   Total Output 8525 1450   Net -6608.8 -1311.2         PHYSICAL EXAMINATION: Gen: morbidly obese, chronically ill appearing HEENT: NCAT, ETT in place PULM: Breath sounds diminished CV: Distant, barely audible AB: BS+, soft, nontender Ext: Lower leg edema, feet cold, DP pulse present with doppler, non-palpable Neuro: Sedated on  vent Derm: fungal appearing erythema in folds around pannus, no frank cellulitis; small area of redness on sacrum  IMAGING:  Portable Chest Xray In Am  11/14/2012  *RADIOLOGY REPORT*  Clinical Data: Ventilator dependent respiratory failure.  PORTABLE CHEST - 1 VIEW 11/14/2012 0621 hours:  Comparison: Portable chest x-ray yesterday dating back to 11/05/2012.  Findings: Endotracheal tube tip in satisfactory position projecting approximately 3 cm above the carina.  Left subclavian central venous catheter tip projects over the left innominate vein and has withdrawn slightly.  Cardiac silhouette enlarged but stable.  Interval worsening of now moderate diffuse interstitial and airspace pulmonary edema.  Enlarging bilateral pleural effusions with associated passive atelectasis in the lower lobes, left greater than right.  Nasogastric tube courses below the diaphragm into the stomach.  IMPRESSION: Support apparatus satisfactory.  Interval worsening of now moderate interstitial and airspace pulmonary edema and bilateral pleural effusions since yesterday.  Associated passive atelectasis in the lower lobes, left greater than right.   Original Report Authenticated By: Hulan Saas, M.D.    Dg Chest Port 1 View  11/13/2012  *RADIOLOGY REPORT*  Clinical Data: Central line placement.  PORTABLE CHEST - 1 VIEW  Comparison: Chest radiograph performed 11/12/2012  Findings: The patient's endotracheal tube is seen ending 4 cm above the carina.  A left subclavian line is noted ending about the proximal to mid SVC.  The lungs are relatively well expanded.  Vascular congestion is noted, with patchy bilateral airspace opacities, particularly on the left.  This likely reflects persistent pulmonary edema.  A small left pleural effusion is noted.  No pneumothorax is seen.  The cardiomediastinal silhouette is borderline normal in size.  No acute osseous abnormalities are seen.  IMPRESSION:  1.  Endotracheal tube seen ending 4 cm above the carina. 2.  Left subclavian line noted ending about the proximal to mid SVC. 3.  Vascular congestion, with patchy bilateral airspace opacities, worse on the left.  This likely reflects persistent pulmonary edema.  Small left pleural effusion seen.   Original Report Authenticated By: Tonia Ghent, M.D.    Dg Chest Portable 1 View  11/12/2012  *RADIOLOGY REPORT*  Clinical Data: Endotracheal tube placement.  Respiratory failure.  PORTABLE CHEST - 1 VIEW  Comparison: Chest radiograph performed 11/06/2012  Findings: The patient's endotracheal tube is seen ending 3-4 cm above the carina.  The lungs are slightly  hypoexpanded.  Vascular congestion is noted, with bilateral central and left basilar airspace opacification, most compatible with pulmonary edema.  Small bilateral pleural effusions are likely present.  No pneumothorax is seen.  The cardiomediastinal silhouette is borderline normal in size.  No acute osseous abnormalities are identified.  IMPRESSION:  1.  Endotracheal tube seen ending 3-4 cm above the carina. 2.  Lungs slightly hypoexpanded.  Vascular congestion, with bilateral central and left basilar airspace opacification, most compatible with significantly worsening pulmonary edema.  Small bilateral pleural effusions likely present.   Original Report Authenticated By: Tonia Ghent, M.D.    EKG:  1/23 NSR, RBBB, TWI Ant precordial leads  DIAGNOSES: Principal Problem:  *Acute respiratory failure with hypercapnia Active Problems:  Personal history of pulmonary embolism  Acute encephalopathy  Morbid obesity  Obesity hypoventilation syndrome  Septic shock(785.52)  AKI (acute kidney injury)  HCAP (healthcare-associated pneumonia)   ASSESSMENT / PLAN:  PULMONARY  Lab 11/13/12 0542 11/12/12 2358  PHART 7.511* 7.391  PCO2ART 41.5 59.2*  PO2ART 229.0* 248.0*  HCO3 33.0* 35.1*  TCO2 34.3 31.5   A:  1)  VDRF secondary to progressive chronic hypercarbic respiratory failure in setting of untreated OSA/OHS - was not using CPAP at nursing facility, so presumably worsening hypercapnea led to her admission/unresponsiveness  2) HCAP? 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  4) Suspected pulmonary hypertension - in setting of uncontrolled OSA due to non-compliance  P:   - Begin PS trails today. - ABG now and in AM. - Daily abg/sbt/wua/cxr. - q6h BD. - CPAP mandatory q hs after extubation. - See ID. - Diureses today.  CARDIOVASCULAR  Lab 11/13/12 0540  11/12/12 2325  CKTOTAL -- --  CKMB -- --  TROPONINI <0.30 <0.30   A: 1) Shock, unclear etiology: volume depletion? Septic or cardiogenic? No ischemic changes and no clear source of infection Lactic acid normal  Trop neg x2 2) Chronic coumadin therapy 2/2 PE - INR supratherapeutic on admission  P:  - Hold home Univasc/Metoprolol. - CVP monitoring. - KVO IVF. - Neo as needed for MAP 65. - Treat as septic empirically w/ Vanc and Zosyn.  RENAL  Lab 11/14/12 0420 11/13/12 0540 11/12/12 2325  NA 142 138 135  K 3.8 4.8 5.4*  CL 94* 96 92*  CO2 39* 33* 41*  BUN 42* 39* 37*  CREATININE 1.03 1.65* 2.17*  GLUCOSE 141* 88 150*   A: AKI, uncertain etiology; presumably pre-renal (FeNa 0.1) due to hypotension/sepsis P:  - Foley - Monitor uop - Renal U/S if renal function deteriorates. Margaretha Glassing IVF - Additional lasix today with zaroxolyn and acetazolamide.  GASTROINTESTINAL  Lab 11/12/12 2325  AST 43*  ALT 27  ALKPHOS 69  BILITOT 0.2*  PROT 7.5  ALBUMIN 2.5*   A: No acute issues. P:  Consult nutrition for TF. Pepcid for PPx.  HEMATOLOGIC  Lab 11/14/12 0420 11/13/12 0540 11/12/12 2325 11/09/12 0645  WBC 9.4 8.2 6.7 --  HGB 12.7 11.2* 11.2* --  HCT 39.2 36.8 37.2 --  PLT 202 168 230 --  APTT -- -- -- --  INR 4.21* -- 4.18* 3.20*    A:  1) Chronic coumadin in setting of chronic PE - supratherapeutic on admit.   P:  - Hold warfarin - Daily PT/INR - Continue warfarin per pharmacy consult, goal INR 2-3  INFECTIOUS  Lab 11/14/12 0420 11/13/12 0540 11/12/12 2330 11/12/12 2325  WBC 9.4 8.2 -- 6.7  NEUTROABS -- -- -- 4.7  LATICACIDVEN -- 1.4 1.5 --  PROCALCITON -- 0.14 -- --   A:  1) SIRS/ Septic shock? Doesn't meet SIRS criteria but source of shock uncertain, will treat as septic shock for now; HCAP seems most likely given cough and slightly worse LLL infiltrate  P:  - f/u cultures - vanc/zosyn.  ENDOCRINE  Lab 11/14/12 0757 11/14/12 0344 11/14/12 0032  11/13/12 2019 11/13/12 1600  GLUCAP 135* 130* 119* 100* 105*   A:  1) Prediabetes - A1c 6.3 P:  - ICU hyperglycemia protocol - Will need diabetes RN to provide education   NEUROLOGIC / PSYCHIATRIC  A:  1) Acute encephalopathy secondary to acute on chronic hypercarbic respiratory failure - followed commands in [facility] ED after intubation 2) Anxiety - has baseline anxiety on home PRN BZD.  P:  - fentanyl and versed as needed for sedation - RASS goal 0  GLOBAL: A:  - Social situation: She went to a SNF but was non-compliant with CPAP.  Apparently  she tried to change her HCPOA and change her will to reflect limited life support (no longer than a week), however this was never made official.  CLINICAL SUMMARY: 67 y/o morbidly obese female with hypercapnic respiratory failure and shock.  Unclear etiology of shock, presumably volume depletion vs sepsis.  Spoke with the pastor, only living family member is a first cousin who is to arrive today but evidently the patient had met with a lawyer prior to admission and wanted to be a full NCB but papers were never signed.  Will await first cousin and will likely extubate once ready with no intention to reintubate and make DNR.  CC time by me 35 minutes.  Patient seen and examined, agree with above note.  I dictated the care and orders written for this patient under my direction.  Alyson Reedy, M.D. [facility] Pulmonary/Critical Care Medicine. Pager: [phone]. After hours pager: [phone].

## 2012-11-14 NOTE — Progress Notes (Signed)
CRITICAL CARE RESIDENT NOTE Interim Progress Note     RESULT INTERVENTION TAKEN  1. Mag 1.3  Mag 2 g  2.    3.      Signed: Annett Gula, MD  PGY-1, Internal Medicine Resident Pager: [phone] (7PM-7AM) 11/14/2012, 5:17 AM

## 2012-11-15 ENCOUNTER — Inpatient Hospital Stay (HOSPITAL_COMMUNITY): Payer: Medicare Other

## 2012-11-15 DIAGNOSIS — L0390 Cellulitis, unspecified: Secondary | ICD-10-CM

## 2012-11-15 LAB — BASIC METABOLIC PANEL
CO2: 39 mEq/L — ABNORMAL HIGH (ref 19–32)
Calcium: 9.2 mg/dL (ref 8.4–10.5)
Creatinine, Ser: 1.11 mg/dL — ABNORMAL HIGH (ref 0.50–1.10)
Glucose, Bld: 168 mg/dL — ABNORMAL HIGH (ref 70–99)

## 2012-11-15 LAB — PROTIME-INR: INR: 3.06 — ABNORMAL HIGH (ref 0.00–1.49)

## 2012-11-15 LAB — CBC
MCH: 31.8 pg (ref 26.0–34.0)
MCHC: 34.2 g/dL (ref 30.0–36.0)
MCV: 93 fL (ref 78.0–100.0)
Platelets: 253 10*3/uL (ref 150–400)
RDW: 16.3 % — ABNORMAL HIGH (ref 11.5–15.5)

## 2012-11-15 LAB — CULTURE, RESPIRATORY W GRAM STAIN

## 2012-11-15 LAB — GLUCOSE, CAPILLARY: Glucose-Capillary: 148 mg/dL — ABNORMAL HIGH (ref 70–99)

## 2012-11-15 MED ORDER — DEXMEDETOMIDINE HCL IN NACL 200 MCG/50ML IV SOLN
0.4000 ug/kg/h | INTRAVENOUS | Status: DC
Start: 2012-11-15 — End: 2012-11-16
  Administered 2012-11-15 – 2012-11-16 (×6): 0.4 ug/kg/h via INTRAVENOUS
  Filled 2012-11-15 (×6): qty 50

## 2012-11-15 MED ORDER — FUROSEMIDE 10 MG/ML IJ SOLN
INTRAMUSCULAR | Status: AC
  Filled 2012-11-15: qty 2

## 2012-11-15 MED ORDER — MAGNESIUM SULFATE 40 MG/ML IJ SOLN
2.0000 g | Freq: Once | INTRAMUSCULAR | Status: AC
  Administered 2012-11-15: 2 g via INTRAVENOUS
  Filled 2012-11-15: qty 50

## 2012-11-15 MED ORDER — FUROSEMIDE 10 MG/ML IJ SOLN
20.0000 mg | Freq: Four times a day (QID) | INTRAMUSCULAR | Status: AC
  Administered 2012-11-15 – 2012-11-16 (×3): 20 mg via INTRAVENOUS
  Filled 2012-11-15 (×2): qty 2

## 2012-11-15 MED ORDER — POTASSIUM CHLORIDE 20 MEQ/15ML (10%) PO LIQD
40.0000 meq | Freq: Three times a day (TID) | ORAL | Status: AC
  Administered 2012-11-15 (×2): 40 meq
  Filled 2012-11-15 (×2): qty 30

## 2012-11-15 MED ORDER — DOCUSATE SODIUM 50 MG/5ML PO LIQD
100.0000 mg | Freq: Two times a day (BID) | ORAL | Status: DC
  Administered 2012-11-15 – 2012-11-17 (×6): 100 mg via ORAL
  Filled 2012-11-15 (×9): qty 10

## 2012-11-15 NOTE — Progress Notes (Signed)
ANTICOAGULATION CONSULT NOTE - Follow Up Consult  Pharmacy Consult for Warfarin Indication: Hx PE  No Known Allergies  Patient Measurements: Height: 5\' 5"  (165.1 cm) Weight: 270 lb 4.5 oz (122.6 kg) IBW/kg (Calculated) : 57   Vital Signs: Temp: 98.2 F (36.8 C) (01/26 0810) Temp src: Oral (01/26 0810) BP: 105/61 mmHg (01/26 1500) Pulse Rate: 82  (01/26 1500)  Labs:  Basename 11/15/12 0419 11/14/12 0420 11/13/12 0540 11/12/12 2325  HGB 14.5 12.7 -- --  HCT 42.4 39.2 36.8 --  PLT 253 202 168 --  APTT -- -- -- --  LABPROT 30.0* 38.0* -- 37.8*  INR 3.06* 4.21* -- 4.18*  HEPARINUNFRC -- -- -- --  CREATININE 1.11* 1.03 1.65* --  CKTOTAL -- -- -- --  CKMB -- -- -- --  TROPONINI -- -- <0.30 <0.30    Estimated Creatinine Clearance: 63.7 ml/min (by C-G formula based on Cr of 1.11).   Assessment: 67 y.o. F on chronic warfarin PTA for hx PE and was noted to have an elevated INR on admit of 4.18. INR remains slightly SUPRAtherapeutic this morning despite holding warfarin doses, though trending down significantly (INR 3.06 << 4.21, goal of 2-3). Will plan to resume warfarin today.  Goal of Therapy:  INR 2-3   Plan:  1. Warfarin 5 mg x 1 dose at 1800 today 2. Will continue to monitor for any signs/symptoms of bleeding and will follow up with PT/INR in the a.m.   Georgina Pillion, PharmD, BCPS Clinical Pharmacist Pager: [phone] 11/15/2012 3:46 PM

## 2012-11-15 NOTE — Progress Notes (Addendum)
Previous note written in error.

## 2012-11-15 NOTE — Progress Notes (Signed)
PULMONARY  / CRITICAL CARE MEDICINE   Name: Kathleen [name] MRN: [id number] DOB: 09-25-1946    LOS: 3  REFERRING MD : [facility]  CHIEF COMPLAINT:  Hypercapnic respiratory failure, Shock  BRIEF PATIENT DESCRIPTION:  67 yo with history of PE (therapeutic INR on Coumadin), OSA (noncompliant with BiPAP), on home oxygen and chronic opioids brought to ED 1/24 after developing respiratory failure, confusion, and hypotension at home.  She was recently discharged from [facility] after hypercapnic respiratory failure and cellulitis in the setting of a fall.   HPI: See brief patient descripton.  Additionally, it was noted by her pastor (only contact available) that she was becoming more somnolent and coughing more at her nursing facility the day prior to hospital admission (1/23).  As the night wore on she became unresponsive and was brought to [facility] ED where she was intubated without sedation.  He was unaware of any fever or chills.  However, he notes that she refused to wear her CPAP while at the nursing facility.  He reports that she had been trying to change her HCPOA to him, but she never signed the paperwork.  He contacted her next of kin (a step-granddaughter), who is on the way to the hospital. Per the pastor, he does not believe that Kathleen [name] wanted CPR, defibrillation, and would not like to be on the ventilator for an extended period. He says that she told him she definitely did not want a PEG; however, he is not legally her POA.  LINES / TUBES: Foley 1/24 >>> Left Subclavian 1/24 >>>  CULTURES: 1/24 - Blood culture x 2 >> 1/24 - Urine culture >> Neg 1/24 - Respiratory culture >> Rare GPC in pairs  ANTIBIOTICS: Vancomycin 1/24 >> Zosyn 1/24 >>  SIGNIFICANT EVENTS:  1/13 - Admitted for cellulitis, transferred to ICU for hypercarbic respiratory failure 1/14 - Remains intubated, hypercarbia improving. SW consulted to help start search for SNF. 1/17 - Extubated. 1/20 - discharged to  SNF =========== 1/24 Admitted from SNF >> [facility] >> [facility], intubated   SUBJECTIVE/ INTERVAL HISTORY: 1/26 - Pt anxieous overnight, wants ETT out  VITAL SIGNS: Temp:  [98.4 F (36.9 C)-100.3 F (37.9 C)] 99.4 F (37.4 C) (01/26 0415) Pulse Rate:  [66-92] 73  (01/26 0600) Resp:  [17-26] 18  (01/26 0600) BP: (74-126)/(45-80) 103/50 mmHg (01/26 0600) SpO2:  [91 %-97 %] 97 % (01/26 0600) FiO2 (%):  [40 %] 40 % (01/26 0359) Weight:  [270 lb 4.5 oz (122.6 kg)] 270 lb 4.5 oz (122.6 kg) (01/26 0415) HEMODYNAMICS: CVP:  [3 mmHg-9 mmHg] 3 mmHg VENTILATOR SETTINGS: Vent Mode:  [-] PRVC FiO2 (%):  [40 %] 40 % Set Rate:  [18 bmp] 18 bmp Vt Set:  [550 mL] 550 mL PEEP:  [5 cmH20] 5 cmH20 Pressure Support:  [14 cmH20] 14 cmH20 Plateau Pressure:  [22 cmH20-25 cmH20] 24 cmH20  INTAKE / OUTPUT: Intake/Output      01/25 0701 - 01/26 0700 01/26 0701 - 01/27 0700   I.V. (mL/kg) 772 (6.3)    NG/GT 460    IV Piggyback 412.5    Total Intake(mL/kg) 1644.5 (13.4)    Urine (mL/kg/hr) 8020 (2.7)    Total Output 8020    Net -6375.5          PHYSICAL EXAMINATION: Gen: morbidly obese, chronically ill appearing HEENT: NCAT, ETT in place PULM: Breath sounds diminished at bases CV: Distant, barely audible AB: Obese, BS+, soft, nontender Ext: Lower leg edema, feet cold,  DP pulse present with doppler, non-palpable Neuro: Spontaneously opens eyes, follows commands Derm: Fungal appearing erythema in folds around pannus, no frank cellulitis; small area of redness on sacrum  IMAGING:  Portable Chest Xray In Am  11/14/2012  *RADIOLOGY REPORT*  Clinical Data: Ventilator dependent respiratory failure.  PORTABLE CHEST - 1 VIEW 11/14/2012 0621 hours:  Comparison: Portable chest x-ray yesterday dating back to 11/05/2012.  Findings: Endotracheal tube tip in satisfactory position projecting approximately 3 cm above the carina.  Left subclavian central venous catheter tip projects over the left innominate vein and  has withdrawn slightly.  Cardiac silhouette enlarged but stable. Interval worsening of now moderate diffuse interstitial and airspace pulmonary edema.  Enlarging bilateral pleural effusions with associated passive atelectasis in the lower lobes, left greater than right.  Nasogastric tube courses below the diaphragm into the stomach.  IMPRESSION: Support apparatus satisfactory.  Interval worsening of now moderate interstitial and airspace pulmonary edema and bilateral pleural effusions since yesterday.  Associated passive atelectasis in the lower lobes, left greater than right.   Original Report Authenticated By: Hulan Saas, M.D.    EKG:  1/23 NSR, RBBB, TWI Ant precordial leads  DIAGNOSES: Principal Problem:  *Acute respiratory failure with hypercapnia Active Problems:  Personal history of pulmonary embolism  Acute encephalopathy  Morbid obesity  Obesity hypoventilation syndrome  Septic shock(785.52)  AKI (acute kidney injury)  HCAP (healthcare-associated pneumonia)   ASSESSMENT / PLAN:  PULMONARY  Lab 11/15/12 0425 11/13/12 0542 11/12/12 2358  PHART 7.479* 7.511* 7.391  PCO2ART 51.4* 41.5 59.2*  PO2ART 88.8 229.0* 248.0*  HCO3 37.4* 33.0* 35.1*  TCO2 38.9 34.3 31.5   A:  1) VDRF secondary to progressive chronic hypercarbic respiratory failure in setting of untreated OSA/OHS - was not using CPAP at nursing facility, so presumably worsening hypercapnea led to her admission/unresponsiveness  2) HCAP? 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  4) Suspected pulmonary hypertension - in setting of uncontrolled OSA due to non-compliance, diuresed, -11L this admission  P:   - Begin PS trails with SBT in AM. - Daily abg/sbt/wua/cxr. - q6h BD. - CPAP mandatory qhs after extubation. - See ID. - Once extubated no intention to  re-intubate.  CARDIOVASCULAR  Lab 11/13/12 0540 11/12/12 2325  CKTOTAL -- --  CKMB -- --  TROPONINI <0.30 <0.30   A: 1) Shock, unclear etiology: volume depletion? Septic or cardiogenic? No ischemic changes and no clear source of infection Still requiring pressors, holding Lasix Lactic acid normal  Trop neg x2 2) Chronic coumadin therapy 2/2 PE - INR supratherapeutic on admission  P:  - Hold home Univasc/Metoprolol. - CVP monitoring. - KVO IVF. - Levo as needed for MAP 65. - Treat as septic empirically w/ Vanc and Zosyn.  RENAL  Lab 11/15/12 0419 11/14/12 0420 11/13/12 0540  NA 142 142 138  K 3.7 3.8 4.8  CL 93* 94* 96  CO2 39* 39* 33*  BUN 58* 42* 39*  CREATININE 1.11* 1.03 1.65*  GLUCOSE 168* 141* 88   A:  AKI, uncertain etiology; presumably pre-renal (FeNa 0.1) due to hypotension/sepsis Hypomag P:  - Replacing electrolytes - Foley - Monitor uop - Renal U/S if renal function deteriorates. - KVO IVF  GASTROINTESTINAL  Lab 11/12/12 2325  AST 43*  ALT 27  ALKPHOS 69  BILITOT 0.2*  PROT 7.5  ALBUMIN 2.5*   A:  No acute issues. P:  Consult nutrition for TF. Zantac for PPx.  HEMATOLOGIC  Lab 11/15/12 0419 11/14/12 0420 11/13/12 0540 11/12/12 2325  WBC 10.0 9.4 8.2 --  HGB 14.5 12.7 11.2* --  HCT 42.4 39.2 36.8 --  PLT 253 202 168 --  APTT -- -- -- --  INR 3.06* 4.21* -- 4.18*    A:  1) Chronic coumadin in setting of chronic PE - supratherapeutic on admit.   P:  - Hold warfarin - Daily PT/INR - Continue warfarin per pharmacy consult, goal INR 2-3  INFECTIOUS  Lab 11/15/12 0419 11/14/12 0420 11/13/12 0540 11/12/12 2330 11/12/12 2325  WBC 10.0 9.4 8.2 -- --  NEUTROABS -- -- -- -- 4.7  LATICACIDVEN -- -- 1.4 1.5 --  PROCALCITON -- -- 0.14 -- --   A:  1) SIRS/ Septic shock? Doesn't meet SIRS criteria but source of shock uncertain, will treat as septic shock for now; HCAP seems most likely given cough and slightly worse LLL infiltrate  P:  -  f/u cultures - vanc/zosyn.  ENDOCRINE  Lab 11/15/12 0436 11/15/12 0036 11/14/12 2001 11/14/12 1605 11/14/12 1144  GLUCAP 156* 131* 138* 148* 131*   A:  1) Prediabetes - A1c 6.3 P:  - ICU hyperglycemia protocol - Will need diabetes RN to provide education  NEUROLOGIC / PSYCHIATRIC  A:  1) Acute encephalopathy secondary to acute on chronic hypercarbic respiratory failure - followed commands in [facility] ED after intubation 2) Anxiety - has baseline anxiety on home PRN BZD.  P:  - fentanyl and versed as needed for sedation - RASS goal 0  GLOBAL: A: Social situation: She went to a SNF but was non-compliant with CPAP.  Apparently she tried to change her HCPOA and change her will to reflect limited life support (no longer than a week), however this was never made official.  CLINICAL SUMMARY: 67 y/o morbidly obese female with hypercapnic respiratory failure and shock.  Unclear etiology of shock, presumably volume depletion vs sepsis.  Spoke with the pastor, only living family member is a first cousin who is to arrive today but evidently the patient had met with a lawyer prior to admission and wanted to be a full NCB but papers were never signed.  Will await first cousin and will likely extubate once ready with no intention to reintubate and make DNR.  CC time by me 35 minutes.  Alyson Reedy, M.D. [facility] Pulmonary/Critical Care Medicine. Pager: [phone]. After hours pager: [phone].

## 2012-11-16 ENCOUNTER — Inpatient Hospital Stay (HOSPITAL_COMMUNITY): Payer: Medicare Other

## 2012-11-16 LAB — BLOOD GAS, ARTERIAL
Acid-Base Excess: 9.8 mmol/L — ABNORMAL HIGH (ref 0.0–2.0)
Drawn by: 352351
Drawn by: 352351
FIO2: 0.4 %
MECHVT: 550 mL
O2 Saturation: 96.6 %
O2 Saturation: 96.4 %
PEEP: 5 cmH2O
RATE: 18 resp/min
RATE: 18 resp/min
TCO2: 35.4 mmol/L (ref 0–100)
pCO2 arterial: 51.4 mmHg — ABNORMAL HIGH (ref 35.0–45.0)
pCO2 arterial: 47.6 mmHg — ABNORMAL HIGH (ref 35.0–45.0)
pH, Arterial: 7.479 — ABNORMAL HIGH (ref 7.350–7.450)
pO2, Arterial: 88.8 mmHg (ref 80.0–100.0)
pO2, Arterial: 88.5 mmHg (ref 80.0–100.0)

## 2012-11-16 LAB — PHOSPHORUS: Phosphorus: 3.6 mg/dL (ref 2.3–4.6)

## 2012-11-16 LAB — URINE CULTURE

## 2012-11-16 LAB — GLUCOSE, CAPILLARY
Glucose-Capillary: 159 mg/dL — ABNORMAL HIGH (ref 70–99)
Glucose-Capillary: 173 mg/dL — ABNORMAL HIGH (ref 70–99)
Glucose-Capillary: 147 mg/dL — ABNORMAL HIGH (ref 70–99)

## 2012-11-16 LAB — BASIC METABOLIC PANEL
CO2: 35 mEq/L — ABNORMAL HIGH (ref 19–32)
Chloride: 94 mEq/L — ABNORMAL LOW (ref 96–112)
GFR calc Af Amer: 52 mL/min — ABNORMAL LOW (ref >90)
Potassium: 4.1 mEq/L (ref 3.5–5.1)
Sodium: 141 mEq/L (ref 135–145)

## 2012-11-16 LAB — RESPIRATORY VIRUS PANEL
Adenovirus: NOT DETECTED
Influenza A: NOT DETECTED
Parainfluenza 2: NOT DETECTED
Parainfluenza 3: NOT DETECTED

## 2012-11-16 LAB — CBC
HCT: 43.1 % (ref 36.0–46.0)
MCV: 93.3 fL (ref 78.0–100.0)
RBC: 4.62 MIL/uL (ref 3.87–5.11)
WBC: 9.4 10*3/uL (ref 4.0–10.5)

## 2012-11-16 LAB — PROTIME-INR
INR: 2.14 — ABNORMAL HIGH (ref 0.00–1.49)
Prothrombin Time: 23 seconds — ABNORMAL HIGH (ref 11.6–15.2)

## 2012-11-16 MED ORDER — ACETAMINOPHEN 160 MG/5ML PO SOLN
650.0000 mg | ORAL | Status: DC | PRN
  Filled 2012-11-16: qty 20.3

## 2012-11-16 MED ORDER — FLUCONAZOLE IN SODIUM CHLORIDE 200-0.9 MG/100ML-% IV SOLN: 200.0000 mg | INTRAVENOUS | Status: DC

## 2012-11-16 MED ORDER — ACETAMINOPHEN 160 MG/5ML PO SOLN
325.0000 mg | ORAL | Status: DC | PRN
  Administered 2012-11-16: 650 mg
  Administered 2012-11-16 (×2): 325 mg
  Filled 2012-11-16 (×4): qty 20.3

## 2012-11-16 MED ORDER — HYDROCORTISONE SOD SUCCINATE 100 MG IJ SOLR
50.0000 mg | Freq: Four times a day (QID) | INTRAMUSCULAR | Status: DC
  Administered 2012-11-16 – 2012-11-18 (×10): 50 mg via INTRAVENOUS
  Filled 2012-11-16 (×12): qty 1

## 2012-11-16 MED ORDER — WARFARIN SODIUM 2.5 MG PO TABS
2.5000 mg | ORAL_TABLET | Freq: Once | ORAL | Status: AC
  Administered 2012-11-16: 2.5 mg via ORAL
  Filled 2012-11-16: qty 1

## 2012-11-16 MED ORDER — FLUCONAZOLE IN SODIUM CHLORIDE 400-0.9 MG/200ML-% IV SOLN: 400.0000 mg | Freq: Once | INTRAVENOUS | Status: DC

## 2012-11-16 MED ORDER — FLUCONAZOLE IN SODIUM CHLORIDE 200-0.9 MG/100ML-% IV SOLN
200.0000 mg | Freq: Once | INTRAVENOUS | Status: AC
  Administered 2012-11-16: 200 mg via INTRAVENOUS
  Filled 2012-11-16: qty 100

## 2012-11-16 MED ORDER — FLUCONAZOLE 100MG IVPB
100.0000 mg | INTRAVENOUS | Status: DC
  Administered 2012-11-17 – 2012-11-18 (×2): 100 mg via INTRAVENOUS
  Filled 2012-11-16 (×2): qty 50

## 2012-11-16 NOTE — Progress Notes (Signed)
ANTICOAGULATION CONSULT NOTE - Follow Up Consult  Pharmacy Consult for Warfarin Indication: Hx PE  No Known Allergies  Patient Measurements: Height: 5\' 5"  (165.1 cm) Weight: 273 lb 13 oz (124.2 kg) IBW/kg (Calculated) : 57   Vital Signs: Temp: 101.8 F (38.8 C) (01/27 0821) Temp src: Oral (01/27 0821) BP: 97/57 mmHg (01/27 0800) Pulse Rate: 81  (01/27 0800)  Labs:  Basename 11/16/12 0330 11/15/12 0419 11/14/12 0420  HGB 14.6 14.5 --  HCT 43.1 42.4 39.2  PLT 234 253 202  APTT -- -- --  LABPROT 23.0* 30.0* 38.0*  INR 2.14* 3.06* 4.21*  HEPARINUNFRC -- -- --  CREATININE 1.21* 1.11* 1.03  CKTOTAL -- -- --  CKMB -- -- --  TROPONINI -- -- --    Estimated Creatinine Clearance: 58.9 ml/min (by C-G formula based on Cr of 1.21).   Assessment: Patient is a 85 yoF on chronic warfarin therapy for history of PE. Current home regimen is warfarin 5mg  M,W,F,Sat,Sun and 7.5mg  on Tues and Thurs. On admission, INR supratherapeutic (INR 4.18). Warfarin was held on 1/23-1/26 while INR remained supratherapeutic.   Today, therapeutic INR 2.14, trending down nicely  During recent admission (1/13-1/20), pt appeared sensitive to warfarin requiring much lower doses than her home regimen. Plan is to be conservative with warfarin dosing during this admission based on recent history of elevated INRs.   No reports of bleeding per RN.   Goal of Therapy:  INR 2-3   Plan:  1. Warfarin 2.5 mg x 1 dose at 1800 today 2. Monitor for any signs/symptoms of bleeding 3. Follow up with INR in the a.m.   Al Decant, PharmD Candidate 11/16/2012  10:03 AM  I have reviewed clinical status of patient and agree with above plan. Also, note that patient started on low dose Diflucan which can also interact and cause a rise in INR further supporting conservative treatment with Warfarin.   Link Snuffer, PharmD, BCPS Clinical Pharmacist [phone] 11/16/2012, 10:20 AM

## 2012-11-16 NOTE — Progress Notes (Signed)
RN notified patient spiked T max 102.5.  Reviewed cultures Re-pancultured urine, respiratory, respiratory virus panel, blood  Ordered Tylenol solution per tube prn fever CXR in the am  Shirlee Latch MD [phone]

## 2012-11-16 NOTE — Progress Notes (Signed)
I have had extensive discussions with patient , alert and O x 4. We discussed patients current circumstances and organ failures. We also discussed patient's prior wishes under circumstances such as this. Family has decided to NOT perform resuscitation if arrest but to continue current medical support for now.  Mcarthur Rossetti. Tyson Alias, MD, FACP Pgr: [phone] [facility]  Max support wed then extubation likely

## 2012-11-16 NOTE — Progress Notes (Signed)
PULMONARY  / CRITICAL CARE MEDICINE   Name: Kathleen [name] MRN: [id number] DOB: [birth year]    LOS: 4  REFERRING MD : [facility]  CHIEF COMPLAINT:  Hypercapnic respiratory failure, Shock  BRIEF PATIENT DESCRIPTION:  67 yo with history of PE (therapeutic INR on Coumadin), OSA (noncompliant with BiPAP), on home oxygen and chronic opioids brought to ED 1/24 after developing respiratory failure, confusion, and hypotension at home.  She was recently discharged from [facility] after hypercapnic respiratory failure and cellulitis in the setting of a fall.   HPI: See brief patient descripton.  Additionally, it was noted by her pastor (only contact available) that she was becoming more somnolent and coughing more at her nursing facility the day prior to hospital admission (1/23).  As the night wore on she became unresponsive and was brought to [facility] ED where she was intubated without sedation.  He was unaware of any fever or chills.  However, he notes that she refused to wear her CPAP while at the nursing facility.  He reports that she had been trying to change her HCPOA to him, but she never signed the paperwork.  He contacted her next of kin (a step-granddaughter), who is on the way to the hospital. Per the pastor, he does not believe that Kathleen [name] wanted CPR, defibrillation, and would not like to be on the ventilator for an extended period. He says that she told him she definitely did not want a PEG; however, he is not legally her POA.  LINES / TUBES: Foley 1/24 >>> Left Subclavian 1/24 >>>  CULTURES: 1/23 - Urine Cx >> Neg 1/23 Blood Cx >> 1/24 - Blood culture x 2 >> 1/24 - Urine culture >> Neg 1/24 - Respiratory culture >> Oral flora 1/27 - Blood Cx >> 1/27 - Urine Cx >> 1/27 - Resp Cx >> 1/27 - Resp viral panel >>  ANTIBIOTICS: Vancomycin 1/24 >> Zosyn 1/24 >>  SIGNIFICANT EVENTS:  1/13 - Admitted for cellulitis, transferred to ICU for hypercarbic respiratory failure 1/14 - Remains intubated,  hypercarbia improving. SW consulted to help start search for SNF. 1/17 - Extubated. 1/20 - discharged to SNF =========== 1/24 Admitted from SNF >> [facility] >> [facility], intubated 1/26 - Pt anxious overnight, wants ETT out  SUBJECTIVE/ INTERVAL HISTORY: 1/27 - Febrile overnight, pancultured, neg  2.7 liters last 24 hrs  VITAL SIGNS: Temp:  [98.2 F (36.8 C)-102.9 F (39.4 C)] 99.1 F (37.3 C) (01/27 0400) Pulse Rate:  [71-94] 78  (01/27 0630) Resp:  [18-27] 18  (01/27 0630) BP: (86-118)/(43-75) 101/53 mmHg (01/27 0630) SpO2:  [93 %-100 %] 99 % (01/27 0630) FiO2 (%):  [30 %-40 %] 30 % (01/27 0600) Weight:  [273 lb 13 oz (124.2 kg)] 273 lb 13 oz (124.2 kg) (01/27 0400) HEMODYNAMICS: CVP:  [6 mmHg-10 mmHg] 10 mmHg VENTILATOR SETTINGS: Vent Mode:  [-] PRVC FiO2 (%):  [30 %-40 %] 30 % Set Rate:  [18 bmp] 18 bmp Vt Set:  [500 mL-550 mL] 550 mL PEEP:  [5 cmH20] 5 cmH20 Pressure Support:  [10 cmH20] 10 cmH20 Plateau Pressure:  [24 cmH20-26 cmH20] 25 cmH20  INTAKE / OUTPUT: Intake/Output      01/26 0701 - 01/27 0700   I.V. (mL/kg) 1181.9 (9.5)   NG/GT 80   IV Piggyback 625   Total Intake(mL/kg) 1886.9 (15.2)   Urine (mL/kg/hr) 4705 (1.6)   Stool 1   Total Output 4706   Net -2819.2  PHYSICAL EXAMINATION: Gen: morbidly obese, chronically ill appearing HEENT: NCAT, ETT in place PULM: Breath sounds diminished at bases CV: Distant, barely audible AB: Obese, BS+, soft, nontender Ext: Lower leg edema, feet cold, DP pulse present with doppler, non-palpable Neuro: Spontaneously opens eyes, follows commands Derm: Fungal appearing erythema in folds around pannus, no frank cellulitis; small area of redness on sacrum  IMAGING:  Dg Chest Port 1 View  11/15/2012  *RADIOLOGY REPORT*  Clinical Data: Endotracheal tube placement  PORTABLE CHEST - 1 VIEW  Comparison: 11/14/2012  Findings: Cardiomediastinal silhouette is stable.  Central mild vascular congestion without convincing pulmonary  edema. Improvement in aeration upper lobes.  Stable endotracheal and NG tube position.  Stable left subclavian central line position.  Mild right basilar atelectasis.  Small left pleural effusion with left basilar atelectasis or infiltrate.  IMPRESSION: Central mild vascular congestion without convincing pulmonary edema.  Improvement in aeration upper lobes.  Stable endotracheal and NG tube position.  Stable left subclavian central line position.  Mild right basilar atelectasis.  Small left pleural effusion with left basilar atelectasis or infiltrate.   Original Report Authenticated By: Natasha Mead, M.D.    EKG:  1/23 NSR, RBBB, TWI Ant precordial leads  DIAGNOSES: Principal Problem:  *Acute respiratory failure with hypercapnia Active Problems:  Personal history of pulmonary embolism  Acute encephalopathy  Morbid obesity  Obesity hypoventilation syndrome  Septic shock(785.52)  AKI (acute kidney injury)  HCAP (healthcare-associated pneumonia)   ASSESSMENT / PLAN:  PULMONARY  Lab 11/16/12 0420 11/15/12 0425 11/13/12 0542  PHART 7.467* 7.479* 7.511*  PCO2ART 47.6* 51.4* 41.5  PO2ART 88.5 88.8 229.0*  HCO3 34.0* 37.4* 33.0*  TCO2 35.4 38.9 34.3   A:  1) VDRF secondary to progressive chronic hypercarbic respiratory failure in setting of untreated OSA/OHS - was not using CPAP at nursing facility, so presumably worsening hypercapnea led to her admission/unresponsiveness  2) HCAP? 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  4) Suspected pulmonary hypertension - in setting of uncontrolled OSA due to non-compliance, diuresed, < -13L out this admission  P:   - PS trails ps 10 as goal -ABg reviewed, not breathing over set rate, then reduce rate  - q6h BD. - CPAP mandatory qhs after extubation. - See ID. - Once extubated no intention to  re-intubate -pcxr clearing with neg balance  CARDIOVASCULAR  Lab 11/13/12 0540 11/12/12 2325  CKTOTAL -- --  CKMB -- --  TROPONINI <0.30 <0.30   A: 1) Shock, unclear etiology: volume depletion? Septic or cardiogenic? No ischemic changes and no clear source of infection Still requiring pressors, been getting Lasix 2/2 pulm edema, will hold today Lactic acid normal  Trop neg x2 2) Chronic coumadin therapy 2/2 PE - INR supratherapeutic on admission  3) meets criteria for rel AI, cortisol on 24th P:  - Hold home Univasc/Metoprolol. - CVP monitoring -10 - KVO IVF. - Levo as needed for MAP 60 - Coumadin per pharmacy, restarting today -dc lasix for today, see renal -add stress steroids   RENAL  Lab 11/16/12 0330 11/15/12 0419 11/14/12 0420  NA 141 142 142  K 4.1 3.7 3.8  CL 94* 93* 94*  CO2 35* 39* 39*  BUN 73* 58* 42*  CREATININE 1.21* 1.11* 1.03  GLUCOSE 194* 168* 141*   A:  AKI on admission, uncertain etiology; presumably pre-renal (FeNa 0.1) due  to hypotension/sepsis Cr increase 2/2 diuresis P:  - Hold Lasix - Replacing electrolytes as needed - Foley - Monitor uop - Renal U/S if renal function deteriorates. - KVO IVF  GASTROINTESTINAL  Lab 11/12/12 2325  AST 43*  ALT 27  ALKPHOS 69  BILITOT 0.2*  PROT 7.5  ALBUMIN 2.5*   A: No acute issues. P:  Nutrition for TF. Zantac for PPx. Last BM 1/26  HEMATOLOGIC  Lab 11/16/12 0330 11/15/12 0419 11/14/12 0420  WBC 9.4 10.0 9.4  HGB 14.6 14.5 12.7  HCT 43.1 42.4 39.2  PLT 234 253 202  APTT -- -- --  INR 2.14* 3.06* 4.21*    A:  1) Chronic coumadin in setting of chronic PE - supratherapeutic on admit.   P:  - Daily PT/INR - Continue warfarin per pharmacy consult, goal INR 2-3  INFECTIOUS  Lab 11/16/12 0330 11/15/12 0419 11/14/12 0420 11/13/12 0540 11/12/12 2330 11/12/12 2325  WBC 9.4 10.0 9.4 -- -- --  NEUTROABS -- -- -- -- -- 4.7  LATICACIDVEN -- -- -- 1.4 1.5 --  PROCALCITON -- -- -- 0.14 -- --    A:  1) SIRS/ Septic shock? Cx negative to date. Febrile overnight, no leukocytosis, still requiring pressors nosocomial P:  - f/u cultures - vanc/zosyn, consider addition antifungal systemic for dermatitis fungal likely -if spike further change zosyn to Imi   -repeat UA, BC, sputum  ENDOCRINE  Lab 11/16/12 0404 11/16/12 0017 11/15/12 1949 11/15/12 1544 11/15/12 1204  GLUCAP 159* 154* 148* 155* 124*   A:  1) Prediabetes - A1c 6.3 2) meets criteria Rel AI P:  - ICU hyperglycemia protocol - Will need diabetes RN to provide education after extubation -add roids  NEUROLOGIC / PSYCHIATRIC  A:  1) Acute encephalopathy secondary to acute on chronic hypercarbic respiratory failure - following commands after intubation 2) Anxiety - has baseline anxiety on home PRN BZD.  P:  - fentanyl and versed as needed for sedation - RASS -1, goal 0 -dc precedex as remains in shock -consider fent drip  GLOBAL: A: Social situation: She went to a SNF but was non-compliant with CPAP.  Apparently she tried to change her HCPOA and change her will to reflect limited life support (no longer than a week), however this was never made official. Will clarify her prior wishes from those she spoke to   CLINICAL SUMMARY:  Fever, weaning, dc lasix, will clarify wishes  CC time by me 35 minutes.  Mcarthur Rossetti. Tyson Alias, MD, FACP Pgr: [phone]  Pulmonary & Critical Care

## 2012-11-17 ENCOUNTER — Inpatient Hospital Stay (HOSPITAL_COMMUNITY): Payer: Medicare Other

## 2012-11-17 LAB — BASIC METABOLIC PANEL
BUN: 83 mg/dL — ABNORMAL HIGH (ref 6–23)
BUN: 78 mg/dL — ABNORMAL HIGH (ref 6–23)
Calcium: 9.2 mg/dL (ref 8.4–10.5)
Chloride: 101 mEq/L (ref 96–112)
Creatinine, Ser: 1.04 mg/dL (ref 0.50–1.10)
GFR calc non Af Amer: 54 mL/min — ABNORMAL LOW (ref >90)
Glucose, Bld: 187 mg/dL — ABNORMAL HIGH (ref 70–99)
Glucose, Bld: 177 mg/dL — ABNORMAL HIGH (ref 70–99)
Potassium: 3.1 mEq/L — ABNORMAL LOW (ref 3.5–5.1)
Sodium: 144 mEq/L (ref 135–145)

## 2012-11-17 LAB — GLUCOSE, CAPILLARY
Glucose-Capillary: 166 mg/dL — ABNORMAL HIGH (ref 70–99)
Glucose-Capillary: 150 mg/dL — ABNORMAL HIGH (ref 70–99)
Glucose-Capillary: 141 mg/dL — ABNORMAL HIGH (ref 70–99)

## 2012-11-17 LAB — CBC
MCH: 31.1 pg (ref 26.0–34.0)
MCHC: 32.6 g/dL (ref 30.0–36.0)
Platelets: 210 10*3/uL (ref 150–400)
RDW: 16.6 % — ABNORMAL HIGH (ref 11.5–15.5)

## 2012-11-17 LAB — MAGNESIUM: Magnesium: 2.5 mg/dL (ref 1.5–2.5)

## 2012-11-17 MED ORDER — POTASSIUM CHLORIDE 20 MEQ/15ML (10%) PO LIQD
40.0000 meq | ORAL | Status: AC
  Administered 2012-11-17 – 2012-11-18 (×2): 40 meq via ORAL
  Filled 2012-11-17 (×2): qty 30

## 2012-11-17 MED ORDER — POTASSIUM CHLORIDE 20 MEQ/15ML (10%) PO LIQD
ORAL | Status: AC
  Administered 2012-11-17: 40 meq via ORAL
  Filled 2012-11-17: qty 30

## 2012-11-17 MED ORDER — AMIODARONE HCL IN DEXTROSE 360-4.14 MG/200ML-% IV SOLN
30.0000 mg/h | INTRAVENOUS | Status: DC
  Administered 2012-11-17 – 2012-11-18 (×2): 30 mg/h via INTRAVENOUS
  Filled 2012-11-17 (×3): qty 200

## 2012-11-17 MED ORDER — PIPERACILLIN-TAZOBACTAM 3.375 G IVPB
3.3750 g | Freq: Three times a day (TID) | INTRAVENOUS | Status: DC
  Administered 2012-11-17 – 2012-11-18 (×4): 3.375 g via INTRAVENOUS
  Filled 2012-11-17 (×7): qty 50

## 2012-11-17 MED ORDER — PHENYLEPHRINE HCL 10 MG/ML IJ SOLN
30.0000 ug/min | INTRAVENOUS | Status: DC
  Administered 2012-11-17: 30 ug/min via INTRAVENOUS
  Filled 2012-11-17: qty 1

## 2012-11-17 MED ORDER — FUROSEMIDE 10 MG/ML IJ SOLN
40.0000 mg | Freq: Four times a day (QID) | INTRAMUSCULAR | Status: AC
  Administered 2012-11-17 – 2012-11-18 (×4): 40 mg via INTRAVENOUS
  Filled 2012-11-17 (×4): qty 4

## 2012-11-17 MED ORDER — POTASSIUM CHLORIDE 20 MEQ/15ML (10%) PO LIQD
40.0000 meq | Freq: Once | ORAL | Status: AC
  Administered 2012-11-17: 40 meq
  Filled 2012-11-17: qty 30

## 2012-11-17 MED ORDER — PHENYLEPHRINE HCL 10 MG/ML IJ SOLN
30.0000 ug/min | INTRAVENOUS | Status: DC
  Administered 2012-11-17: 45 ug/min via INTRAVENOUS
  Administered 2012-11-17: 30 ug/min via INTRAVENOUS
  Filled 2012-11-17 (×2): qty 4

## 2012-11-17 MED ORDER — METOPROLOL TARTRATE 1 MG/ML IV SOLN
5.0000 mg | Freq: Four times a day (QID) | INTRAVENOUS | Status: DC | PRN
  Administered 2012-11-17: 5 mg via INTRAVENOUS
  Filled 2012-11-17: qty 5

## 2012-11-17 MED ORDER — WARFARIN SODIUM 2.5 MG PO TABS
2.5000 mg | ORAL_TABLET | Freq: Once | ORAL | Status: AC
  Administered 2012-11-17: 2.5 mg via ORAL
  Filled 2012-11-17: qty 1

## 2012-11-17 MED ORDER — AMIODARONE HCL IN DEXTROSE 360-4.14 MG/200ML-% IV SOLN
60.0000 mg/h | INTRAVENOUS | Status: AC
  Administered 2012-11-17 (×2): 60 mg/h via INTRAVENOUS
  Filled 2012-11-17: qty 200

## 2012-11-17 MED ORDER — AMIODARONE HCL IN DEXTROSE 360-4.14 MG/200ML-% IV SOLN
INTRAVENOUS | Status: AC
  Filled 2012-11-17: qty 200

## 2012-11-17 MED ORDER — AMIODARONE LOAD VIA INFUSION
150.0000 mg | Freq: Once | INTRAVENOUS | Status: AC
  Administered 2012-11-17: 150 mg via INTRAVENOUS
  Filled 2012-11-17: qty 83.34

## 2012-11-17 MED ORDER — METOPROLOL TARTRATE 25 MG PO TABS
25.0000 mg | ORAL_TABLET | Freq: Two times a day (BID) | ORAL | Status: DC
  Administered 2012-11-17 – 2012-11-18 (×2): 25 mg via ORAL
  Filled 2012-11-17 (×4): qty 1

## 2012-11-17 NOTE — Progress Notes (Signed)
CRITICAL CARE RESIDENT NOTE Interim Progress Note    I was called overnight by the RN for evaluation of afib with RVR . Patient was given a bath when her HR increased and was found to be in Afib with RVR. Patient feels if her heart was racing. Patient currently on Levo 5 mcg with MAP of 60.  Sats stable    Plan: - ECG stat - Obtain Bmet and Mag now - change Levo to Neo with a MAP of 60s     Signed: Almyra Deforest, MD  PGY-III, Internal Medicine Resident  11/17/2012, 3:39 AM

## 2012-11-17 NOTE — Progress Notes (Signed)
ANTICOAGULATION CONSULT NOTE - Follow Up Consult  Pharmacy Consult for Warfarin Indication: Hx PE  No Known Allergies  Patient Measurements: Height: 5\' 5"  (165.1 cm) Weight: 277 lb 1.9 oz (125.7 kg) IBW/kg (Calculated) : 57   Vital Signs: Temp: 98.5 F (36.9 C) (01/28 0400) Temp src: Oral (01/28 0400) BP: 96/46 mmHg (01/28 0600) Pulse Rate: 76  (01/28 0600)  Labs:  Basename 11/17/12 0346 11/16/12 0330 11/15/12 0419  HGB 14.0 14.6 --  HCT 42.9 43.1 42.4  PLT 210 234 253  APTT -- -- --  LABPROT 23.4* 23.0* 30.0*  INR 2.19* 2.14* 3.06*  HEPARINUNFRC -- -- --  CREATININE 1.04 1.21* 1.11*  CKTOTAL -- -- --  CKMB -- -- --  TROPONINI -- -- --    Estimated Creatinine Clearance: 69.1 ml/min (by C-G formula based on Cr of 1.04).   Assessment: Patient is a 74 yoF on chronic warfarin therapy for history of PE. Current home regimen is warfarin 5mg  M,W,F,Sat,Sun and 7.5mg  on Tues and Thurs. During recent admission (1/13-1/20), pt appeared sensitive to warfarin requiring much lower doses than her home regimen. Plan is to be conservative with warfarin dosing during this admission based on recent history of elevated INR.   1/23: pt admitted with INR supratherapeutic (INR 4.18). Warfarin held 1/23-1/26 to allow decrease to therapeutic range.  1/27: INR 2.14, restarted warfarin with 2.5mg  dose x1.  1/27: Fluconazole added with planned length of therapy  of 6 days. Potential interaction can cause elevation in INR, thus will continue with conservative warfarin dosing.   1/28: INR 2.19 therapeutic  CBC remains stable and no reports of bleeding per RN.   Goal of Therapy:  INR 2-3   Plan:  1. Warfarin 2.5 mg x 1 dose at 1800 today 2. Monitor for any signs/symptoms of bleeding and CBC. 3. Follow up with INR in the a.m.   Kathleen [name], PharmD Candidate 11/17/2012  8:16 AM  I have reviewed the patient's clinical status and I agree with above assessment and plan.   Kathleen [name],  PharmD, BCPS Clinical Pharmacist [phone] 11/17/2012, 8:58 AM

## 2012-11-17 NOTE — Progress Notes (Signed)
PULMONARY  / CRITICAL CARE MEDICINE   Name: Kathleen [name] MRN: [id number] DOB: 05/04/1946    LOS: 5  REFERRING MD : [facility]  CHIEF COMPLAINT:  Hypercapnic respiratory failure, Shock  BRIEF PATIENT DESCRIPTION:  67 yo with history of PE (therapeutic INR on Coumadin), OSA (noncompliant with BiPAP), on home oxygen and chronic opioids brought to ED 1/24 after developing respiratory failure, confusion, and hypotension at home.  She was recently discharged from [facility] after hypercapnic respiratory failure and cellulitis in the setting of a fall.   HPI: See brief patient descripton.  Additionally, it was noted by her pastor (only contact available) that she was becoming more somnolent and coughing more at her nursing facility the day prior to hospital admission (1/23).  As the night wore on she became unresponsive and was brought to [facility] ED where she was intubated without sedation.  He was unaware of any fever or chills.  However, he notes that she refused to wear her CPAP while at the nursing facility.  He reports that she had been trying to change her HCPOA to him, but she never signed the paperwork.  He contacted her next of kin (a step-granddaughter), who is on the way to the hospital. Per the pastor, he does not believe that Kathleen [name] wanted CPR, defibrillation, and would not like to be on the ventilator for an extended period. He says that she told him she definitely did not want a PEG; however, he is not legally her POA.  LINES / TUBES: Foley 1/24 >>> Left Subclavian 1/24 >>>  CULTURES: 1/23 - Urine Cx >> Neg 1/23 - Blood Cx >> 1/24 - Blood culture x 2 >> 1/24 - Urine culture >> Neg 1/24 - Respiratory culture >> Oral flora 1/27 - Blood Cx >> 1/27 - Urine Cx >> Neg 1/27 - Resp Cx >> 1/27 - Resp viral panel >> Neg  ANTIBIOTICS: Vancomycin 1/24 >> Zosyn 1/24 >> Diflucan 1/27 >>  SIGNIFICANT EVENTS:  1/13 - Admitted for cellulitis, transferred to ICU for hypercarbic respiratory  failure 1/14 - Remains intubated, hypercarbia improving. SW consulted to help start search for SNF. 1/17 - Extubated. 1/20 - discharged to SNF =========== 1/24 Admitted from SNF >> [facility] >> [facility], intubated 1/26 - Pt anxious overnight, wants ETT out  SUBJECTIVE/ INTERVAL HISTORY: 1/27 - Febrile overnight, pancultured, neg  2.7 liters last 24 hrs; pt to be DNR/DNI after extubation  VITAL SIGNS: Temp:  [97.4 F (36.3 C)-103.2 F (39.6 C)] 98.5 F (36.9 C) (01/28 0400) Pulse Rate:  [35-146] 91  (01/28 0500) Resp:  [14-28] 18  (01/28 0500) BP: (74-154)/(36-127) 112/57 mmHg (01/28 0500) SpO2:  [93 %-100 %] 98 % (01/28 0500) FiO2 (%):  [40 %] 40 % (01/28 0500) Weight:  [277 lb 1.9 oz (125.7 kg)] 277 lb 1.9 oz (125.7 kg) (01/28 0400) HEMODYNAMICS: CVP:  [9 mmHg-11 mmHg] 9 mmHg VENTILATOR SETTINGS: Vent Mode:  [-] PRVC FiO2 (%):  [40 %] 40 % Set Rate:  [14 bmp-18 bmp] 14 bmp Vt Set:  [550 mL] 550 mL PEEP:  [5 cmH20] 5 cmH20 Pressure Support:  [10 cmH20] 10 cmH20 Plateau Pressure:  [20 cmH20-25 cmH20] 23 cmH20  INTAKE / OUTPUT: Intake/Output      01/27 0701 - 01/28 0700   I.V. (mL/kg) 1120.3 (8.9)   NG/GT 620   IV Piggyback 612.5   Total Intake(mL/kg) 2352.8 (18.7)   Urine (mL/kg/hr) 2451 (0.8)   Total Output 2451   Net -98.3  PHYSICAL EXAMINATION: Gen: morbidly obese, chronically ill appearing HEENT: NCAT, ETT in place PULM: Breath sounds diminished at bases CV: Distant, heart sounds AB: Obese, BS+, soft, nontender Ext: Lower leg edema, feet cold, DP pulse present with doppler, non-palpable Neuro: Spontaneously opens eyes, follows commands, nods head in understanding Derm: Fungal dermatitis in folds around pannus, no frank cellulitis; small area of redness on sacrum  IMAGING:  Dg Chest Port 1 View  11/16/2012  *RADIOLOGY REPORT*  Clinical Data: Endotracheal tube placement  PORTABLE CHEST - 1 VIEW  Comparison: Yesterday  Findings: Endotracheal tube, NG tube, left  subclavian central venous catheter are stable.  Stable diffuse edema.  Stable bilateral pleural effusions.  Low volumes.  IMPRESSION: Stable edema and pleural effusions.   Original Report Authenticated By: Jolaine Click, M.D.    Dg Chest Port 1 View  11/15/2012  *RADIOLOGY REPORT*  Clinical Data: Endotracheal tube placement  PORTABLE CHEST - 1 VIEW  Comparison: 11/14/2012  Findings: Cardiomediastinal silhouette is stable.  Central mild vascular congestion without convincing pulmonary edema. Improvement in aeration upper lobes.  Stable endotracheal and NG tube position.  Stable left subclavian central line position.  Mild right basilar atelectasis.  Small left pleural effusion with left basilar atelectasis or infiltrate.  IMPRESSION: Central mild vascular congestion without convincing pulmonary edema.  Improvement in aeration upper lobes.  Stable endotracheal and NG tube position.  Stable left subclavian central line position.  Mild right basilar atelectasis.  Small left pleural effusion with left basilar atelectasis or infiltrate.   Original Report Authenticated By: Natasha Mead, M.D.    EKG:  1/23 NSR, RBBB, TWI Ant precordial leads  DIAGNOSES: Principal Problem:  *Acute respiratory failure with hypercapnia Active Problems:  Personal history of pulmonary embolism  Acute encephalopathy  Morbid obesity  Obesity hypoventilation syndrome  Septic shock(785.52)  AKI (acute kidney injury)  HCAP (healthcare-associated pneumonia)   ASSESSMENT / PLAN:  PULMONARY  Lab 11/16/12 0420 11/15/12 0425 11/13/12 0542  PHART 7.467* 7.479* 7.511*  PCO2ART 47.6* 51.4* 41.5  PO2ART 88.5 88.8 229.0*  HCO3 34.0* 37.4* 33.0*  TCO2 35.4 38.9 34.3   A:  1) VDRF secondary to progressive chronic hypercarbic respiratory failure in setting of untreated OSA/OHS - was not using CPAP at nursing facility, so presumably worsening hypercapnea led to her admission/unresponsiveness  2) HCAP? 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  4) Suspected pulmonary hypertension - in setting of uncontrolled OSA due to non-compliance, diuresed, < -13L out this admission  P:   - PS trails ps to goal 8 as goal, goal 2 hrs - q6h BD. - See ID. - Once extubated no intention to re-intubate - CPAP mandatory qhs after extubation. -for neg balance  CARDIOVASCULAR  Lab 11/13/12 0540 11/12/12 2325  CKTOTAL -- --  CKMB -- --  TROPONINI <0.30 <0.30   A: 1) Shock, unclear etiology: volume depletion? Septic or cardiogenic? No ischemic changes and no clear source of infection Still requiring pressors, been getting Lasix 2/2 pulm edema, held 1/27 Lactic acid normal  Trop neg x2 Levo changed to Neo 2/2 Afib with RVR after bath overnight 2) Chronic coumadin therapy 2/2 PE - INR supratherapeutic on admission  3) Meets criteria for rel AI, cortisol 14.2 on 24th P:  - Hold home Univasc/Metoprolol. - CVP monitoring -9 - KVO IVF. - Neo as  needed for MAP 55 and good neurostatus - Coumadin per pharmacy, restarted 1/27 - Stress steroids remain -accuracy of BP cuff, may need a line  RENAL  Lab 11/17/12 0346 11/16/12 0330 11/15/12 0419  NA 144 141 142  K 3.4* 4.1 3.7  CL 100 94* 93*  CO2 34* 35* 39*  BUN 83* 73* 58*  CREATININE 1.04 1.21* 1.11*  GLUCOSE 187* 194* 168*   A:  AKI on admission, uncertain etiology; presumably pre-renal (FeNa 0.1) due to hypotension/sepsis Cr increase 2/2 diuresis, improving Hypokalemia P:  - Restart Lasix 40 IV q6 x4 - Replacing electrolytes as needed - Foley - Monitor uop - Renal U/S if renal function deteriorates. - KVO IVF  GASTROINTESTINAL  Lab 11/12/12 2325  AST 43*  ALT 27  ALKPHOS 69  BILITOT 0.2*  PROT 7.5  ALBUMIN 2.5*   A: No acute issues. P:  Nutrition for TF, some issue residuals, do not hold Tf unless  res greater 400 Zantac for PPx. Last BM 1/27  HEMATOLOGIC  Lab 11/17/12 0346 11/16/12 0330 11/15/12 0419  WBC 13.3* 9.4 10.0  HGB 14.0 14.6 14.5  HCT 42.9 43.1 42.4  PLT 210 234 253  APTT -- -- --  INR 2.19* 2.14* 3.06*    A:  1) Chronic coumadin in setting of chronic PE - supratherapeutic on admit.   P:  - Daily PT/INR - Continue warfarin per pharmacy consult, goal INR 2-3 -will again clarify no wishes trach  INFECTIOUS  Lab 11/17/12 0346 11/16/12 0330 11/15/12 0419 11/13/12 0540 11/12/12 2330 11/12/12 2325  WBC 13.3* 9.4 10.0 -- -- --  NEUTROABS -- -- -- -- -- 4.7  LATICACIDVEN -- -- -- 1.4 1.5 --  PROCALCITON 0.28 -- -- 0.14 -- --   A:  1) SIRS/ Septic shock? Cx negative to date. Afebrile overnight, no Tylenol given since yesterday at noon. Now with leukocytosis Still requiring pressors nosocomial P:  - F/u cultures - Vanc/zosyn, added Diflucan for dermatitis fungal likely - Repeating UA, BC, sputum -fever curve improved  ENDOCRINE  Lab 11/17/12 0348 11/16/12 2346 11/16/12 2011 11/16/12 1618 11/16/12 1212  GLUCAP 166* 193* 179* 197* 147*   A:  1) Prediabetes - A1c 6.3 2) meets criteria Rel AI P:  - ICU hyperglycemia protocol - Will need diabetes RN to provide education after extubation - Stress dose steroids, maintain  NEUROLOGIC / PSYCHIATRIC  A:  1) Acute encephalopathy secondary to acute on chronic hypercarbic respiratory failure - following commands after intubation 2) Anxiety - has baseline anxiety on home PRN BZD.  P:  - Fent drip - Fentanyl and versed as needed for sedation - RASS -1, goal 0  GLOBAL: A: Social situation: She went to a SNF but was non-compliant with CPAP.  Apparently she tried to change her HCPOA and change her will to reflect limited life support (no longer than a week), however this was never made official. Discussed code status with pt yesterday, as she was A&Ox4. Will extubate on Wed, 1/29; after extubation, pt will  remain DNR/DNI. Will again confirm this wishes  CLINICAL SUMMARY:  Afebrile, now with leukocytosis, weaning, restating lasix, pt to be extubated on 1/29 and will remain DNR/DNI.  CC time by me 30 minutes.  Mcarthur Rossetti. Tyson Alias, MD, FACP Pgr: [phone] [facility]

## 2012-11-17 NOTE — Progress Notes (Signed)
CRITICAL CARE RESIDENT NOTE Interim Progress Note  Called by RN regarding the following results, with the interventions as listed below:   RESULT INTERVENTION TAKEN  1. K 3.4  potassium chloride 40 meq x1    Patient was found to be now bradycardic in the 50s . Will continue to monitor . Discussed with Dr Marin Shutter.   Signed: Almyra Deforest, MD  PGY-III, Internal Medicine Resident  11/17/2012, 5:07 AM

## 2012-11-17 NOTE — Progress Notes (Signed)
[facility] ADULT ICU REPLACEMENT PROTOCOL FOR AM LAB REPLACEMENT ONLY  The patient does not apply for the [facility] Adult ICU Electrolyte Replacment Protocol based on the criteria listed below:      K 3.4 1. Is BUN < 60 mg/dL? no  Patient's BUN today is 4     Dr Lanora Manis Deterding notified  [place] 11/17/2012 5:14 AM

## 2012-11-17 NOTE — Progress Notes (Signed)
CRITICAL CARE RESIDENT NOTE Interim Progress Note   Subjective:    I was called by the RN for evaluation of arrhythmia.  On arrival to the room Ms. Feeley was noted to be in a tachycardic rhythm that was regular with rates in the 130-140s.  Patient was awake and alert and denied pain or increased shortness of breath or trouble breathing.  The rhythm appeared to be either an SVT or an A. Fib with abherrent conduction on the monitor.  The patient then spontaneously converted to normal sinus rhythm.  She fluctuated several times between the two rhythms.   Objective:    BP 97/45  Pulse 79  Temp 98.5 F (36.9 C) (Oral)  Resp 22  Ht 5\' 5"  (1.651 m)  Wt 277 lb 1.9 oz (125.7 kg)  BMI 46.11 kg/m2  SpO2 99%   Labs: Basic Metabolic Panel:    Component Value Date/Time   NA 144 11/17/2012 0346   K 3.4* 11/17/2012 0346   CL 100 11/17/2012 0346   CO2 34* 11/17/2012 0346   BUN 83* 11/17/2012 0346   CREATININE 1.04 11/17/2012 0346   GLUCOSE 187* 11/17/2012 0346   CALCIUM 9.2 11/17/2012 0346   CBC:    Component Value Date/Time   WBC 13.3* 11/17/2012 0346   WBC 8.4 01/22/2012 1000   HGB 14.0 11/17/2012 0346   HGB 14.1 01/22/2012 1000   HCT 42.9 11/17/2012 0346   HCT 41.6 01/22/2012 1000   PLT 210 11/17/2012 0346   PLT 186 01/22/2012 1000   MCV 95.3 11/17/2012 0346   MCV 95.0 01/22/2012 1000   NEUTROABS 4.7 11/12/2012 2325   NEUTROABS 5.5 01/22/2012 1000   LYMPHSABS 1.2 11/12/2012 2325   LYMPHSABS 1.7 01/22/2012 1000   MONOABS 0.8 11/12/2012 2325   MONOABS 0.9 01/22/2012 1000   EOSABS 0.1 11/12/2012 2325   EOSABS 0.2 01/22/2012 1000   BASOSABS 0.0 11/12/2012 2325   BASOSABS 0.0 01/22/2012 1000   Cardiac Enzymes: Lab Results  Component Value Date   CKTOTAL 245* 06/19/2011   CKMB 7.4* 06/19/2011   TROPONINI <0.30 11/13/2012   Physical Exam: General: Vital signs reviewed and noted. Well-developed, well-nourished, in no acute distress; alert, appropriate and cooperative throughout examination.  Lungs:  Normal  respiratory effort. Clear to auscultation BL without crackles or wheezes.  Heart: RRR. S1 and S2 normal without gallop, murmur, or rubs.  Abdomen:  BS normoactive. Soft, Nondistended, non-tender.  No masses or organomegaly.  Extremities: No pretibial edema.     Assessment/ Plan:    1. Wide QRS tachyarrhythmia: On the monitor the patient had an irregular wide QRS rhythm that spontaneously converted to sinus rhythm.  The patient has a history of atrial fibrillation, on coumadin.  The rhythm appeared to be either an SVT or an atrial fibrillation with abhorrent conduction.  She is a DNR with no CPR, shock, or medications but she is currently alert and not in distress with a good blood pressure so we will treat her arrhythmia.  Plan:   - Amiodarone bolus 150 mg x1 - amiodarone drip starting at 60 mg/hr for 6 hours then decrease to 30 mg/hr.  - 12 lead EKG - Bmet to check potassium.   Case and plan of care was discussed with attending physician, Dr. Tyson Alias.   Signed: Leodis Sias, MD PGY-III, Internal Medicine Resident Pager: [phone] (7PM-7AM) 11/17/2012, 3:19 PM    Addendum:  Following amiodarone bolus and start of the amiodarone drip the patient continued to be in a wide  complex tachycardia.  EKG reviewed with Dr. Tyson Alias and felt to be more consistent with SVT.  Will restart home Metoprolol at 25 mg BID and give Metoprolol 5 mg IV q6 PRN for HR >120 with parameters to hold for SBP <90.  Arrianna Catala, MD Internal Medicine Resident, PGY III Co-Chief Resident, Internal Medicine Pager: [phone] 11/17/2012 6:14 PM

## 2012-11-17 NOTE — Progress Notes (Signed)
ANTIBIOTIC CONSULT NOTE - Follow-Up  Pharmacy Consult for vancomycin, Zosyn Indication: rule out pneumonia/sepsis  No Known Allergies  Patient Measurements: Height: 5\' 5"  (165.1 cm) Weight: 277 lb 1.9 oz (125.7 kg) IBW/kg (Calculated) : 57   Vital Signs: Temp: 97.8 F (36.6 C) (01/28 1600) Temp src: Oral (01/28 1600) BP: 90/55 mmHg (01/28 1519) Pulse Rate: 132  (01/28 1519) Intake/Output from previous day: 01/27 0701 - 01/28 0700 In: 2387.8 [I.V.:1135.3; NG/GT:640; IV Piggyback:612.5] Out: 2451 [Urine:2451] Intake/Output from this shift:    Labs:  Basename 11/17/12 0346 11/16/12 0330 11/15/12 0419  WBC 13.3* 9.4 10.0  HGB 14.0 14.6 14.5  PLT 210 234 253  LABCREA -- -- --  CREATININE 1.04 1.21* 1.11*   Estimated Creatinine Clearance: 69.1 ml/min (by C-G formula based on Cr of 1.04). No results found for this basename: VANCOTROUGH:2,VANCOPEAK:2,VANCORANDOM:2,GENTTROUGH:2,GENTPEAK:2,GENTRANDOM:2,TOBRATROUGH:2,TOBRAPEAK:2,TOBRARND:2,AMIKACINPEAK:2,AMIKACINTROU:2,AMIKACIN:2, in the last 72 hours   Microbiology: Recent Results (from the past 720 hour(s))  MRSA PCR SCREENING     Status: Normal   Collection Time   11/03/12  1:18 AM      Component Value Range Status Comment   MRSA by PCR NEGATIVE  NEGATIVE Final   URINE CULTURE     Status: Normal   Collection Time   11/03/12  1:18 AM      Component Value Range Status Comment   Specimen Description URINE, CATHETERIZED   Final    Special Requests NONE   Final    Culture  Setup Time 11/03/2012 09:24   Final    Colony Count NO GROWTH   Final    Culture NO GROWTH   Final    Report Status 11/04/2012 FINAL   Final   CULTURE, BLOOD (ROUTINE X 2)     Status: Normal   Collection Time   11/03/12  2:00 AM      Component Value Range Status Comment   Specimen Description BLOOD RIGHT ARM   Final    Special Requests     Final    Value: BOTTLES DRAWN AEROBIC AND ANAEROBIC 10CC BLUE,8CC RED   Culture  Setup Time 11/03/2012 09:28    Final    Culture NO GROWTH 5 DAYS   Final    Report Status 11/09/2012 FINAL   Final   CULTURE, BLOOD (ROUTINE X 2)     Status: Normal   Collection Time   11/03/12  2:15 AM      Component Value Range Status Comment   Specimen Description BLOOD RIGHT HAND   Final    Special Requests BOTTLES DRAWN AEROBIC ONLY 8CC   Final    Culture  Setup Time 11/03/2012 09:28   Final    Culture NO GROWTH 5 DAYS   Final    Report Status 11/09/2012 FINAL   Final   CULTURE, RESPIRATORY     Status: Normal   Collection Time   11/04/12 10:20 AM      Component Value Range Status Comment   Specimen Description TRACHEAL ASPIRATE   Final    Special Requests NONE   Final    Gram Stain     Final    Value: ABUNDANT WBC PRESENT,BOTH PMN AND MONONUCLEAR     RARE SQUAMOUS EPITHELIAL CELLS PRESENT     RARE GRAM POSITIVE COCCI IN PAIRS   Culture NO GROWTH 2 DAYS   Final    Report Status 11/06/2012 FINAL   Final   CULTURE, BLOOD (ROUTINE X 2)     Status: Normal (Preliminary result)   Collection  Time   11/12/12 11:45 PM      Component Value Range Status Comment   Specimen Description BLOOD LEFT ANTECUBITAL   Final    Special Requests BOTTLES DRAWN AEROBIC AND ANAEROBIC 6CC EACH   Final    Culture NO GROWTH 4 DAYS   Final    Report Status PENDING   Incomplete   CULTURE, BLOOD (ROUTINE X 2)     Status: Normal (Preliminary result)   Collection Time   11/12/12 11:50 PM      Component Value Range Status Comment   Specimen Description BLOOD LEFT ANTECUBITAL   Final    Special Requests BOTTLES DRAWN AEROBIC AND ANAEROBIC 6 CC EACH   Final    Culture NO GROWTH 4 DAYS   Final    Report Status PENDING   Incomplete   URINE CULTURE     Status: Normal   Collection Time   11/13/12 12:40 AM      Component Value Range Status Comment   Specimen Description URINE, CATHETERIZED   Final    Special Requests NONE   Final    Culture  Setup Time 11/13/2012 01:20   Final    Colony Count NO GROWTH   Final    Culture NO GROWTH   Final      Report Status 11/14/2012 FINAL   Final   URINE CULTURE     Status: Normal   Collection Time   11/13/12  5:08 AM      Component Value Range Status Comment   Specimen Description URINE, CATHETERIZED   Final    Special Requests NONE   Final    Culture  Setup Time 11/13/2012 09:02   Final    Colony Count NO GROWTH   Final    Culture NO GROWTH   Final    Report Status 11/14/2012 FINAL   Final   CULTURE, BLOOD (ROUTINE X 2)     Status: Normal (Preliminary result)   Collection Time   11/13/12  5:40 AM      Component Value Range Status Comment   Specimen Description BLOOD LEFT HAND   Final    Special Requests BOTTLES DRAWN AEROBIC ONLY 10CC   Final    Culture  Setup Time 11/13/2012 15:30   Final    Culture     Final    Value:        BLOOD CULTURE RECEIVED NO GROWTH TO DATE CULTURE WILL BE HELD FOR 5 DAYS BEFORE ISSUING A FINAL NEGATIVE REPORT   Report Status PENDING   Incomplete   CULTURE, RESPIRATORY     Status: Normal   Collection Time   11/13/12  6:22 AM      Component Value Range Status Comment   Specimen Description TRACHEAL ASPIRATE   Final    Special Requests NONE   Final    Gram Stain     Final    Value: MODERATE WBC PRESENT,BOTH PMN AND MONONUCLEAR     FEW SQUAMOUS EPITHELIAL CELLS PRESENT     RARE GRAM POSITIVE COCCI IN PAIRS   Culture Non-Pathogenic Oropharyngeal-type Flora Isolated.   Final    Report Status 11/15/2012 FINAL   Final   CULTURE, BLOOD (ROUTINE X 2)     Status: Normal (Preliminary result)   Collection Time   11/13/12  8:15 AM      Component Value Range Status Comment   Specimen Description BLOOD RIGHT ARM   Final    Special Requests BOTTLES DRAWN AEROBIC AND ANAEROBIC  10CC   Final    Culture  Setup Time 11/13/2012 15:30   Final    Culture     Final    Value:        BLOOD CULTURE RECEIVED NO GROWTH TO DATE CULTURE WILL BE HELD FOR 5 DAYS BEFORE ISSUING A FINAL NEGATIVE REPORT   Report Status PENDING   Incomplete   CULTURE, RESPIRATORY     Status: Normal  (Preliminary result)   Collection Time   11/16/12  1:14 AM      Component Value Range Status Comment   Specimen Description ENDOTRACHEAL ASPIRATE   Final    Special Requests NONE   Final    Gram Stain     Final    Value: ABUNDANT WBC PRESENT,BOTH PMN AND MONONUCLEAR     FEW SQUAMOUS EPITHELIAL CELLS PRESENT     RARE GRAM POSITIVE COCCI IN PAIRS   Culture Non-Pathogenic Oropharyngeal-type Flora Isolated.   Final    Report Status PENDING   Incomplete   URINE CULTURE     Status: Normal   Collection Time   11/16/12  1:14 AM      Component Value Range Status Comment   Specimen Description URINE, CLEAN CATCH   Final    Special Requests NONE   Final    Culture  Setup Time 11/16/2012 02:38   Final    Colony Count NO GROWTH   Final    Culture NO GROWTH   Final    Report Status 11/16/2012 FINAL   Final   RESPIRATORY VIRUS PANEL     Status: Normal   Collection Time   11/16/12  1:14 AM      Component Value Range Status Comment   Source - [facility] NASAL SWAB   Corrected CORRECTED ON 01/27 AT 1856: PREVIOUSLY REPORTED AS NASAL SWAB   Respiratory Syncytial Virus A NOT DETECTED   Final    Respiratory Syncytial Virus B NOT DETECTED   Final    Influenza A NOT DETECTED   Final    Influenza B NOT DETECTED   Final    Parainfluenza 1 NOT DETECTED   Final    Parainfluenza 2 NOT DETECTED   Final    Parainfluenza 3 NOT DETECTED   Final    Metapneumovirus NOT DETECTED   Final    Rhinovirus NOT DETECTED   Final    Adenovirus NOT DETECTED   Final    Influenza A H1 NOT DETECTED   Final    Influenza A H3 NOT DETECTED   Final   CULTURE, BLOOD (ROUTINE X 2)     Status: Normal (Preliminary result)   Collection Time   11/16/12  1:48 AM      Component Value Range Status Comment   Specimen Description BLOOD LEFT HAND   Final    Special Requests BOTTLES DRAWN AEROBIC ONLY 5CC   Final    Culture  Setup Time 11/16/2012 08:51   Final    Culture     Final    Value:        BLOOD CULTURE RECEIVED NO GROWTH TO DATE  CULTURE WILL BE HELD FOR 5 DAYS BEFORE ISSUING A FINAL NEGATIVE REPORT   Report Status PENDING   Incomplete   CULTURE, BLOOD (ROUTINE X 2)     Status: Normal (Preliminary result)   Collection Time   11/16/12  2:09 AM      Component Value Range Status Comment   Specimen Description BLOOD RIGHT ARM   Final  Special Requests BOTTLES DRAWN AEROBIC AND ANAEROBIC 10CC EACH   Final    Culture  Setup Time 11/16/2012 08:50   Final    Culture     Final    Value:        BLOOD CULTURE RECEIVED NO GROWTH TO DATE CULTURE WILL BE HELD FOR 5 DAYS BEFORE ISSUING A FINAL NEGATIVE REPORT   Report Status PENDING   Incomplete     Medical History: Past Medical History  Diagnosis Date  . HTN (hypertension)   . Hyperlipemia   . GERD (gastroesophageal reflux disease)   . Obesity   . Sleep apnea     she has not been on CPAP  . PE (pulmonary embolism) 05/2011  . DOE (dyspnea on exertion) 01/22/2012  . Hematuria 01/22/2012    Medications:  Scheduled:     . albuterol-ipratropium  6-8 puff Inhalation Q6H  . [COMPLETED] amiodarone (NEXTERONE PREMIX) 360 mg/200 mL dextrose      . [COMPLETED] amiodarone  150 mg Intravenous Once  . antiseptic oral rinse  15 mL Mouth Rinse QID  . chlorhexidine  15 mL Mouth Rinse BID  . docusate  100 mg Oral BID  . feeding supplement (OSMOLITE 1.5 CAL)  1,000 mL Per Tube Q24H  . feeding supplement  60 mL Per Tube QID  . fluconazole (DIFLUCAN) IV  100 mg Intravenous Q24H  . furosemide  40 mg Intravenous Q6H  . hydrocortisone sod succinate (SOLU-CORTEF) injection  50 mg Intravenous Q6H  . insulin aspart  2-6 Units Subcutaneous Q4H  . multivitamin  5 mL Per Tube Daily  . piperacillin-tazobactam (ZOSYN)  IV  3.375 g Intravenous Q8H  . [COMPLETED] potassium chloride  40 mEq Per Tube Once  . ranitidine  150 mg Per Tube BID  . vancomycin  1,250 mg Intravenous Q12H  . [COMPLETED] warfarin  2.5 mg Oral ONCE-1800  . [COMPLETED] warfarin  2.5 mg Oral ONCE-1800  . Warfarin -  Pharmacist Dosing Inpatient   Does not apply q1800   Assessment: 67 yo female admitted with acute on chronic respiratory failure and was intubated in the ED. Pharmacy to manage Zosyn and vancomycin for r/o HCAP.  Cultures negative to date.  Currently afebrile, Tm = 103.2  Goal of Therapy:  Vancomycin trough level 15-20 mcg/ml INR 2-3  Plan:  1.  Continue Vancomycin 1250 mg IV q 12 hrs for now. 2. Check vancomycin trough level with AM labs. 3. Continue Zosyn at current dosage. 4. Will follow-up culture results. 5. Monitor renal function.  Tad Moore, BCPS  Clinical Pharmacist Pager [phone]  11/17/2012 5:36 PM

## 2012-11-17 NOTE — Progress Notes (Signed)
11/17/12 0445  Pt started on Neo and titrated up to . Pt meeting MAP goal and has a HR of AFIB in the 80s-90s. When in pts room, pt HR converts to NSR and then Sinus Bradycardia to the low 40s. eLink and CCM rounding residents made aware. Pt has 500cc bolus ordered and CCM states they are okay with a HR of SB in the 50s. During this conversation pt does inform RN via communication board and nodding appropriately that pt may NOT want ETT tube removed on Wednesday. Rounding residents made aware. Pt is still a DNR. Another pt meeting may be needed, RN will informed AM shift RN.   Elisha Headland RN   Will continue to monitor

## 2012-11-17 NOTE — Progress Notes (Addendum)
11/17/12 0315  While giving pt a bath, pt goes into Afib with RVR. EKG ordered and confirms AFib with RVR. Pt staying in high 130s -140s. Pt titrated down to 4 from of Levophed. Pt on of Fentanyl. Paged CCM resident who ordered: EKG, pt pressors Neosynephrine, and to draw AM labs.   EKG read back to CCM resident and placed in chart.   Will continue to monitor.   Elisha Headland RN

## 2012-11-18 ENCOUNTER — Inpatient Hospital Stay
Admission: AD | Admit: 2012-11-18 | Discharge: 2012-12-09 | Disposition: A | Discharge status: Skilled Nursing Facility | Payer: Self-pay | Source: Ambulatory Visit | Attending: Internal Medicine | Admitting: Internal Medicine

## 2012-11-18 ENCOUNTER — Inpatient Hospital Stay (HOSPITAL_COMMUNITY): Payer: Medicare Other

## 2012-11-18 LAB — MAGNESIUM: Magnesium: 2.2 mg/dL (ref 1.5–2.5)

## 2012-11-18 LAB — CULTURE, BLOOD (ROUTINE X 2)
Culture: NO GROWTH
Culture: NO GROWTH

## 2012-11-18 LAB — BASIC METABOLIC PANEL
BUN: 90 mg/dL — ABNORMAL HIGH (ref 6–23)
Chloride: 104 mEq/L (ref 96–112)
GFR calc non Af Amer: 57 mL/min — ABNORMAL LOW (ref >90)
Glucose, Bld: 140 mg/dL — ABNORMAL HIGH (ref 70–99)
Potassium: 3.3 mEq/L — ABNORMAL LOW (ref 3.5–5.1)

## 2012-11-18 LAB — CBC
HCT: 42.9 % (ref 36.0–46.0)
Hemoglobin: 13.5 g/dL (ref 12.0–15.0)
MCH: 30.7 pg (ref 26.0–34.0)
MCHC: 31.5 g/dL (ref 30.0–36.0)

## 2012-11-18 LAB — GLUCOSE, CAPILLARY
Glucose-Capillary: 108 mg/dL — ABNORMAL HIGH (ref 70–99)
Glucose-Capillary: 125 mg/dL — ABNORMAL HIGH (ref 70–99)
Glucose-Capillary: 134 mg/dL — ABNORMAL HIGH (ref 70–99)

## 2012-11-18 LAB — PHOSPHORUS: Phosphorus: 2.8 mg/dL (ref 2.3–4.6)

## 2012-11-18 MED ORDER — PANTOPRAZOLE SODIUM 40 MG IV SOLR: 40.0000 mg | INTRAVENOUS | Status: DC

## 2012-11-18 MED ORDER — IPRATROPIUM-ALBUTEROL 18-103 MCG/ACT IN AERO: 2.0000 | INHALATION_SPRAY | Freq: Four times a day (QID) | RESPIRATORY_TRACT | Status: DC

## 2012-11-18 MED ORDER — POTASSIUM CHLORIDE 20 MEQ/15ML (10%) PO LIQD: 40.0000 meq | Freq: Two times a day (BID) | ORAL | Status: DC

## 2012-11-18 MED ORDER — DEXTROSE 5 % IV SOLN: 1.0000 g | INTRAVENOUS | Status: AC

## 2012-11-18 MED ORDER — DEXTROSE 5 % IV SOLN
1.0000 g | INTRAVENOUS | Status: DC
  Administered 2012-11-18: 1 g via INTRAVENOUS
  Filled 2012-11-18: qty 10

## 2012-11-18 MED ORDER — POTASSIUM CHLORIDE 20 MEQ/15ML (10%) PO LIQD
40.0000 meq | Freq: Two times a day (BID) | ORAL | Status: DC
  Administered 2012-11-18: 40 meq
  Filled 2012-11-18 (×2): qty 30

## 2012-11-18 MED ORDER — HYDROCORTISONE SOD SUCCINATE 100 MG IJ SOLR: 50.0000 mg | Freq: Four times a day (QID) | INTRAMUSCULAR | Status: DC

## 2012-11-18 MED ORDER — DOCUSATE SODIUM 50 MG/5ML PO LIQD: 100.0000 mg | Freq: Two times a day (BID) | ORAL | Status: DC

## 2012-11-18 MED ORDER — AMIODARONE HCL IN DEXTROSE 360-4.14 MG/200ML-% IV SOLN: 30.0000 mg/h | INTRAVENOUS | Status: DC

## 2012-11-18 MED ORDER — DEXTROSE 5 % IV SOLN
INTRAVENOUS | Status: DC
  Administered 2012-11-18: 12:00:00 via INTRAVENOUS

## 2012-11-18 MED ORDER — FUROSEMIDE 10 MG/ML IJ SOLN: 40.0000 mg | Freq: Every day | INTRAMUSCULAR | Status: DC

## 2012-11-18 MED ORDER — FREE WATER
200.0000 mL | Freq: Three times a day (TID) | Status: DC
Start: 2012-11-18 — End: 2012-11-18
  Administered 2012-11-18: 200 mL

## 2012-11-18 MED ORDER — VANCOMYCIN HCL IN DEXTROSE 1-5 GM/200ML-% IV SOLN: 1000.0000 mg | INTRAVENOUS | Status: DC

## 2012-11-18 MED ORDER — DEXTROSE 5 % IV SOLN
INTRAVENOUS | Status: DC
  Administered 2012-11-18: 06:00:00 via INTRAVENOUS

## 2012-11-18 MED ORDER — FLUCONAZOLE 100MG IVPB: 100.0000 mg | INTRAVENOUS | Status: AC

## 2012-11-18 MED ORDER — INSULIN ASPART 100 UNIT/ML ~~LOC~~ SOLN: 2.0000 [IU] | SUBCUTANEOUS | Status: DC

## 2012-11-18 MED ORDER — METOPROLOL TARTRATE 1 MG/ML IV SOLN: 5.0000 mg | Freq: Four times a day (QID) | INTRAVENOUS | Status: DC | PRN

## 2012-11-18 MED ORDER — FUROSEMIDE 10 MG/ML IJ SOLN
40.0000 mg | Freq: Four times a day (QID) | INTRAMUSCULAR | Status: DC
  Administered 2012-11-18: 40 mg via INTRAVENOUS
  Filled 2012-11-18: qty 4

## 2012-11-18 NOTE — Progress Notes (Signed)
ANTICOAGULATION CONSULT NOTE - Follow Up Consult  Pharmacy Consult for Warfarin Indication: Hx PE  No Known Allergies  Patient Measurements: Height: 5\' 5"  (165.1 cm) Weight: 272 lb 7.8 oz (123.6 kg) IBW/kg (Calculated) : 57   Vital Signs: Temp: 98.2 F (36.8 C) (01/29 0850) Temp src: Oral (01/29 0850) BP: 113/64 mmHg (01/29 0800) Pulse Rate: 63  (01/29 0800)  Labs:  Basename 11/18/12 0410 11/17/12 1814 11/17/12 0346 11/16/12 0330  HGB 13.5 -- 14.0 --  HCT 42.9 -- 42.9 43.1  PLT 207 -- 210 234  APTT -- -- -- --  LABPROT 27.4* -- 23.4* 23.0*  INR 2.71* -- 2.19* 2.14*  HEPARINUNFRC -- -- -- --  CREATININE 1.00 0.88 1.04 --  CKTOTAL -- -- -- --  CKMB -- -- -- --  TROPONINI -- -- -- --    Estimated Creatinine Clearance: 71.1 ml/min (by C-G formula based on Cr of 1).   Assessment: Patient is a 59 yoF on chronic warfarin therapy for history of PE. During recent admission (1/13-1/20), pt appeared sensitive to warfarin requiring much lower doses than her home regimen. Admitted on 1/23 with supratherapeutic INR of 4.18. Warfarin held until INR fell to therapeutic range then restarted at low dose of 2.5mg .   INR 2.71 today and trending up (2.14>2.19>2.71)   Potential interaction with fluconazole and amiodarone which may cause rise in INR.   CBC remains stable and no reports of bleeding per RN.   Will continue to be conservative with warfarin dosing d/t history  Goal of Therapy:  INR 2-3   Plan:  1. Hold warfarin today. 2. Monitor for any signs/symptoms of bleeding and CBC. 3. Follow up with INR in the a.m.   Al Decant, PharmD Candidate 11/18/2012  9:39 AM  I have reviewed the patient's clinical status and I agree with above assessment and plan. Agree with holding dose today due to significant rise in INR from 1/28 to 1/29. Will follow-up INR in am with plans to resume.  Link Snuffer, PharmD, BCPS Clinical Pharmacist [phone] 11/18/2012, 9:39 AM

## 2012-11-18 NOTE — Progress Notes (Signed)
Chaplain responded to request for prayer and presence at time of removing ventilator.  Pastoral conversation, prayer at bedside at time requested.  Removal delayed...will pass request on to next shift for continued pastoral presence.  Rev. Audie Box [phone]

## 2012-11-18 NOTE — Progress Notes (Signed)
Report called to [facility], RN at [facility]. Patient is alert and oriented, no complaints of pain, and in no respiratory distress after extubation. She is currently on 4 L [place]. Family member is at the bedside, will transfer when bed ready.

## 2012-11-18 NOTE — Progress Notes (Signed)
CRITICAL VALUE ALERT  Critical value received:  Vanc trough 35.1  Date of notification:  1/29  Time of notification:  0450  Critical value read back:yes  Nurse who received alert:  A. Canary Brim RN  MD notified (1st page):    Time of first page:    MD notified (2nd page):  Time of second page:  Responding MD:  Pharmacist Johnathan  Time MD responded:  [phone]  Hold next dose of vanc

## 2012-11-18 NOTE — Progress Notes (Signed)
PULMONARY  / CRITICAL CARE MEDICINE   Name: Kathleen [name] MRN: [id number] DOB: Jul 05, 1946    LOS: 6  REFERRING MD : [facility]  CHIEF COMPLAINT:  Hypercapnic respiratory failure, Shock  BRIEF PATIENT DESCRIPTION:  67 yo with history of PE (therapeutic INR on Coumadin), OSA (noncompliant with BiPAP), on home oxygen and chronic opioids recently discharged to SNF from [facility] after hypercapnic respiratory failure, brought to ED 1/24 after developing respiratory failure and AMS after refusing to wear her CPAP while at the nursing facility. She is DNR/DNI with plans for extubation today.   LINES / TUBES: Foley 1/24 >>> Left Subclavian 1/24 >>>  CULTURES: 1/23 - Urine Cx >> Neg 1/23 - Blood Cx x2 >> 1/24 - Blood culture x 2 >> 1/24 - Urine culture >> Neg 1/24 - Respiratory culture >> Oral flora 1/27 - Blood Cx >> 1/27 - Urine Cx >> Neg 1/27 - Resp Cx >> 1/27 - Resp viral panel >> Neg  ANTIBIOTICS: Vancomycin 1/24 >> Zosyn 1/24 >> Diflucan 1/27 >>  SIGNIFICANT EVENTS:  1/13 - Admitted for cellulitis, transferred to ICU for hypercarbic respiratory failure 1/14 - Remains intubated, hypercarbia improving. SW consulted to help start search for SNF. 1/17 - Extubated. 1/20 - discharged to SNF =========== 1/24 Admitted from SNF >> [facility] >> [facility], intubated 1/26 - Pt anxious overnight, wants ETT out  SUBJECTIVE/ INTERVAL HISTORY: 1/27 - Febrile overnight, pancultured, neg  2.7 liters last 24 hrs; pt to be DNR/DNI after extubation 1/28 - Remains afebrile, still agreeable to ETT removal 1/29  VITAL SIGNS: Temp:  [97.8 F (36.6 C)-99.1 F (37.3 C)] 98.4 F (36.9 C) (01/29 0359) Pulse Rate:  [61-132] 66  (01/29 0600) Resp:  [14-25] 16  (01/29 0600) BP: (77-116)/(45-83) 94/79 mmHg (01/29 0600) SpO2:  [93 %-99 %] 93 % (01/29 0600) FiO2 (%):  [30 %-40 %] 30 % (01/29 0420) Weight:  [272 lb 7.8 oz (123.6 kg)] 272 lb 7.8 oz (123.6 kg) (01/29 0440) HEMODYNAMICS: CVP:  [8 mmHg-11 mmHg] 9  mmHg VENTILATOR SETTINGS: Vent Mode:  [-] PRVC FiO2 (%):  [30 %-40 %] 30 % Set Rate:  [14 bmp-22 bmp] 14 bmp Vt Set:  [550 mL] 550 mL PEEP:  [5 cmH20] 5 cmH20 Pressure Support:  [5 cmH20-8 cmH20] 5 cmH20 Plateau Pressure:  [19 cmH20-26 cmH20] 21 cmH20  INTAKE / OUTPUT: Intake/Output      01/28 0701 - 01/29 0700   I.V. (mL/kg) 961.6 (7.8)   NG/GT 670   IV Piggyback 784   Total Intake(mL/kg) 2415.6 (19.5)   Urine (mL/kg/hr) 3875 (1.3)   Total Output 3875   Net -1459.4        PHYSICAL EXAMINATION: Gen: morbidly obese, chronically ill appearing HEENT: NCAT, ETT in place PULM: Breath sounds diminished at bases CV: Distant, heart sounds AB: Obese, BS+, soft, nontender Ext: Lower leg edema, feet cold, DP pulse present with doppler, non-palpable Neuro: Spontaneously opens eyes, follows commands, nods head in understanding Derm: Fungal dermatitis in folds around pannus, no frank cellulitis; small area of redness on sacrum  IMAGING:  Dg Chest Port 1 View  11/17/2012  *RADIOLOGY REPORT*  Clinical Data: Evaluate endotracheal tube placement, short of breath  PORTABLE CHEST - 1 VIEW  Comparison: Prior chest x-ray 11/16/2012  Findings: The tip of the endotracheal tube is 3.6 cm above the carina.  Unchanged position of left subclavian approach central venous catheter with the tip projecting over the confluence of the left brachiocephalic vein and superior  vena cava.  The nasogastric tube is incompletely imaged.  The tip lies below the diaphragm likely within the stomach.  Slightly increased pulmonary vascular congestion and bibasilar opacities.  Probable bilateral layering pleural effusions.  Cardiomegaly with prominent enlargement of the main pulmonary artery.  No acute osseous abnormality.  IMPRESSION:  1.  Slightly increased pulmonary vascular congestion now with mild interstitial edema. 2.  Similar left larger than right pleural effusions and associated bibasilar opacities. 3.  Cardiomegaly  with enlargement of the main pulmonary artery as can be seen with pulmonary arterial hypertension 4.  Stable and satisfactory support apparatus   Original Report Authenticated By: Malachy Moan, M.D.    Dg Chest Port 1 View  11/16/2012  *RADIOLOGY REPORT*  Clinical Data: Endotracheal tube placement  PORTABLE CHEST - 1 VIEW  Comparison: Yesterday  Findings: Endotracheal tube, NG tube, left subclavian central venous catheter are stable.  Stable diffuse edema.  Stable bilateral pleural effusions.  Low volumes.  IMPRESSION: Stable edema and pleural effusions.   Original Report Authenticated By: Jolaine Click, M.D.    EKG:  1/23 NSR, RBBB, TWI Ant precordial leads  DIAGNOSES: Principal Problem:  *Acute respiratory failure with hypercapnia Active Problems:  Personal history of pulmonary embolism  Acute encephalopathy  Morbid obesity  Obesity hypoventilation syndrome  Septic shock(785.52)  AKI (acute kidney injury)  HCAP (healthcare-associated pneumonia)   ASSESSMENT / PLAN:  PULMONARY  Lab 11/16/12 0420 11/15/12 0425 11/13/12 0542  PHART 7.467* 7.479* 7.511*  PCO2ART 47.6* 51.4* 41.5  PO2ART 88.5 88.8 229.0*  HCO3 34.0* 37.4* 33.0*  TCO2 35.4 38.9 34.3   A:  1) VDRF secondary to progressive chronic hypercarbic respiratory failure in setting of untreated OSA/OHS - was not using CPAP at nursing facility, so presumably worsening hypercapnea led to her admission/unresponsiveness  2) HCAP? 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  4) Suspected pulmonary hypertension - in setting of uncontrolled OSA due to non-compliance, diuresed, < -15L out this admission  P:   - wean cpap 5 ps 5, goal 1 hr, then likely extubation, unlikely to benefit further from vent  - q6h BD. - See ID. - Once extubated no intention to re-intubate -  CPAP mandatory qhs after extubation. - Continue for neg balance -pcxr residual int infiltrates, edema?? Vs obesity, chronic changes?  CARDIOVASCULAR  Lab 11/13/12 0540 11/12/12 2325  CKTOTAL -- --  CKMB -- --  TROPONINI <0.30 <0.30   A: 1) Shock, unclear etiology: volume depletion? Septic or cardiogenic? No ischemic changes and no clear source of infection Still requiring pressors, been getting Lasix 2/2 pulm edema, held 1/27 Lactic acid normal  Trop neg x2 Levo changed to Neo 2/2 Afib with RVR, unsustained 2) Chronic coumadin therapy 2/2 PE - INR supratherapeutic on admission  3) Meets criteria for rel AI, cortisol 14.2 on 24th P:  - Hold home Univasc/Metoprolol. - KVO IVF. - Neo as needed for MAP 50 and good neurostatus - Coumadin per pharmacy, restarted 1/27 - Stress steroids remain  RENAL  Lab 11/18/12 0410 11/17/12 1814 11/17/12 0346  NA 151* 148* 144  K 3.3* 3.1* 3.4*  CL 104 101 100  CO2 35* 36* 34*  BUN 90* 78* 83*  CREATININE 1.00 0.88 1.04  GLUCOSE 140* 177* 187*   A:  AKI on admission, uncertain etiology; presumably pre-renal (FeNa 0.1)  due to hypotension/sepsis Cr increase 2/2 diuresis, stable, concern approaching overdiuresis Hypokalemia Hypernatermia P:  - Lasix reduction  Plus d5w - Replacing electrolytes as needed - FWF @ 200 q8, dc when extubated - Foley - Monitor uop - KVO IVF  GASTROINTESTINAL  Lab 11/12/12 2325  AST 43*  ALT 27  ALKPHOS 69  BILITOT 0.2*  PROT 7.5  ALBUMIN 2.5*   A: No acute issues. P:  - Nutrition for TF, some issue residuals, do not hold TF unless res greater 400 - Zantac for PPx while intubated - Bedside swallow after extubation  HEMATOLOGIC  Lab 11/18/12 0410 11/17/12 0346 11/16/12 0330  WBC 13.3* 13.3* 9.4  HGB 13.5 14.0 14.6  HCT 42.9 42.9 43.1  PLT 207 210 234  APTT -- -- --  INR 2.71* 2.19* 2.14*    A:  1) Chronic coumadin in setting of chronic PE - supratherapeutic on admit.   P:  - Daily  PT/INR - Continue warfarin per pharmacy consult, goal INR 2-3, at goal  INFECTIOUS  Lab 11/18/12 0410 11/17/12 0346 11/16/12 0330 11/13/12 0540 11/12/12 2330 11/12/12 2325  WBC 13.3* 13.3* 9.4 -- -- --  NEUTROABS -- -- -- -- -- 4.7  LATICACIDVEN -- -- -- 1.4 1.5 --  PROCALCITON 0.21 0.28 -- 0.14 -- --   A:  1) SIRS/ Septic shock? Cx negative to date. Remains afebrile  Leukocytosis stable Still requiring pressors nosocomial P:  - F/u cultures - Vanc/zosyn, dc narrow to ceftriaxone, add stop date in am  -Diflucan for dermatitis fungal, dc at 7 days - Repeating UA, BC, sputum - no growth  ENDOCRINE  Lab 11/18/12 0352 11/18/12 0022 11/17/12 2028 11/17/12 1549 11/17/12 1152  GLUCAP 116* 134* 141* 150* 159*   A:  1) Prediabetes - A1c 6.3 2) meets criteria Rel AI P:  - ICU hyperglycemia protocol - Will need diabetes RN to provide education after extubation - Stress dose steroids, maintain  NEUROLOGIC / PSYCHIATRIC  A:  1) Acute encephalopathy secondary to acute on chronic hypercarbic respiratory failure - following commands after intubation 2) Anxiety - has baseline anxiety on home PRN BZD.  P:  - Fent drip - Fentanyl and versed as needed for sedation - RASS -1, goal 0  GLOBAL: A: Social situation: She went to a SNF but was non-compliant with CPAP.  Apparently she tried to change her HCPOA and change her will to reflect limited life support (no longer than a week), however this was never made official. Discussed code status with pt multiple times, as she has been A&Ox4. Will extubate on today, 1/29; after extubation, pt will remain DNR/DNI.   CLINICAL SUMMARY:  Afebrile, now with leukocytosis, weaning, lower lasix, pt to be extubated on 1/29 and will remain DNR/DNI.  CC time by me 30 minutes.  Mcarthur Rossetti. Tyson Alias, MD, FACP Pgr: [phone] [facility]

## 2012-11-18 NOTE — Progress Notes (Signed)
Chaplain Note: Chaplain visited with pt who was awake, and resting in bed.  Pt had recently been extubated.  Chaplain provided spiritual comfort and support for pt.  Pt expressed appreciation for chaplain support.  Chaplain will follow up as needed.  11/13/12 1700  Clinical Encounter Type  Visited With Patient  Visit Type Spiritual support  Referral From Nurse  Spiritual Encounters  Spiritual Needs Emotional  Stress Factors  Patient Stress Factors Major life changes;Health changes  Family Stress Factors None identified (No family present)  Advance Directives (For Healthcare)  Advance Directive Patient does not have advance directive  Pre-existing out of facility DNR order (yellow form or pink MOST form) No   Verdie Shire, Chaplain  [phone]

## 2012-11-18 NOTE — Progress Notes (Signed)
CRITICAL CARE RESIDENT NOTE Interim Progress Note    RESULT INTERVENTION TAKEN  1. K 3.3  K 40 meQ x 2 per tube  2. Na 151  FW flushes 200 q 8, D5W 75 cc/hr x 12 hours   3.      Signed: Annett Gula, MD  PGY-1 Internal Medicine Resident Pager: [phone] (7PM-7AM) 11/18/2012, 5:26 AM

## 2012-11-18 NOTE — Progress Notes (Signed)
ANTIBIOTIC CONSULT NOTE  Pharmacy Consult for vancomycin Indication: rule out pneumonia/sepsis  No Known Allergies  Patient Measurements: Height: 5\' 5"  (165.1 cm) Weight: 272 lb 7.8 oz (123.6 kg) IBW/kg (Calculated) : 57   Vital Signs: Temp: 98.4 F (36.9 C) (01/29 0359) Temp src: Oral (01/29 0359) BP: 95/52 mmHg (01/29 0420) Pulse Rate: 67  (01/29 0420) Intake/Output from previous day: 01/28 0701 - 01/29 0700 In: 1941.1 [I.V.:829.6; NG/GT:590; IV Piggyback:521.5] Out: 3700 [Urine:3700] Intake/Output from this shift: Total I/O In: 496 [I.V.:318.5; NG/GT:140; IV Piggyback:37.5] Out: 1600 [Urine:1600]  Labs:  [facility] 11/18/12 0410 11/17/12 1814 11/17/12 0346 11/16/12 0330  WBC 13.3* -- 13.3* 9.4  HGB 13.5 -- 14.0 14.6  PLT 207 -- 210 234  LABCREA -- -- -- --  CREATININE 1.00 0.88 1.04 --   Estimated Creatinine Clearance: 71.1 ml/min (by C-G formula based on Cr of 1).  Basename 11/18/12 0410  VANCOTROUGH 35.1*  VANCOPEAK --  Drue Dun --  GENTTROUGH --  GENTPEAK --  GENTRANDOM --  TOBRATROUGH --  TOBRAPEAK --  TOBRARND --  AMIKACINPEAK --  AMIKACINTROU --  AMIKACIN --    Assessment: 67 yo female with VDRF/HCAP for vancomycin  Goal of Therapy:  Vancomycin trough level 15-20 mcg/ml  Plan:  Change vancomycin 1 g IV q24h  Geannie Risen, PharmD, BCPS  11/18/2012 5:14 AM

## 2012-11-18 NOTE — Discharge Summary (Signed)
Internal Medicine Teaching [facility] Discharge Note  Name: Kathleen [name] MRN: [id number] DOB: Jun 21, 1946 67 y.o.  Date of Admission: 11/12/2012 11:07 PM Date of Discharge: 11/18/2012 Attending Physician: Coralyn Helling, MD  Discharge Diagnosis: Principal Problem:  *Acute respiratory failure with hypercapnia Active Problems:  Personal history of pulmonary embolism  Acute encephalopathy  Morbid obesity  Obesity hypoventilation syndrome  Septic shock(785.52)  AKI (acute kidney injury)  HCAP (healthcare-associated pneumonia)   Discharge Medications:   Medication List     As of 11/18/2012 10:50 AM    ASK your doctor about these medications         atorvastatin 40 MG tablet   Commonly known as: LIPITOR   Take 40 mg by mouth at bedtime.      cholecalciferol 1000 UNITS tablet   Commonly known as: VITAMIN D   Take 4,000 Units by mouth daily.      ezetimibe 10 MG tablet   Commonly known as: ZETIA   Take 10 mg by mouth at bedtime.      LORazepam 0.5 MG tablet   Commonly known as: ATIVAN   Take 0.5 mg by mouth every 12 (twelve) hours as needed. For anxiety      metoprolol succinate 50 MG 24 hr tablet   Commonly known as: TOPROL-XL   Take 50 mg by mouth daily. Take with or immediately following a meal.      moexipril 15 MG tablet   Commonly known as: UNIVASC   Take 15 mg by mouth daily.      omeprazole 20 MG capsule   Commonly known as: PRILOSEC   Take 20 mg by mouth daily.      warfarin 5 MG tablet   Commonly known as: COUMADIN   Take 5 mg by mouth See admin instructions. 5mg  daily Except 7.5mg  Tuesdays and Thursdays only        Disposition and follow-up:   Kathleen [name] was discharged from [facility] in stable condition to [facility].  She will likely need a sleep study as an outpatient for her OSA/OHS. Please see below for hospital course and medication needs.  Consultations:  None  Procedures Performed:  Ct Head Wo Contrast  11/02/2012   *RADIOLOGY REPORT*  Clinical Data:  Fall with trauma to the head and neck.  Unable to get up.  Weakness.  CT HEAD WITHOUT CONTRAST CT CERVICAL SPINE WITHOUT CONTRAST  Technique:  Multidetector CT imaging of the head and cervical spine was performed following the standard protocol without intravenous contrast.  Multiplanar CT image reconstructions of the cervical spine were also generated.  Comparison:   None  CT HEAD  Findings: There are areas of low density in the hemispheric white matter bilaterally most consistent with chronic small vessel disease.  No sign of acute infarction, mass lesion, hemorrhage, hydrocephalus or extra-axial collection.  No skull fracture.  No traumatic fluid in the sinuses.  There is some mucosal thickening within the sinuses. There is atherosclerotic calcification of the major vessels at the base of the brain.  IMPRESSION: No acute appearing finding.  Chronic small vessel changes within the hemispheric white matter.  No traumatic finding.  CT CERVICAL SPINE  Findings: No fracture or traumatic malalignment.  There is degenerative spondylosis at C3-4, C5-6, C6-7 and C7-T1.  No severe encroachment upon the canal.  There is some foraminal narrowing in the region because of osteophytic encroachment.  Lesser degenerative changes are also noted at C4-5.  The facets appear to  be fused at the C2-3 level.  IMPRESSION: No acute or traumatic finding.  Fusion at the C2-3 level.  Adjacent segment degenerative spondylosis at C3-4 as well as at C5-6, C6-7 and C7-T1.   Original Report Authenticated By: Paulina Fusi, M.D.    Ct Chest W Contrast  11/03/2012  *RADIOLOGY REPORT*  Clinical Data: Widened mediastinum.  Question mass.  CT CHEST WITH CONTRAST  Technique:  Multidetector CT imaging of the chest was performed following the standard protocol during bolus administration of intravenous contrast.  Contrast: 75mL OMNIPAQUE IOHEXOL 300 MG/ML  SOLN  Comparison: Chest x-ray 11/03/2012.  CT of the chest  01/30/2012.  Findings: The endotracheal tube terminates 4 cm above the carina. A left IJ line is in place.  The heart size is normal.  The upper mediastinum is unremarkable.  Moderate bilateral pleural effusions are present.  Bilateral dependent airspace disease is noted as well.  This likely reflects atelectasis.  Early infection is not excluded at the bases.  No other focal nodule, mass, or airspace disease is present.  The bone windows demonstrate degenerative changes of the thoracic spine.  A chronic T4 compression fracture is noted.  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  IMPRESSION:  1.  Moderate bilateral pleural effusions. 2.  Associated dependent airspace disease.  While this likely reflects atelectasis, infection is not excluded. 3.  Chronic superior endplate compression fracture of T4. 4.  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   Original Report Authenticated By: Marin Roberts, M.D.    Ct Cervical Spine Wo Contrast  11/02/2012  *RADIOLOGY REPORT*  Clinical Data:  Fall with trauma to the head and neck.  Unable to get up.  Weakness.  CT HEAD WITHOUT CONTRAST CT CERVICAL SPINE WITHOUT CONTRAST  Technique:  Multidetector CT imaging of the head and cervical spine was performed following the standard protocol without intravenous contrast.  Multiplanar CT image reconstructions of the cervical spine were also generated.  Comparison:   None  CT HEAD  Findings: There are areas of low density in the hemispheric white matter bilaterally most consistent with chronic small vessel disease.  No sign of acute infarction, mass lesion, hemorrhage, hydrocephalus or extra-axial collection.  No skull fracture.  No traumatic fluid in the sinuses.  There is some mucosal thickening within the sinuses. There is atherosclerotic calcification of the major vessels at the base of the brain.  IMPRESSION: No acute appearing finding.  Chronic small vessel changes within the hemispheric  white matter.  No traumatic finding.  CT CERVICAL SPINE  Findings: No fracture or traumatic malalignment.  There is degenerative spondylosis at C3-4, C5-6, C6-7 and C7-T1.  No severe encroachment upon the canal.  There is some foraminal narrowing in the region because of osteophytic encroachment.  Lesser degenerative changes are also noted at C4-5.  The facets appear to be fused at the C2-3 level.  IMPRESSION: No acute or traumatic finding.  Fusion at the C2-3 level.  Adjacent segment degenerative spondylosis at C3-4 as well as at C5-6, C6-7 and C7-T1.   Original Report Authenticated By: Paulina Fusi, M.D.    Dg Chest Port 1 View  11/18/2012  *RADIOLOGY REPORT*  Clinical Data: Respiratory failure, evaluate endotracheal tube positioning  PORTABLE CHEST - 1 VIEW  Comparison: 11/17/2012; 11/16/2012; 11/15/2012  Findings: Grossly unchanged enlarged cardiac silhouette and mediastinal contours.  Stable position of support apparatus.  No definite pneumothorax.  The bony vasculature remains indistinct, particularly within the right lung.  Grossly  unchanged small bilateral effusions and associated bilateral mid and lower lung heterogeneous air space opacities.  Unchanged bones.  IMPRESSION: 1.  Stable positioning of support apparatus.  No pneumothorax. 2.  Grossly unchanged findings of asymmetric pulmonary edema, small bilateral effusions and bilateral mid and lower lung opacities, atelectasis versus infiltrate.   Original Report Authenticated By: Tacey Ruiz, MD    Dg Chest Port 1 View  11/17/2012  *RADIOLOGY REPORT*  Clinical Data: Evaluate endotracheal tube placement, short of breath  PORTABLE CHEST - 1 VIEW  Comparison: Prior chest x-ray 11/16/2012  Findings: The tip of the endotracheal tube is 3.6 cm above the carina.  Unchanged position of left subclavian approach central venous catheter with the tip projecting over the confluence of the left brachiocephalic vein and superior vena cava.  The nasogastric tube is  incompletely imaged.  The tip lies below the diaphragm likely within the stomach.  Slightly increased pulmonary vascular congestion and bibasilar opacities.  Probable bilateral layering pleural effusions.  Cardiomegaly with prominent enlargement of the main pulmonary artery.  No acute osseous abnormality.  IMPRESSION:  1.  Slightly increased pulmonary vascular congestion now with mild interstitial edema. 2.  Similar left larger than right pleural effusions and associated bibasilar opacities. 3.  Cardiomegaly with enlargement of the main pulmonary artery as can be seen with pulmonary arterial hypertension 4.  Stable and satisfactory support apparatus   Original Report Authenticated By: Malachy Moan, M.D.    Dg Chest Port 1 View  11/16/2012  *RADIOLOGY REPORT*  Clinical Data: Endotracheal tube placement  PORTABLE CHEST - 1 VIEW  Comparison: Yesterday  Findings: Endotracheal tube, NG tube, left subclavian central venous catheter are stable.  Stable diffuse edema.  Stable bilateral pleural effusions.  Low volumes.  IMPRESSION: Stable edema and pleural effusions.   Original Report Authenticated By: Jolaine Click, M.D.    Dg Chest Port 1 View  11/15/2012  *RADIOLOGY REPORT*  Clinical Data: Endotracheal tube placement  PORTABLE CHEST - 1 VIEW  Comparison: 11/14/2012  Findings: Cardiomediastinal silhouette is stable.  Central mild vascular congestion without convincing pulmonary edema. Improvement in aeration upper lobes.  Stable endotracheal and NG tube position.  Stable left subclavian central line position.  Mild right basilar atelectasis.  Small left pleural effusion with left basilar atelectasis or infiltrate.  IMPRESSION: Central mild vascular congestion without convincing pulmonary edema.  Improvement in aeration upper lobes.  Stable endotracheal and NG tube position.  Stable left subclavian central line position.  Mild right basilar atelectasis.  Small left pleural effusion with left basilar atelectasis or  infiltrate.   Original Report Authenticated By: Natasha Mead, M.D.    Portable Chest Xray In Am  11/14/2012  *RADIOLOGY REPORT*  Clinical Data: Ventilator dependent respiratory failure.  PORTABLE CHEST - 1 VIEW 11/14/2012 0621 hours:  Comparison: Portable chest x-ray yesterday dating back to 11/05/2012.  Findings: Endotracheal tube tip in satisfactory position projecting approximately 3 cm above the carina.  Left subclavian central venous catheter tip projects over the left innominate vein and has withdrawn slightly.  Cardiac silhouette enlarged but stable. Interval worsening of now moderate diffuse interstitial and airspace pulmonary edema.  Enlarging bilateral pleural effusions with associated passive atelectasis in the lower lobes, left greater than right.  Nasogastric tube courses below the diaphragm into the stomach.  IMPRESSION: Support apparatus satisfactory.  Interval worsening of now moderate interstitial and airspace pulmonary edema and bilateral pleural effusions since yesterday.  Associated passive atelectasis in the lower lobes, left greater than right.  Original Report Authenticated By: Hulan Saas, M.D.    Dg Chest Port 1 View  11/13/2012  *RADIOLOGY REPORT*  Clinical Data: Central line placement.  PORTABLE CHEST - 1 VIEW  Comparison: Chest radiograph performed 11/12/2012  Findings: The patient's endotracheal tube is seen ending 4 cm above the carina.  A left subclavian line is noted ending about the proximal to mid SVC.  The lungs are relatively well expanded.  Vascular congestion is noted, with patchy bilateral airspace opacities, particularly on the left.  This likely reflects persistent pulmonary edema.  A small left pleural effusion is noted.  No pneumothorax is seen.  The cardiomediastinal silhouette is borderline normal in size.  No acute osseous abnormalities are seen.  IMPRESSION:  1.  Endotracheal tube seen ending 4 cm above the carina. 2.  Left subclavian line noted ending about the  proximal to mid SVC. 3.  Vascular congestion, with patchy bilateral airspace opacities, worse on the left.  This likely reflects persistent pulmonary edema.  Small left pleural effusion seen.   Original Report Authenticated By: Tonia Ghent, M.D.    Dg Chest Portable 1 View  11/12/2012  *RADIOLOGY REPORT*  Clinical Data: Endotracheal tube placement.  Respiratory failure.  PORTABLE CHEST - 1 VIEW  Comparison: Chest radiograph performed 11/06/2012  Findings: The patient's endotracheal tube is seen ending 3-4 cm above the carina.  The lungs are slightly hypoexpanded.  Vascular congestion is noted, with bilateral central and left basilar airspace opacification, most compatible with pulmonary edema.  Small bilateral pleural effusions are likely present.  No pneumothorax is seen.  The cardiomediastinal silhouette is borderline normal in size.  No acute osseous abnormalities are identified.  IMPRESSION:  1.  Endotracheal tube seen ending 3-4 cm above the carina. 2.  Lungs slightly hypoexpanded.  Vascular congestion, with bilateral central and left basilar airspace opacification, most compatible with significantly worsening pulmonary edema.  Small bilateral pleural effusions likely present.   Original Report Authenticated By: Tonia Ghent, M.D.    Dg Chest Port 1 View  11/06/2012  *RADIOLOGY REPORT*  Clinical Data: Intubated.  PORTABLE CHEST - 1 VIEW  Comparison: 11/05/2012  Findings: Endotracheal tube tip measures about 5.5 cm above the carina.  Enteric tube tip is not visualized but is below the left hemidiaphragm consistent with location and/or distal to the stomach.  A left central venous catheter overlies the mid mediastinum consistent with location in the origin of the brachial cephalic vein.  Again demonstrated, there is diffuse cardiac enlargement with pulmonary vascular congestion and perihilar edema. The perihilar infiltrates are mildly improved since previous study.  IMPRESSION: Appliances positioned as  described.  Cardiac enlargement with pulmonary vascular congestion and perihilar edema.  Edema pattern is improving since previous study.   Original Report Authenticated By: Burman Nieves, M.D.    Dg Chest Port 1 View  11/05/2012  *RADIOLOGY REPORT*  Clinical Data: Follow-up ventilated patient.  PORTABLE CHEST - 1 VIEW  Comparison: One-view chest 11/04/2012.  Findings: The heart is enlarged.  The patient remains intubated.  A left IJ line is stable.  An NG tube has been placed.  There is interval increase in diffuse interstitial edema.  Bibasilar airspace disease has progressed, left greater than right.  Pleural effusions are suspected, particularly on the left.  IMPRESSION:  1.  Interval increase in diffuse interstitial edema. 2.  Increasing bibasilar airspace disease.  In the face of increased edema, this likely represents worsening atelectasis. Infection is not excluded. 3.  Suspect bilateral pleural effusions,  worse on the left. 4.  The support apparatus is stable.   Original Report Authenticated By: Marin Roberts, M.D.    Dg Chest Port 1 View  11/04/2012  *RADIOLOGY REPORT*  Clinical Data: Pulmonary infiltrates and effusions.  PORTABLE CHEST - 1 VIEW  Comparison: 11/03/2012  Findings: Endotracheal tube and central line appear in good position. The bilateral effusions appear diminished slightly. Persistent atelectasis at the left lung base medially.  Heart size and pulmonary vascularity are normal.  IMPRESSION: Slight decrease in the bilateral effusions.  Pulmonary vascularity is now normal.   Original Report Authenticated By: Francene Boyers, M.D.    Dg Chest Port 1 View  11/03/2012  *RADIOLOGY REPORT*  Clinical Data: Endotracheal tube and central line placement.  PORTABLE CHEST - 1 VIEW  Comparison: 11/02/2012  Findings: Interval placement of endotracheal tube with tip about 4.4 cm above the carina.  A left central venous catheter was placed with tip projected over the mid right mediastinum  consistent with location at the origin of the brachial cephalic vein.  No pneumothorax.  Cardiac enlargement.  Bilateral perihilar airspace disease is unchanged.  IMPRESSION: Interval placement of endotracheal tube and central venous catheter.  No complication.  Otherwise no change in bilateral perihilar airspace disease.   Original Report Authenticated By: Burman Nieves, M.D.    Dg Chest Port 1 View  11/02/2012  *RADIOLOGY REPORT*  Clinical Data: Short of breath  PORTABLE CHEST - 1 VIEW  Comparison: 06/18/2011  Findings: Bilateral central basilar airspace disease.  Prominent left hilar soft tissue density with streaky opacities extending into the left upper lobe.  Bilateral pleural effusions are suspected.  Cardiomegaly.  IMPRESSION: Bilateral airspace disease in a pulmonary edema pattern.  Findings are worrisome for a central left upper lobe mass. Upright PA and lateral chest radiographic study is recommended.   Original Report Authenticated By: Jolaine Click, M.D.    Dg Shoulder Left  11/02/2012  *RADIOLOGY REPORT*  Clinical Data: Fall with shoulder pain.  LEFT SHOULDER - 2+ VIEW  Comparison: None.  Findings: No fracture or dislocation.  Degenerative changes are seen in the acromioclavicular joint.  Visualized portion of the left chest shows left apical pleural thickening.  IMPRESSION: Left acromioclavicular joint osteoarthritis.   Original Report Authenticated By: Leanna Battles, M.D.    Dg Knee Complete 4 Views Right  11/02/2012  *RADIOLOGY REPORT*  Clinical Data: Anterior right knee pain post fall, cellulitis right leg  RIGHT KNEE - COMPLETE 4+ VIEW  Comparison: None  Findings: Scattered soft tissue swelling particularly anteriorly and laterally at the right thigh and proximal right lower leg. Osseous demineralization. Joint spaces preserved. Bedding artifacts. Patellar spur at quadriceps tendon insertion. No acute fracture, dislocation, or bone destruction. Suspect small knee joint effusion.   IMPRESSION: No acute osseous abnormalities. Scattered soft tissue swelling.   Original Report Authenticated By: Ulyses Southward, M.D.    Admission: HPI: 67 yo with history of PE (therapeutic INR on Coumadin), OSA (noncompliant with BiPAP), on home oxygen and chronic opioids brought to ED 1/24 after developing respiratory failure, confusion, and hypotension at home. She was recently discharged to a SNF from [facility] after hypercapnic respiratory failure and cellulitis in the setting of a fall. Additionally, it was noted by her pastor (only contact available on admission) that she was becoming more somnolent and coughing more at her nursing facility the day prior to hospital admission (1/23). As the night wore on she became unresponsive and was brought to [facility] ED where she was intubated  without sedation. He was unaware of any fever or chills. However, he notes that she refused to wear her CPAP while at the nursing facility. He reports that she had been trying to change her HCPOA to him, but she never signed the paperwork. He has tried to contact her next of kin (a step-granddaughter), but has been unable.  PHYSICAL EXAMINATION:  Gen: morbidly obese, chronically ill appearing  HEENT: NCAT, PERRL, ETT in place  PULM: Insp wheeze R > L, breath sounds diminished on L  CV: Distant, barely audible  AB: BS+, soft, nontender  Ext: some ankle and lower leg edema, warm  Neuro: Sedated on vent, currently not following commands  Derm: fungal appearing erythema in folds around pannus, no frank cellulitis; small area of redness on sacrum   Hospital Course by system based problem list: PULMONARY   Lab  11/16/12 0420  11/15/12 0425  11/13/12 0542   PHART  7.467*  7.479*  7.511*   PCO2ART  47.6*  51.4*  41.5   PO2ART  88.5  88.8  229.0*   HCO3  34.0*  37.4*  33.0*   TCO2  35.4  38.9  34.3    1) VDRF secondary to progressive chronic hypercarbic respiratory failure in setting of untreated OSA/OHS - was not using CPAP at  nursing facility, so presumably worsening hypercapnea led to her admission/unresponsiveness. The vent was weaned, and she was extubated 1/29 and has been doing well on nasal canula. She has been receiving q6h bronchodilator therapy during this admission. 2) Possible HCAP - had increased cough, CXR with pulm edema and left pleural effusion. CXR with residual interstitial infiltrates, edema?? vs obesity, chronic changes? Pulmonary edema stable over the past 72hrs, Lasix 40 IV q6h was given to help improve pulmonary edema, changed to 40 IV daily on 1/29 due to increased Cr and BUN. 3) History of PE on chronic coumadin - CTA from 01/2012 showing residual right main artery chronic PE. INR supratheraputic on admission and was held until INR< 2.5. Coumadin was restarted 1/27 and she remains therapeutic. 4) Suspected pulmonary hypertension - in setting of uncontrolled OSA due to non-compliance, diuresed, < -15L this admission. She will require CPAP after discharge.  CARDIOVASCULAR   Lab  11/13/12 0540  11/12/12 2325   CKTOTAL  --  --   CKMB  --  --   TROPONINI  <0.30  <0.30    1) Shock, unclear etiology: volume depletion? Septic or cardiogenic? - No ischemic changes and no clear source of infection. Lactic acid has been normal and troponin neg x2. Requiring pressors (Levo changed to Neo 2/2 Afib with RVR, unsustained) until 1/29, but has been getting Lasix 2/2 pulm edema, held 1/27.  2) Chronic coumadin therapy 2/2 PE - INR supratherapeutic on admission, Coumadin restarted as noted above.  3) Meets criteria for relative adrenal insufficiency, cortisol 14.2 on 11/13/2012.  4) H/o HTN- on Univasc and Toprol-XL at home, held on admission. 5) Afib with RVR and SVT: Intermittent. Placed on Amiodarone drip on 1/28. Still with intermittent Afib, rate better controlled. Prior to d/c, pt is in normal sinus rhythm.  RENAL   Lab  11/18/12 0410  11/17/12 1814  11/17/12 0346   NA  151*  148*  144   K  3.3*  3.1*   3.4*   CL  104  101  100   CO2  35*  36*  34*   BUN  90*  78*  83*   CREATININE  1.00  0.88  1.04   GLUCOSE  140*  177*  187*    A:  1) AKI on admission: Uncertain etiology; presumably pre-renal (FeNa 0.1) due to hypotension/sepsis. Cr improved, but pt with excess fluid, diuresed with Lasix.  Cr increase 2/2 diuresis, stable, concern approaching over diuresis, so Lasix 40 IV q6 changed to qday 2) Hypokalemia, replaced electrolytes as needed  3) Hypernatermia, began free water flushes 1/28 which were changed to D5W after extubation. Lasix reduced as noted above.   GASTROINTESTINAL   Lab  11/12/12 2325   AST  43*   ALT  27   ALKPHOS  69   BILITOT  0.2*   PROT  7.5   ALBUMIN  2.5*    A: She was placed on TF, some residuals, but minimal. After extubation, she will require a swallow study and then a diabetic friendly diet as tolerated. Zantac was started for PPx while intubated which was changed to Protonix, as she is on therapy at home for GERD. Once she passes a swallow study, oral medications can be started.  HEMATOLOGIC   Lab  11/18/12 0410  11/17/12 0346  11/16/12 0330   WBC  13.3*  13.3*  9.4   HGB  13.5  14.0  14.6   HCT  42.9  42.9  43.1   PLT  207  210  234   APTT  --  --  --   INR  2.71*  2.19*  2.14*    A:  1) Chronic PE requiring chronic coumadin: INR supratherapeutic on admission. Once it was below 2.5, Coumadin was restarted, dosed by Pharmacy. Goal INR between 2-3.    INFECTIOUS   Lab  11/18/12 0410  11/17/12 0346  11/16/12 0330  11/13/12 0540  11/12/12 2330  11/12/12 2325   WBC  13.3*  13.3*  9.4  --  --  --   NEUTROABS  --  --  --  --  --  4.7   LATICACIDVEN  --  --  --  1.4  1.5  --   PROCALCITON  0.21  0.28  --  0.14  --  --    A:  1) SIRS/ Possibly Septic shock: She was febrile to 102 on admission, and was placed on Vanc and Zosyn. She remained afebrile until 11/16/12, when her maximum temperature was 103.2. Diflucan was started 2/2 to concerns that  her fungal dermatitis, present prior to admission, could have resulted in a systemic fungal infection. She was also re-cultured and given Tylenol, but has remained afebrile since 1/27.  All cultures have been negative to date, some still pending and will need to be followed up on. Prior to discharge, the Vanc/zosyn, were discontinued and her coverage was narrowed to ceftriaxone with plans to stop on 11/19/12, giving 7 days total coverage. The Diflucan was started on 1/27, and should be continued for a total of 7 days.    ENDOCRINE   Lab  11/18/12 0352  11/18/12 0022  11/17/12 2028  11/17/12 1549  11/17/12 1152   GLUCAP  116*  134*  141*  150*  159*    A:  1) Prediabetes:  A1c 6.3 on 11/02/12. She was started on the CIU hyperglycemia protocol, and was placed on sliding scale insulin. CBGs elevated but stable since admission. She will need Diabetes RN to provide education 2) Meets criteria Relative Adrenal Insufficiency:  Cortisol 14.2, and she was started on stress dose steroids, which should be tapered at the further she is  from her ICU stay.  NEUROLOGIC / PSYCHIATRIC  A:  1) Acute encephalopathy secondary to acute on chronic hypercarbic respiratory failure: Resolved. She was following commands after intubation, and was alert and oriented 72 to 48 hours prior to extubation.  2) Anxiety: She has baseline anxiety on home PRN benzodiazepine. While intubated, she was sedated with a fentanyl drip and versed PRN, which have been stopped. She has not required any further medication for anxiety this admission.    GLOBAL:  A:  Social situation: She went to a SNF but was non-compliant with CPAP. Apparently she tried to change her HCPOA and change her will to reflect limited life support (no longer than a week), however this was never made official. Discussed code status with pt multiple times, as she has been A&Ox4. She was extubated today, 11/18/12, and will remain DNR/DNI.    Discharge Vitals:  BP  106/54  Pulse 72  Temp 98.2 F (36.8 C) (Oral)  Resp 24  Ht 5\' 5"  (1.651 m)  Wt 272 lb 7.8 oz (123.6 kg)  BMI 45.34 kg/m2  SpO2 98%  Discharge Labs:  Results for orders placed during the hospital encounter of 11/12/12 (from the past 24 hour(s))  GLUCOSE, CAPILLARY     Status: Abnormal   Collection Time   11/17/12 11:52 AM      Component Value Range   Glucose-Capillary 159 (*) 70 - 99 mg/dL  GLUCOSE, CAPILLARY     Status: Abnormal   Collection Time   11/17/12  3:49 PM      Component Value Range   Glucose-Capillary 150 (*) 70 - 99 mg/dL  BASIC METABOLIC PANEL     Status: Abnormal   Collection Time   11/17/12  6:14 PM      Component Value Range   Sodium 148 (*) 135 - 145 mEq/L   Potassium 3.1 (*) 3.5 - 5.1 mEq/L   Chloride 101  96 - 112 mEq/L   CO2 36 (*) 19 - 32 mEq/L   Glucose, Bld 177 (*) 70 - 99 mg/dL   BUN 78 (*) 6 - 23 mg/dL   Creatinine, Ser 1.61  0.50 - 1.10 mg/dL   Calcium 9.3  8.4 - 09.6 mg/dL   GFR calc non Af Amer 66 (*) >90 mL/min   GFR calc Af Amer 76 (*) >90 mL/min  GLUCOSE, CAPILLARY     Status: Abnormal   Collection Time   11/17/12  8:28 PM      Component Value Range   Glucose-Capillary 141 (*) 70 - 99 mg/dL  MAGNESIUM     Status: Normal   Collection Time   11/17/12 11:45 PM      Component Value Range   Magnesium 2.2  1.5 - 2.5 mg/dL  PHOSPHORUS     Status: Normal   Collection Time   11/17/12 11:45 PM      Component Value Range   Phosphorus 2.8  2.3 - 4.6 mg/dL  GLUCOSE, CAPILLARY     Status: Abnormal   Collection Time   11/18/12 12:22 AM      Component Value Range   Glucose-Capillary 134 (*) 70 - 99 mg/dL   Comment 1 Documented in Chart     Comment 2 Notify RN    GLUCOSE, CAPILLARY     Status: Abnormal   Collection Time   11/18/12  3:52 AM      Component Value Range   Glucose-Capillary 116 (*) 70 - 99 mg/dL  PROTIME-INR  Status: Abnormal   Collection Time   11/18/12  4:10 AM      Component Value Range   Prothrombin Time 27.4 (*) 11.6 - 15.2  seconds   INR 2.71 (*) 0.00 - 1.49  BASIC METABOLIC PANEL     Status: Abnormal   Collection Time   11/18/12  4:10 AM      Component Value Range   Sodium 151 (*) 135 - 145 mEq/L   Potassium 3.3 (*) 3.5 - 5.1 mEq/L   Chloride 104  96 - 112 mEq/L   CO2 35 (*) 19 - 32 mEq/L   Glucose, Bld 140 (*) 70 - 99 mg/dL   BUN 90 (*) 6 - 23 mg/dL   Creatinine, Ser 1.61  0.50 - 1.10 mg/dL   Calcium 9.5  8.4 - 09.6 mg/dL   GFR calc non Af Amer 57 (*) >90 mL/min   GFR calc Af Amer 66 (*) >90 mL/min  CBC     Status: Abnormal   Collection Time   11/18/12  4:10 AM      Component Value Range   WBC 13.3 (*) 4.0 - 10.5 K/uL   RBC 4.40  3.87 - 5.11 MIL/uL   Hemoglobin 13.5  12.0 - 15.0 g/dL   HCT 04.5  40.9 - 81.1 %   MCV 97.5  78.0 - 100.0 fL   MCH 30.7  26.0 - 34.0 pg   MCHC 31.5  30.0 - 36.0 g/dL   RDW 91.4 (*) 78.2 - 95.6 %   Platelets 207  150 - 400 K/uL  MAGNESIUM     Status: Normal   Collection Time   11/18/12  4:10 AM      Component Value Range   Magnesium 2.2  1.5 - 2.5 mg/dL  PHOSPHORUS     Status: Normal   Collection Time   11/18/12  4:10 AM      Component Value Range   Phosphorus 2.7  2.3 - 4.6 mg/dL  VANCOMYCIN, TROUGH     Status: Abnormal   Collection Time   11/18/12  4:10 AM      Component Value Range   Vancomycin Tr 35.1 (*) 10.0 - 20.0 ug/mL  PROCALCITONIN     Status: Normal   Collection Time   11/18/12  4:10 AM      Component Value Range   Procalcitonin 0.21    GLUCOSE, CAPILLARY     Status: Abnormal   Collection Time   11/18/12  8:41 AM      Component Value Range   Glucose-Capillary 130 (*) 70 - 99 mg/dL    Signed: Genelle Gather 11/18/2012, 10:50 AM   Time Spent on Discharge: 35 min Agree with above D Sharnette Kitamura, mc

## 2012-11-19 LAB — CULTURE, BLOOD (ROUTINE X 2): Culture: NO GROWTH

## 2012-11-20 LAB — VANCOMYCIN, TROUGH: Vancomycin Tr: 23.4 ug/mL — ABNORMAL HIGH (ref 10.0–20.0)

## 2012-11-20 LAB — PROTIME-INR: INR: 3.31 — ABNORMAL HIGH (ref 0.00–1.49)

## 2012-11-21 LAB — BASIC METABOLIC PANEL
Calcium: 9.9 mg/dL (ref 8.4–10.5)
Creatinine, Ser: 1.06 mg/dL (ref 0.50–1.10)
GFR calc Af Amer: 61 mL/min — ABNORMAL LOW (ref >90)
GFR calc non Af Amer: 53 mL/min — ABNORMAL LOW (ref >90)
Sodium: 162 mEq/L (ref 135–145)

## 2012-11-21 LAB — RENAL FUNCTION PANEL
Albumin: 3 g/dL — ABNORMAL LOW (ref 3.5–5.2)
Calcium: 9.9 mg/dL (ref 8.4–10.5)
GFR calc Af Amer: 62 mL/min — ABNORMAL LOW (ref >90)
GFR calc non Af Amer: 53 mL/min — ABNORMAL LOW (ref >90)
Glucose, Bld: 147 mg/dL — ABNORMAL HIGH (ref 70–99)
Phosphorus: 3.7 mg/dL (ref 2.3–4.6)
Potassium: 2.7 mEq/L — CL (ref 3.5–5.1)
Sodium: 163 mEq/L (ref 135–145)

## 2012-11-21 LAB — CBC
MCHC: 31 g/dL (ref 30.0–36.0)
Platelets: 186 10*3/uL (ref 150–400)
RDW: 16.4 % — ABNORMAL HIGH (ref 11.5–15.5)
WBC: 11.7 10*3/uL — ABNORMAL HIGH (ref 4.0–10.5)

## 2012-11-21 LAB — PROTIME-INR
INR: 3.67 — ABNORMAL HIGH (ref 0.00–1.49)
Prothrombin Time: 34.3 seconds — ABNORMAL HIGH (ref 11.6–15.2)

## 2012-11-22 LAB — PROTIME-INR: Prothrombin Time: 37.8 seconds — ABNORMAL HIGH (ref 11.6–15.2)

## 2012-11-22 LAB — POTASSIUM: Potassium: 2.8 mEq/L — ABNORMAL LOW (ref 3.5–5.1)

## 2012-11-22 LAB — CULTURE, BLOOD (ROUTINE X 2)
Culture: NO GROWTH
Culture: NO GROWTH

## 2012-11-22 LAB — BASIC METABOLIC PANEL
BUN: 57 mg/dL — ABNORMAL HIGH (ref 6–23)
Calcium: 9.3 mg/dL (ref 8.4–10.5)
Creatinine, Ser: 0.85 mg/dL (ref 0.50–1.10)
GFR calc Af Amer: 80 mL/min — ABNORMAL LOW (ref >90)
GFR calc non Af Amer: 69 mL/min — ABNORMAL LOW (ref >90)

## 2012-11-23 LAB — PROTIME-INR: INR: 5.21 (ref 0.00–1.49)

## 2012-11-23 LAB — BASIC METABOLIC PANEL
BUN: 42 mg/dL — ABNORMAL HIGH (ref 6–23)
CO2: 39 mEq/L — ABNORMAL HIGH (ref 19–32)
Chloride: 106 mEq/L (ref 96–112)
Creatinine, Ser: 0.75 mg/dL (ref 0.50–1.10)
Glucose, Bld: 163 mg/dL — ABNORMAL HIGH (ref 70–99)

## 2012-11-24 LAB — BASIC METABOLIC PANEL
Chloride: 99 mEq/L (ref 96–112)
GFR calc non Af Amer: 87 mL/min — ABNORMAL LOW (ref >90)
Glucose, Bld: 172 mg/dL — ABNORMAL HIGH (ref 70–99)
Potassium: 4.1 mEq/L (ref 3.5–5.1)
Sodium: 145 mEq/L (ref 135–145)

## 2012-11-24 LAB — CBC
Hemoglobin: 13.5 g/dL (ref 12.0–15.0)
MCH: 30.7 pg (ref 26.0–34.0)
RBC: 4.4 MIL/uL (ref 3.87–5.11)

## 2012-11-25 LAB — BASIC METABOLIC PANEL
BUN: 39 mg/dL — ABNORMAL HIGH (ref 6–23)
Chloride: 99 mEq/L (ref 96–112)
GFR calc Af Amer: >90 mL/min (ref >90)
Potassium: 4.6 mEq/L (ref 3.5–5.1)

## 2012-11-25 LAB — PROCALCITONIN: Procalcitonin: 0.33 ng/mL

## 2012-11-25 LAB — PROTIME-INR
INR: 2.11 — ABNORMAL HIGH (ref 0.00–1.49)
Prothrombin Time: 22.8 seconds — ABNORMAL HIGH (ref 11.6–15.2)

## 2012-11-26 ENCOUNTER — Other Ambulatory Visit (HOSPITAL_COMMUNITY): Payer: Self-pay

## 2012-11-26 LAB — CBC WITH DIFFERENTIAL/PLATELET
Basophils Absolute: 0 10*3/uL (ref 0.0–0.1)
Lymphocytes Relative: 12 % (ref 12–46)
Neutro Abs: 10.6 10*3/uL — ABNORMAL HIGH (ref 1.7–7.7)
Neutrophils Relative %: 80 % — ABNORMAL HIGH (ref 43–77)
Platelets: 128 10*3/uL — ABNORMAL LOW (ref 150–400)
RDW: 16 % — ABNORMAL HIGH (ref 11.5–15.5)
WBC: 13.2 10*3/uL — ABNORMAL HIGH (ref 4.0–10.5)

## 2012-11-26 LAB — BASIC METABOLIC PANEL
CO2: 40 mEq/L (ref 19–32)
Calcium: 9 mg/dL (ref 8.4–10.5)
Chloride: 98 mEq/L (ref 96–112)
Sodium: 142 mEq/L (ref 135–145)

## 2012-11-26 LAB — PROTIME-INR: INR: 1.59 — ABNORMAL HIGH (ref 0.00–1.49)

## 2012-11-26 LAB — PROCALCITONIN: Procalcitonin: <0.1 ng/mL

## 2012-11-27 LAB — BASIC METABOLIC PANEL
BUN: 35 mg/dL — ABNORMAL HIGH (ref 6–23)
Calcium: 8.8 mg/dL (ref 8.4–10.5)
Creatinine, Ser: 0.63 mg/dL (ref 0.50–1.10)
GFR calc non Af Amer: >90 mL/min (ref >90)
Glucose, Bld: 151 mg/dL — ABNORMAL HIGH (ref 70–99)
Sodium: 140 mEq/L (ref 135–145)

## 2012-11-27 LAB — CBC
MCH: 30.3 pg (ref 26.0–34.0)
Platelets: 119 10*3/uL — ABNORMAL LOW (ref 150–400)
RBC: 4.29 MIL/uL (ref 3.87–5.11)
RDW: 16 % — ABNORMAL HIGH (ref 11.5–15.5)
WBC: 12.2 10*3/uL — ABNORMAL HIGH (ref 4.0–10.5)

## 2012-11-27 LAB — PROTIME-INR: Prothrombin Time: 17.4 seconds — ABNORMAL HIGH (ref 11.6–15.2)

## 2012-11-28 LAB — CBC WITH DIFFERENTIAL/PLATELET
Basophils Relative: 0 % (ref 0–1)
Eosinophils Absolute: 0 10*3/uL (ref 0.0–0.7)
Eosinophils Relative: 0 % (ref 0–5)
HCT: 39.3 % (ref 36.0–46.0)
Hemoglobin: 12.8 g/dL (ref 12.0–15.0)
MCH: 30.2 pg (ref 26.0–34.0)
MCHC: 32.6 g/dL (ref 30.0–36.0)
MCV: 92.7 fL (ref 78.0–100.0)
Monocytes Absolute: 1.1 10*3/uL — ABNORMAL HIGH (ref 0.1–1.0)
Monocytes Relative: 9 % (ref 3–12)

## 2012-11-28 LAB — PROTIME-INR: Prothrombin Time: 16.9 seconds — ABNORMAL HIGH (ref 11.6–15.2)

## 2012-11-29 LAB — COMPREHENSIVE METABOLIC PANEL
Albumin: 2.4 g/dL — ABNORMAL LOW (ref 3.5–5.2)
BUN: 27 mg/dL — ABNORMAL HIGH (ref 6–23)
Chloride: 90 mEq/L — ABNORMAL LOW (ref 96–112)
Creatinine, Ser: 0.61 mg/dL (ref 0.50–1.10)
GFR calc Af Amer: >90 mL/min (ref >90)
Glucose, Bld: 152 mg/dL — ABNORMAL HIGH (ref 70–99)
Total Bilirubin: 0.3 mg/dL (ref 0.3–1.2)
Total Protein: 6.5 g/dL (ref 6.0–8.3)

## 2012-11-29 LAB — CBC
HCT: 39.2 % (ref 36.0–46.0)
Hemoglobin: 13 g/dL (ref 12.0–15.0)
MCHC: 33.2 g/dL (ref 30.0–36.0)
MCV: 92 fL (ref 78.0–100.0)
RDW: 15.9 % — ABNORMAL HIGH (ref 11.5–15.5)

## 2012-11-30 LAB — PROTIME-INR
INR: 1.57 — ABNORMAL HIGH (ref 0.00–1.49)
Prothrombin Time: 18.3 seconds — ABNORMAL HIGH (ref 11.6–15.2)

## 2012-11-30 LAB — CBC
Hemoglobin: 12.8 g/dL (ref 12.0–15.0)
MCH: 30.3 pg (ref 26.0–34.0)
MCHC: 33.1 g/dL (ref 30.0–36.0)
Platelets: 107 10*3/uL — ABNORMAL LOW (ref 150–400)

## 2012-12-01 LAB — CBC WITH DIFFERENTIAL/PLATELET
Basophils Absolute: 0 10*3/uL (ref 0.0–0.1)
Basophils Relative: 0 % (ref 0–1)
Eosinophils Relative: 2 % (ref 0–5)
HCT: 40.9 % (ref 36.0–46.0)
MCH: 29.8 pg (ref 26.0–34.0)
MCHC: 32.8 g/dL (ref 30.0–36.0)
MCV: 91.1 fL (ref 78.0–100.0)
Monocytes Absolute: 1.3 10*3/uL — ABNORMAL HIGH (ref 0.1–1.0)
RDW: 16.3 % — ABNORMAL HIGH (ref 11.5–15.5)

## 2012-12-01 LAB — COMPREHENSIVE METABOLIC PANEL
AST: 60 U/L — ABNORMAL HIGH (ref 0–37)
Albumin: 2.5 g/dL — ABNORMAL LOW (ref 3.5–5.2)
CO2: 36 mEq/L — ABNORMAL HIGH (ref 19–32)
Calcium: 9 mg/dL (ref 8.4–10.5)
Creatinine, Ser: 0.68 mg/dL (ref 0.50–1.10)
GFR calc non Af Amer: 88 mL/min — ABNORMAL LOW (ref >90)

## 2012-12-01 LAB — BASIC METABOLIC PANEL

## 2012-12-01 LAB — PROTIME-INR

## 2012-12-01 LAB — CBC

## 2012-12-02 LAB — CBC
Hemoglobin: 13.5 g/dL (ref 12.0–15.0)
MCHC: 33.4 g/dL (ref 30.0–36.0)
Platelets: 131 10*3/uL — ABNORMAL LOW (ref 150–400)
RDW: 16.3 % — ABNORMAL HIGH (ref 11.5–15.5)

## 2012-12-02 LAB — BASIC METABOLIC PANEL
GFR calc Af Amer: >90 mL/min (ref >90)
GFR calc non Af Amer: 89 mL/min — ABNORMAL LOW (ref >90)
Glucose, Bld: 108 mg/dL — ABNORMAL HIGH (ref 70–99)
Potassium: 3.5 mEq/L (ref 3.5–5.1)
Sodium: 137 mEq/L (ref 135–145)

## 2012-12-02 LAB — PROTIME-INR
INR: 1.41 (ref 0.00–1.49)
Prothrombin Time: 16.9 seconds — ABNORMAL HIGH (ref 11.6–15.2)

## 2012-12-03 LAB — BASIC METABOLIC PANEL
Chloride: 90 mEq/L — ABNORMAL LOW (ref 96–112)
GFR calc Af Amer: >90 mL/min (ref >90)
GFR calc non Af Amer: >90 mL/min (ref >90)
Potassium: 3.2 mEq/L — ABNORMAL LOW (ref 3.5–5.1)
Sodium: 136 mEq/L (ref 135–145)

## 2012-12-03 LAB — PROTIME-INR
INR: 1.42 (ref 0.00–1.49)
Prothrombin Time: 17 seconds — ABNORMAL HIGH (ref 11.6–15.2)

## 2012-12-03 LAB — CBC
Platelets: 119 10*3/uL — ABNORMAL LOW (ref 150–400)
RDW: 16.2 % — ABNORMAL HIGH (ref 11.5–15.5)
WBC: 9 10*3/uL (ref 4.0–10.5)

## 2012-12-04 LAB — BASIC METABOLIC PANEL
CO2: 36 mEq/L — ABNORMAL HIGH (ref 19–32)
Calcium: 8.9 mg/dL (ref 8.4–10.5)
Chloride: 89 mEq/L — ABNORMAL LOW (ref 96–112)
GFR calc Af Amer: >90 mL/min (ref >90)
Sodium: 132 mEq/L — ABNORMAL LOW (ref 135–145)

## 2012-12-04 LAB — CBC
Platelets: 110 10*3/uL — ABNORMAL LOW (ref 150–400)
RBC: 4.46 MIL/uL (ref 3.87–5.11)
RDW: 16.3 % — ABNORMAL HIGH (ref 11.5–15.5)
WBC: 7.6 10*3/uL (ref 4.0–10.5)

## 2012-12-04 LAB — PROTIME-INR: Prothrombin Time: 18.4 seconds — ABNORMAL HIGH (ref 11.6–15.2)

## 2012-12-05 LAB — PROTIME-INR
INR: 1.75 — ABNORMAL HIGH (ref 0.00–1.49)
Prothrombin Time: 19.8 seconds — ABNORMAL HIGH (ref 11.6–15.2)

## 2012-12-05 LAB — BASIC METABOLIC PANEL
Calcium: 9 mg/dL (ref 8.4–10.5)
GFR calc Af Amer: >90 mL/min (ref >90)
GFR calc non Af Amer: >90 mL/min (ref >90)
Glucose, Bld: 96 mg/dL (ref 70–99)
Potassium: 3.4 mEq/L — ABNORMAL LOW (ref 3.5–5.1)
Sodium: 136 mEq/L (ref 135–145)

## 2012-12-05 LAB — CBC
Hemoglobin: 13.3 g/dL (ref 12.0–15.0)
MCH: 30.2 pg (ref 26.0–34.0)
MCHC: 33.6 g/dL (ref 30.0–36.0)
Platelets: 114 10*3/uL — ABNORMAL LOW (ref 150–400)
RBC: 4.41 MIL/uL (ref 3.87–5.11)

## 2012-12-06 LAB — PROTIME-INR
INR: 2.03 — ABNORMAL HIGH (ref 0.00–1.49)
Prothrombin Time: 22.1 seconds — ABNORMAL HIGH (ref 11.6–15.2)

## 2012-12-06 LAB — POTASSIUM: Potassium: 3.7 mEq/L (ref 3.5–5.1)

## 2012-12-07 LAB — CBC WITH DIFFERENTIAL/PLATELET
Eosinophils Absolute: 0.2 10*3/uL (ref 0.0–0.7)
Lymphocytes Relative: 20 % (ref 12–46)
Lymphs Abs: 1.6 10*3/uL (ref 0.7–4.0)
Neutro Abs: 5.2 10*3/uL (ref 1.7–7.7)
Neutrophils Relative %: 64 % (ref 43–77)
Platelets: 111 10*3/uL — ABNORMAL LOW (ref 150–400)
RBC: 4.29 MIL/uL (ref 3.87–5.11)
WBC: 8.2 10*3/uL (ref 4.0–10.5)

## 2012-12-07 LAB — COMPREHENSIVE METABOLIC PANEL
ALT: 69 U/L — ABNORMAL HIGH (ref 0–35)
Alkaline Phosphatase: 61 U/L (ref 39–117)
CO2: 36 mEq/L — ABNORMAL HIGH (ref 19–32)
Chloride: 90 mEq/L — ABNORMAL LOW (ref 96–112)
GFR calc Af Amer: >90 mL/min (ref >90)
Glucose, Bld: 92 mg/dL (ref 70–99)
Potassium: 3.8 mEq/L (ref 3.5–5.1)
Sodium: 135 mEq/L (ref 135–145)
Total Protein: 6.9 g/dL (ref 6.0–8.3)

## 2012-12-07 LAB — PROTIME-INR
INR: 2.3 — ABNORMAL HIGH (ref 0.00–1.49)
Prothrombin Time: 24.3 seconds — ABNORMAL HIGH (ref 11.6–15.2)

## 2012-12-08 ENCOUNTER — Inpatient Hospital Stay: Admit: 2012-12-08 | Payer: Self-pay | Admitting: Pulmonary Disease

## 2012-12-09 LAB — PROTIME-INR
INR: 2.04 — ABNORMAL HIGH (ref 0.00–1.49)
Prothrombin Time: 22.2 seconds — ABNORMAL HIGH (ref 11.6–15.2)

## 2013-03-01 ENCOUNTER — Ambulatory Visit: Payer: Medicare Other | Admitting: Nurse Practitioner

## 2013-04-02 ENCOUNTER — Ambulatory Visit (HOSPITAL_COMMUNITY)
Admission: RE | Admit: 2013-04-02 | Discharge: 2013-04-02 | Disposition: A | Discharge status: Home / Self Care | Payer: Medicare Other | Source: Ambulatory Visit | Attending: Internal Medicine | Admitting: Internal Medicine

## 2013-04-02 DIAGNOSIS — Z86711 Personal history of pulmonary embolism: Secondary | ICD-10-CM | POA: Insufficient documentation

## 2013-04-02 DIAGNOSIS — R0609 Other forms of dyspnea: Secondary | ICD-10-CM | POA: Insufficient documentation

## 2013-04-02 DIAGNOSIS — R0989 Other specified symptoms and signs involving the circulatory and respiratory systems: Secondary | ICD-10-CM | POA: Insufficient documentation

## 2013-04-02 LAB — BLOOD GAS, ARTERIAL
TCO2: 27.1 mmol/L (ref 0–100)
pCO2 arterial: 49.7 mmHg — ABNORMAL HIGH (ref 35.0–45.0)
pH, Arterial: 7.41 (ref 7.350–7.450)

## 2013-04-05 ENCOUNTER — Telehealth: Payer: Self-pay | Admitting: *Deleted

## 2013-04-05 NOTE — Telephone Encounter (Signed)
Please inform [facility] nurse Cherly @ [phone], that Kathleen [name] should take 6mg  of coumadin today, 3mg  Tuesday, 6mg  Wednesday, 3mg  Thursday, Recheck coumadin level on Friday.

## 2013-04-05 NOTE — Telephone Encounter (Signed)
Left detailed voicemail on nurses phone. Advised to call with any questions

## 2013-04-05 NOTE — Telephone Encounter (Signed)
INR 1.7, Protime 20.7. Pt was just discharged from a nursing facility and will see you on 04/22/13. Pt currently takes Coumadin 3mg  daily. Have your nurse to call Elnita Maxwell at [facility] with new orders. [phone]

## 2013-04-09 ENCOUNTER — Telehealth: Payer: Self-pay | Admitting: *Deleted

## 2013-04-09 NOTE — Telephone Encounter (Signed)
Protime 30.2, INR 2.5. Took 6 mg on Mon, 3 mg on Tues, 6 mg on Wed, and 3 mg on Thurs. Call Inetta Fermo at [facility] with new orders and next time it is to be checked

## 2013-04-09 NOTE — Telephone Encounter (Signed)
Please inform [facility] nurse that patient should take 3mg  of coumadin on Monday, Tuesday, Thursday, Saturday, and Sunday. She should take 6mg  on Wednesday and Friday. Recheck INR on Monday 04/12/13. thx

## 2013-04-09 NOTE — Telephone Encounter (Signed)
Home health aware of orders for coumadin.

## 2013-04-12 ENCOUNTER — Ambulatory Visit (INDEPENDENT_AMBULATORY_CARE_PROVIDER_SITE_OTHER): Payer: Medicare Other | Admitting: Pharmacist

## 2013-04-12 ENCOUNTER — Telehealth: Payer: Self-pay | Admitting: *Deleted

## 2013-04-12 DIAGNOSIS — I2699 Other pulmonary embolism without acute cor pulmonale: Secondary | ICD-10-CM

## 2013-04-12 LAB — POCT INR: INR: 2.5

## 2013-04-12 NOTE — Telephone Encounter (Signed)
protime 30.5, INR 2.5, Takes 6mg  on M,W,F and 3 mg all other days

## 2013-04-13 ENCOUNTER — Encounter (HOSPITAL_COMMUNITY): Payer: Medicare Other

## 2013-04-15 ENCOUNTER — Telehealth: Payer: Self-pay | Admitting: Pharmacist

## 2013-04-15 ENCOUNTER — Ambulatory Visit: Payer: Self-pay | Admitting: Pharmacist

## 2013-04-15 LAB — POCT INR: INR: 2

## 2013-04-15 NOTE — Telephone Encounter (Signed)
INR therapeutic at 2.0 Continue current warfarin dose of 6mg  MWF and 3mg  all other days

## 2013-04-21 ENCOUNTER — Ambulatory Visit: Payer: Self-pay | Admitting: Pharmacist

## 2013-04-21 ENCOUNTER — Telehealth: Payer: Self-pay | Admitting: *Deleted

## 2013-04-21 NOTE — Telephone Encounter (Signed)
Continue same warfarin does of 6mg  MWF and 3mg  all other days. Recheck protime in 1 week. Order given to [facility].

## 2013-04-21 NOTE — Telephone Encounter (Signed)
Protime 29.2, INR 2.4 Takes 6mg  M,W,F and 3mg  all other days

## 2013-04-22 ENCOUNTER — Ambulatory Visit (INDEPENDENT_AMBULATORY_CARE_PROVIDER_SITE_OTHER): Payer: Medicare Other | Admitting: General Practice

## 2013-04-22 ENCOUNTER — Encounter: Payer: Self-pay | Admitting: General Practice

## 2013-04-22 ENCOUNTER — Other Ambulatory Visit: Payer: Self-pay | Admitting: Pharmacist

## 2013-04-22 VITALS — BP 127/72 | HR 95 | Temp 98.1°F | Ht 64.5 in | Wt 318.5 lb

## 2013-04-22 DIAGNOSIS — I82403 Acute embolism and thrombosis of unspecified deep veins of lower extremity, bilateral: Secondary | ICD-10-CM

## 2013-04-22 DIAGNOSIS — R0602 Shortness of breath: Secondary | ICD-10-CM

## 2013-04-22 DIAGNOSIS — R7309 Other abnormal glucose: Secondary | ICD-10-CM

## 2013-04-22 DIAGNOSIS — G4730 Sleep apnea, unspecified: Secondary | ICD-10-CM

## 2013-04-22 DIAGNOSIS — Z09 Encounter for follow-up examination after completed treatment for conditions other than malignant neoplasm: Secondary | ICD-10-CM

## 2013-04-22 DIAGNOSIS — Z8639 Personal history of other endocrine, nutritional and metabolic disease: Secondary | ICD-10-CM

## 2013-04-22 DIAGNOSIS — Z862 Personal history of diseases of the blood and blood-forming organs and certain disorders involving the immune mechanism: Secondary | ICD-10-CM

## 2013-04-22 LAB — POCT CBC
Hemoglobin: 14.7 g/dL (ref 12.2–16.2)
MCH, POC: 35.1 pg — AB (ref 27–31.2)
MCV: 100.1 fL — AB (ref 80–97)
RBC: 4.1 M/uL (ref 4.04–5.48)

## 2013-04-22 LAB — THYROID PANEL WITH TSH: T4, Total: 7.7 ug/dL (ref 5.0–12.5)

## 2013-04-22 MED ORDER — WARFARIN SODIUM 3 MG PO TABS: 3.0000 mg | ORAL_TABLET | Freq: Every day | ORAL | Status: DC

## 2013-04-22 NOTE — Progress Notes (Signed)
  Subjective:    Patient ID: Kathleen [name], female    DOB: Aug 14, 1946, 67 y.o.   MRN: [id number]  HPI Patient presents today for follow up of discharge from [facility] in [place]. She was admitted to [facility] after discharge from [facility]. She reports being treated for respiratory failure. Patient reports having a history of sleep apnea and test performed 10 years ago, without follow up. She denies having or wearing a CPAP. Therefore, she needs a recent sleep study. She reports being short of breath constantly and wears oxygen 2 liters via nasal cannula. She has a history hypertension, obesity, OSA, PE, venous stasis, and anxiety. She reports her many of her medications were changed while in physical rehabilitation. She reports feeling better with each day of being home. She reports taking medications as directed.    Review of Systems  Constitutional: Negative for fever and chills.  HENT: Negative for neck pain and neck stiffness.   Respiratory: Positive for cough and shortness of breath. Negative for chest tightness and wheezing.   Cardiovascular: Positive for leg swelling. Negative for chest pain and palpitations.       Slight swelling in both legs  Gastrointestinal: Negative for vomiting, abdominal pain, diarrhea and blood in stool.  Genitourinary: Negative for dysuria, hematuria and difficulty urinating.  Neurological: Negative for dizziness, weakness and headaches.       Objective:   Physical Exam  Constitutional: She is oriented to person, place, and time. She appears well-developed and well-nourished.  Cardiovascular: Normal rate, regular rhythm and normal heart sounds.   + 1 non-pitting edema noted to bilateral lower extremities  Pulmonary/Chest: She has decreased breath sounds in the right lower field and the left lower field. She has no wheezes. She has no rhonchi. She has no rales.  Mild SOB on exertion and patient had no difficulty continuing activity of walking.   Neurological:  She is alert and oriented to person, place, and time.  Skin: Skin is warm and dry.  Psychiatric: She has a normal mood and affect.          Assessment & Plan:  1. Unspecified sleep apnea - Ambulatory referral to Sleep Studies  2. Follow-up exam, 3-6 months since previous exam - POCT CBC - COMPLETE METABOLIC PANEL WITH GFR - Thyroid Panel With TSH  3. Shortness of breath - Brain natriuretic peptide  4. Elevated random blood glucose level - POCT glycosylated hemoglobin (Hb A1C)  5. Hx of hyperlipidemia - NMR Lipoprofile with Lipids -Patient to follow up in one month with diary of blood pressure -Will reevaluate medications after labs are resulted -RTO if symptoms develop or worsen prior to scheduled visit -Patient verbalized understanding -Coralie Keens, FNP-C

## 2013-04-22 NOTE — Patient Instructions (Addendum)

## 2013-04-23 LAB — COMPLETE METABOLIC PANEL WITH GFR
Albumin: 3.6 g/dL (ref 3.5–5.2)
BUN: 27 mg/dL — ABNORMAL HIGH (ref 6–23)
Calcium: 9.1 mg/dL (ref 8.4–10.5)
Chloride: 99 mEq/L (ref 96–112)
GFR, Est Non African American: 70 mL/min
Glucose, Bld: 103 mg/dL — ABNORMAL HIGH (ref 70–99)
Potassium: 4.4 mEq/L (ref 3.5–5.3)

## 2013-04-23 LAB — BRAIN NATRIURETIC PEPTIDE: Brain Natriuretic Peptide: 21.9 pg/mL (ref 0.0–100.0)

## 2013-04-26 LAB — NMR LIPOPROFILE WITH LIPIDS
Cholesterol, Total: 343 mg/dL — ABNORMAL HIGH (ref <200)
LDL Particle Number: 2940 nmol/L — ABNORMAL HIGH (ref <1000)
Large HDL-P: <1.3 umol/L — ABNORMAL LOW (ref >=4.8)
Large VLDL-P: 12.8 nmol/L — ABNORMAL HIGH (ref <=2.7)
Triglycerides: 411 mg/dL — ABNORMAL HIGH (ref <150)
VLDL Size: 44.6 nm (ref <=46.6)

## 2013-04-28 ENCOUNTER — Ambulatory Visit (INDEPENDENT_AMBULATORY_CARE_PROVIDER_SITE_OTHER): Payer: Medicare Other | Admitting: Pharmacist

## 2013-04-28 ENCOUNTER — Telehealth: Payer: Self-pay | Admitting: *Deleted

## 2013-04-28 DIAGNOSIS — Z7901 Long term (current) use of anticoagulants: Secondary | ICD-10-CM

## 2013-04-28 LAB — POCT INR: INR: 2.1

## 2013-04-28 NOTE — Telephone Encounter (Signed)
INR results reviewed - therapeutic Continue same dose 6mg  MWF and 3mg  all other days.  Patient and Kathleen [name] with [facility] called.

## 2013-04-28 NOTE — Telephone Encounter (Signed)
INR 2.1  Takes 6 mg M,W, F and 3 mg all other days

## 2013-05-05 ENCOUNTER — Ambulatory Visit (INDEPENDENT_AMBULATORY_CARE_PROVIDER_SITE_OTHER): Payer: Medicare Other | Admitting: Pharmacist

## 2013-05-05 ENCOUNTER — Telehealth: Payer: Self-pay | Admitting: *Deleted

## 2013-05-05 LAB — POCT INR: INR: 2.8

## 2013-05-05 NOTE — Progress Notes (Signed)
Received phone message from [facility].  Protime 33.3, INR 2.8 Takes 6 mg M,W,F and 3mg  all other days. Inetta Fermo [phone]

## 2013-05-05 NOTE — Telephone Encounter (Signed)
Protime 33.3, INR 2.8 Takes 6 mg M,W,F and 3mg  all other days. Inetta Fermo [phone]

## 2013-05-05 NOTE — Telephone Encounter (Signed)
Therapeutic anticoagulation Recommend continue to take 6mg  MWF and 3mg  all other days.  Recheck protime in 1 week.  Patient and Kathleen [name] w/ [facility] notified of plan.

## 2013-05-05 NOTE — Telephone Encounter (Deleted)
Protime 33.3, INR 2.8 Takes 6 mg M,W,F and 3mg all other days. Tina [phone] 

## 2013-05-12 ENCOUNTER — Telehealth: Payer: Self-pay | Admitting: *Deleted

## 2013-05-12 ENCOUNTER — Ambulatory Visit (INDEPENDENT_AMBULATORY_CARE_PROVIDER_SITE_OTHER): Payer: Self-pay | Admitting: Pharmacist

## 2013-05-12 NOTE — Telephone Encounter (Signed)
Kathleen [name] and patient notified to continue current warfarin dose and recheck INR in 1-2 weeks.

## 2013-05-12 NOTE — Telephone Encounter (Signed)
Protime 34.3 INR 2.9

## 2013-05-18 ENCOUNTER — Telehealth: Payer: Self-pay | Admitting: *Deleted

## 2013-05-18 NOTE — Telephone Encounter (Signed)
Called [name] at [facility] and instructed her to tell patient to continue taking warfarin the same way and re-check in 1-2 weeks.

## 2013-05-18 NOTE — Telephone Encounter (Signed)
Protime 31.9 INR 2.7 , Takes 6mg   M,W,F and 3 mg all others

## 2013-05-24 ENCOUNTER — Encounter: Payer: Self-pay | Admitting: General Practice

## 2013-05-24 ENCOUNTER — Ambulatory Visit (INDEPENDENT_AMBULATORY_CARE_PROVIDER_SITE_OTHER): Payer: Medicare Other | Admitting: General Practice

## 2013-05-24 VITALS — BP 117/65 | HR 99 | Temp 98.7°F | Ht 64.5 in | Wt 321.5 lb

## 2013-05-24 DIAGNOSIS — Z09 Encounter for follow-up examination after completed treatment for conditions other than malignant neoplasm: Secondary | ICD-10-CM

## 2013-05-24 DIAGNOSIS — R609 Edema, unspecified: Secondary | ICD-10-CM

## 2013-05-24 NOTE — Progress Notes (Signed)
  Subjective:    Patient ID: Kathleen [name], female    DOB: Oct 01, 1946, 67 y.o.   MRN: [id number]  HPI Patient presents today for 1 month hospital follow up. She reports feeling better and less shortness of breath with exertion.     Review of Systems  Constitutional: Negative for fever and chills.  HENT: Positive for ear pain and neck stiffness. Negative for sore throat, sneezing, neck pain, postnasal drip and sinus pressure.   Respiratory: Negative for chest tightness, shortness of breath and wheezing.   Cardiovascular: Negative for chest pain and palpitations.  Genitourinary: Negative for difficulty urinating.  Neurological: Negative for dizziness, weakness and headaches.       Objective:   Physical Exam  Constitutional: She is oriented to person, place, and time. She appears well-developed and well-nourished.  HENT:  Head: Normocephalic and atraumatic.  Cardiovascular: Normal rate, regular rhythm and normal heart sounds.   Pulmonary/Chest: Effort normal and breath sounds normal.  Neurological: She is alert and oriented to person, place, and time.  Skin: Skin is warm and dry.  Psychiatric: She has a normal mood and affect.          Assessment & Plan:  1. Hospital discharge follow-up -continue current medications -RTO if symptoms worsen and in 2 months for 3 month follow up -Patient verbalized understanding -Coralie Keens, FNP-C

## 2013-05-26 ENCOUNTER — Ambulatory Visit (INDEPENDENT_AMBULATORY_CARE_PROVIDER_SITE_OTHER): Payer: Self-pay | Admitting: Pharmacist

## 2013-05-28 ENCOUNTER — Telehealth: Payer: Self-pay | Admitting: General Practice

## 2013-05-28 ENCOUNTER — Other Ambulatory Visit: Payer: Self-pay | Admitting: *Deleted

## 2013-05-28 MED ORDER — PRO-STAT AWC PO LIQD: 30.0000 mL | Freq: Two times a day (BID) | ORAL | Status: DC

## 2013-05-28 MED ORDER — FUROSEMIDE 40 MG PO TABS: 40.0000 mg | ORAL_TABLET | Freq: Two times a day (BID) | ORAL | Status: DC

## 2013-05-28 MED ORDER — PREDNISONE 5 MG PO TABS: 5.0000 mg | ORAL_TABLET | Freq: Every day | ORAL | Status: DC

## 2013-05-28 MED ORDER — POTASSIUM CHLORIDE CRYS ER 20 MEQ PO TBCR: 20.0000 meq | EXTENDED_RELEASE_TABLET | Freq: Two times a day (BID) | ORAL | Status: DC

## 2013-05-28 MED ORDER — DOCUSATE SODIUM 100 MG PO CAPS: 100.0000 mg | ORAL_CAPSULE | Freq: Two times a day (BID) | ORAL | Status: DC

## 2013-05-28 MED ORDER — POLYETHYLENE GLYCOL 3350 17 GM/SCOOP PO POWD: 17.0000 g | Freq: Two times a day (BID) | ORAL | Status: DC

## 2013-05-31 ENCOUNTER — Telehealth: Payer: Self-pay | Admitting: General Practice

## 2013-05-31 NOTE — Telephone Encounter (Signed)
Done 05/28/13 

## 2013-05-31 NOTE — Telephone Encounter (Signed)
appt given for tues at 11:00 with Kathleen [name]

## 2013-06-01 ENCOUNTER — Other Ambulatory Visit: Payer: Self-pay

## 2013-06-01 ENCOUNTER — Ambulatory Visit (INDEPENDENT_AMBULATORY_CARE_PROVIDER_SITE_OTHER): Payer: Medicare Other | Admitting: General Practice

## 2013-06-01 ENCOUNTER — Encounter: Payer: Self-pay | Admitting: General Practice

## 2013-06-01 ENCOUNTER — Other Ambulatory Visit: Payer: Self-pay | Admitting: *Deleted

## 2013-06-01 VITALS — BP 121/66 | HR 94 | Temp 98.0°F | Ht 64.5 in

## 2013-06-01 DIAGNOSIS — G4733 Obstructive sleep apnea (adult) (pediatric): Secondary | ICD-10-CM

## 2013-06-01 DIAGNOSIS — M542 Cervicalgia: Secondary | ICD-10-CM

## 2013-06-01 DIAGNOSIS — H6980 Other specified disorders of Eustachian tube, unspecified ear: Secondary | ICD-10-CM

## 2013-06-01 DIAGNOSIS — H6990 Unspecified Eustachian tube disorder, unspecified ear: Secondary | ICD-10-CM

## 2013-06-01 MED ORDER — DOCUSATE SODIUM 100 MG PO CAPS: 100.0000 mg | ORAL_CAPSULE | Freq: Two times a day (BID) | ORAL | Status: AC

## 2013-06-01 NOTE — Progress Notes (Signed)
  Subjective:    Patient ID: Kathleen [name], female    DOB: 05/15/1946, 67 y.o.   MRN: [id number]  HPI Patient presents today complaints of popping sound in ears. She has a history of these symptoms and reports seeing an ENT specialist in the past. She denies recent colds. Denies OTC medications. She also reports having right neck pain and believes it to be from fall in January. She reports pain when turning head.      Review of Systems  Constitutional: Negative for fever and chills.  HENT: Positive for neck pain. Negative for hearing loss, congestion, sore throat, sneezing, neck stiffness, postnasal drip and sinus pressure.   Respiratory: Negative for chest tightness and shortness of breath.   Cardiovascular: Negative for chest pain and palpitations.  Neurological: Negative for dizziness, weakness and headaches.       Objective:   Physical Exam  Constitutional: She is oriented to person, place, and time. She appears well-developed and well-nourished.  HENT:  Head: Normocephalic and atraumatic.  Right Ear: External ear normal.  Left Ear: External ear normal.  Nose: Nose normal.  Mouth/Throat: Oropharynx is clear and moist.  Cardiovascular: Normal rate, regular rhythm and normal heart sounds.   Pulmonary/Chest: Effort normal and breath sounds normal. No respiratory distress. She exhibits no tenderness.  Neurological: She is alert and oriented to person, place, and time.  Skin: Skin is warm and dry.  Psychiatric: She has a normal mood and affect.          Assessment & Plan:  1. ETD (eustachian tube dysfunction), unspecified laterality - Ambulatory referral to ENT  2. Neck pain on right side -discussed muscle relaxer, but patient doesn't feel safe taking due to fall history -warm compresses three times daily for 15-20 minutes -RTO if symptoms worsen or seek emergency medical treatment -maintain appointment once scheduled with ENT specialist -Patient verbalized  understanding -Coralie Keens, FNP-C

## 2013-06-02 ENCOUNTER — Ambulatory Visit: Payer: Medicare Other | Attending: Family Medicine | Admitting: Sleep Medicine

## 2013-06-02 DIAGNOSIS — G4733 Obstructive sleep apnea (adult) (pediatric): Secondary | ICD-10-CM | POA: Insufficient documentation

## 2013-06-12 NOTE — Procedures (Signed)
[facility] [name] A. Gerilyn Pilgrim, MD     [email or web address]        [name], [name]                 ACCOUNT NO.:  [id number]  MEDICAL RECORD NO.:  [id number]          PATIENT TYPE:  OUT  LOCATION:  SLEEP LAB                     FACILITY:  [facility]  PHYSICIAN:  Kacee Koren A. Gerilyn Pilgrim, M.D. DATE OF BIRTH:  November 18, 1945  DATE OF STUDY:  06/02/2013                           NOCTURNAL POLYSOMNOGRAM  REFERRING PHYSICIAN:  Ernestina Penna, M.D.  REFERRING PHYSICIAN:  Ernestina Penna, M.D.  INDICATION:  A 67 year old lady who presents with loud snoring, witnessed apnea, hypersomnia, and difficulty concentrating.  MEDICATIONS:  Combivent inhaler, amiodarone, Colace, Lasix, MiraLAX, potassium, prednisone, warfarin, vitamin C.  EPWORTH SLEEPINESS SCALE:  16.  BMI:  54.  ARCHITECTURAL SUMMARY:  This is a split night recording with initial portion being a diagnostic and 2nd portion titration recording.  The total recording time is 366 minutes.  Sleep efficiency is 57%.  Sleep latency 8 minutes.  REM latency 98 minutes.  RESPIRATORY SUMMARY:  Baseline oxygen saturation is 94, lowest saturation seen is 67 during REM sleep.  The patient is on 2 L oxygen, but slowly was done without oxygen.  The patient is noted to have runs of hypoxia, stretches for a period of time without obstructive events indicative of hypoventilation syndrome.  This is a critically worse during REM sleep.  The patient's diagnostic AHI is 32.  She was placed on positive pressure starting at 6 and titrated to 15.  The optimal pressure is 15 even at this pressure she did have some REM related desaturations, which appeared more central with eye movements.  She went as low as 84 with optimal pressure.  Again during the REM cycle, she had desaturations without obstructive events.  LIMB MOVEMENT SUMMARY:  PLM index 0.  ELECTROCARDIOGRAM SUMMARY:  Average heart rate is 67 with no significant dysrhythmias  observed.  IMPRESSION: 1. Moderate to severe obstructive sleep apnea syndrome, which responds     well to a CPAP of 15. 2. Hypoventilation syndrome worse in REM.  RECOMMENDATIONS:  CPAP of 15 with 2 L nasal oxygen bled in.  Thanks for this referral.    Jamarcus Laduke A. Gerilyn Pilgrim, M.D.    KAD/MEDQ  D:  06/12/2013 19:59:21  T:  06/12/2013 16:10:96  Job:  [id number]

## 2013-06-17 ENCOUNTER — Telehealth: Payer: Self-pay | Admitting: General Practice

## 2013-06-17 NOTE — Telephone Encounter (Signed)
Sleep test result.

## 2013-06-22 ENCOUNTER — Encounter: Payer: Self-pay | Admitting: *Deleted

## 2013-06-28 ENCOUNTER — Ambulatory Visit (INDEPENDENT_AMBULATORY_CARE_PROVIDER_SITE_OTHER): Payer: Medicare Other | Admitting: Pharmacist

## 2013-06-28 DIAGNOSIS — I4891 Unspecified atrial fibrillation: Secondary | ICD-10-CM

## 2013-06-28 NOTE — Telephone Encounter (Signed)
Pt will try to bring copy of sleep study for review.

## 2013-06-28 NOTE — Telephone Encounter (Signed)
Please inform that sleep study clinic should review results and make recommendations.

## 2013-06-28 NOTE — Patient Instructions (Signed)
Anticoagulation Dose Instructions as of 06/28/2013     Kathleen [name] Tue Wed Thu Fri Sat   New Dose 3 mg 6 mg 3 mg 6 mg 3 mg 6 mg 3 mg    Description       Take 2 tablets for next 3 days, then restart dose of 2 tablets on Mondays, Wednesdays and Fridays and 1 tablet on Sundays, Tuesdays, Thursdays and Saturdays.      INR was 1.8 today

## 2013-06-29 ENCOUNTER — Telehealth: Payer: Self-pay | Admitting: *Deleted

## 2013-06-29 NOTE — Telephone Encounter (Signed)
Pt aware of sleep study results 

## 2013-07-01 ENCOUNTER — Other Ambulatory Visit: Payer: Self-pay | Admitting: *Deleted

## 2013-07-01 MED ORDER — AMIODARONE HCL 200 MG PO TABS: 400.0000 mg | ORAL_TABLET | Freq: Every day | ORAL | Status: DC

## 2013-07-02 ENCOUNTER — Other Ambulatory Visit: Payer: Self-pay | Admitting: *Deleted

## 2013-07-02 DIAGNOSIS — G4733 Obstructive sleep apnea (adult) (pediatric): Secondary | ICD-10-CM

## 2013-07-07 ENCOUNTER — Other Ambulatory Visit: Payer: Self-pay | Admitting: General Practice

## 2013-07-09 ENCOUNTER — Ambulatory Visit (INDEPENDENT_AMBULATORY_CARE_PROVIDER_SITE_OTHER): Payer: Medicare Other | Admitting: Family Medicine

## 2013-07-09 ENCOUNTER — Encounter: Payer: Self-pay | Admitting: Physician Assistant

## 2013-07-09 VITALS — BP 127/58 | HR 99 | Temp 97.7°F | Ht 64.0 in | Wt 330.0 lb

## 2013-07-09 DIAGNOSIS — G4733 Obstructive sleep apnea (adult) (pediatric): Secondary | ICD-10-CM

## 2013-07-09 DIAGNOSIS — L02419 Cutaneous abscess of limb, unspecified: Secondary | ICD-10-CM

## 2013-07-09 DIAGNOSIS — R609 Edema, unspecified: Secondary | ICD-10-CM

## 2013-07-09 NOTE — Progress Notes (Signed)
  Subjective:    Patient ID: Kathleen [name], female    DOB: 24-Feb-1946, 67 y.o.   MRN: [id number]  HPI This 67 y.o. female presents for evaluation of lower extremity and cellulitis.  She was hospitalized For hypercapnia, respiratory failure, SIRS, cellulitis, and lower extremity edema.  She fell and passed Out at home and she was taken to [facility] and she was placed on ventilator in ICU. She was discharged and then [facility]-intubated and was on ventilator and then weaned and then she was DC'd to [facility] and she was then [facility]  In 04/02/13.  She has been using bipap for her OSA off and on and she is awaiting for cpap. She has been having problems with LE edema and she is here for pain in her lower extremities. She is unable to put on venous compression stockings and gets into problems with cellulitis. She is due for [facility] and labs on Thursday.  .   Review of Systems C/o bilateral lower extremity pain and edema .No chest pain, SOB, HA, dizziness, vision change, N/V, diarrhea, constipation, dysuria, urinary urgency or frequency, myalgias, arthralgias or rash.     Objective:   Physical Exam  Vital signs noted  Chronically ill appearing obese female with walker.  HEENT - Head atraumatic Normocephalic Respiratory - Lungs CTA bilateral Cardiac - RRR S1 and S2 without murmur GI - Abdomen obese soft Nontender and bowel sounds active x 4 Extremities - Bilateral LEs with anasarca edema and erythema from feet to legs  Neuro - Grossly intact.      Assessment & Plan:  Edema - Plan: Ambulatory referral to Home Health.  She needs her Unnas changed every 3 days.  Unnas boots applied bilateral lower extremities.  She is to follow up on 07/14/13 for re -eval and PTINR for warfarin therapy.  She is going to need unnas changed on 9/21 or 9/22.  OSAS - She is supposed to have her respiratory provider give her CPAP mask and encouraged Her to make sure she  is wearing to avoid any hypercapnea issues etc.  Follow up in one week.

## 2013-07-09 NOTE — Patient Instructions (Addendum)
Edema  Edema is an abnormal build-up of fluids in tissues. Because this is partly dependent on gravity (water flows to the lowest place), it is more common in the legs and thighs (lower extremities). It is also common in the looser tissues, like around the eyes. Painless swelling of the feet and ankles is common and increases as a person ages. It may affect both legs and may include the calves or even thighs. When squeezed, the fluid may move out of the affected area and may leave a dent for a few moments.  CAUSES    Prolonged standing or sitting in one place for extended periods of time. Movement helps pump tissue fluid into the veins, and absence of movement prevents this, resulting in edema.   Varicose veins. The valves in the veins do not work as well as they should. This causes fluid to leak into the tissues.   Fluid and salt overload.   Injury, burn, or surgery to the leg, ankle, or foot, may damage veins and allow fluid to leak out.   Sunburn damages vessels. Leaky vessels allow fluid to go out into the sunburned tissues.   Allergies (from insect bites or stings, medications or chemicals) cause swelling by allowing vessels to become leaky.   Protein in the blood helps keep fluid in your vessels. Low protein, as in malnutrition, allows fluid to leak out.   Hormonal changes, including pregnancy and menstruation, cause fluid retention. This fluid may leak out of vessels and cause edema.   Medications that cause fluid retention. Examples are sex hormones, blood pressure medications, steroid treatment, or anti-depressants.   Some illnesses cause edema, especially heart failure, kidney disease, or liver disease.   Surgery that cuts veins or lymph nodes, such as surgery done for the heart or for breast cancer, may result in edema.  DIAGNOSIS   Your caregiver is usually easily able to determine what is causing your swelling (edema) by simply asking what is wrong (getting a history) and examining you (doing  a physical). Sometimes x-rays, EKG (electrocardiogram or heart tracing), and blood work may be done to evaluate for underlying medical illness.  TREATMENT   General treatment includes:   Leg elevation (or elevation of the affected body part).   Restriction of fluid intake.   Prevention of fluid overload.   Compression of the affected body part. Compression with elastic bandages or support stockings squeezes the tissues, preventing fluid from entering and forcing it back into the blood vessels.   Diuretics (also called water pills or fluid pills) pull fluid out of your body in the form of increased urination. These are effective in reducing the swelling, but can have side effects and must be used only under your caregiver's supervision. Diuretics are appropriate only for some types of edema.  The specific treatment can be directed at any underlying causes discovered. Heart, liver, or kidney disease should be treated appropriately.  HOME CARE INSTRUCTIONS    Elevate the legs (or affected body part) above the level of the heart, while lying down.   Avoid sitting or standing still for prolonged periods of time.   Avoid putting anything directly under the knees when lying down, and do not wear constricting clothing or garters on the upper legs.   Exercising the legs causes the fluid to work back into the veins and lymphatic channels. This may help the swelling go down.   The pressure applied by elastic bandages or support stockings can help reduce ankle swelling.     A low-salt diet may help reduce fluid retention and decrease the ankle swelling.   Take any medications exactly as prescribed.  SEEK MEDICAL CARE IF:   Your edema is not responding to recommended treatments.  SEEK IMMEDIATE MEDICAL CARE IF:    You develop shortness of breath or chest pain.   You cannot breathe when you lay down; or if, while lying down, you have to get up and go to the window to get your breath.   You are having increasing  swelling without relief from treatment.   You develop a fever over 102 F (38.9 C).   You develop pain or redness in the areas that are swollen.   Tell your caregiver right away if you have gained 3 lb/1.4 kg in 1 day or 5 lb/2.3 kg in a week.  MAKE SURE YOU:    Understand these instructions.   Will watch your condition.   Will get help right away if you are not doing well or get worse.  Document Released: 10/07/2005 Document Revised: 04/07/2012 Document Reviewed: 05/25/2008  [employer] Patient Information 2014 [employer].

## 2013-07-15 ENCOUNTER — Telehealth: Payer: Self-pay | Admitting: Pharmacist

## 2013-07-15 ENCOUNTER — Encounter: Payer: Self-pay | Admitting: General Practice

## 2013-07-15 ENCOUNTER — Ambulatory Visit (INDEPENDENT_AMBULATORY_CARE_PROVIDER_SITE_OTHER): Payer: Medicare Other | Admitting: General Practice

## 2013-07-15 ENCOUNTER — Other Ambulatory Visit: Payer: Medicare Other

## 2013-07-15 ENCOUNTER — Ambulatory Visit: Payer: Self-pay | Admitting: Pharmacist

## 2013-07-15 VITALS — BP 129/74 | HR 75 | Temp 97.2°F | Ht 64.0 in | Wt 318.0 lb

## 2013-07-15 DIAGNOSIS — E669 Obesity, unspecified: Secondary | ICD-10-CM

## 2013-07-15 DIAGNOSIS — R69 Illness, unspecified: Secondary | ICD-10-CM

## 2013-07-15 DIAGNOSIS — I2699 Other pulmonary embolism without acute cor pulmonale: Secondary | ICD-10-CM

## 2013-07-15 DIAGNOSIS — R635 Abnormal weight gain: Secondary | ICD-10-CM

## 2013-07-15 DIAGNOSIS — Z7409 Other reduced mobility: Secondary | ICD-10-CM

## 2013-07-15 NOTE — Telephone Encounter (Signed)
Patient called and warfarin dosing clarified.

## 2013-07-15 NOTE — Progress Notes (Signed)
  Subjective:    Patient ID: Kathleen [name], female    DOB: [birth year], 67 y.o.   MRN: [id number]  HPI Patient presents today with concerns about completing activities of daily living (ADLs) and other assistance. She reports inability to adequately bath and perform personal hygiene needs. She also reports being unable to go grocery shopping, due to shortness of breath and leg/foot wounds. She needs transportation to doctors appointment and help with physical  needs assistance with bathing, grocery shopping, transport to doctors appointments, therapy (physical activity-walking). Patient appears to bath, groom and dress to the best of her ability, but hygiene issues still noted.     Review of Systems  Constitutional: Negative for fever and chills.  HENT: Negative for ear pain, sore throat, neck pain and neck stiffness.   Respiratory: Negative for chest tightness, shortness of breath and wheezing.   Cardiovascular: Positive for leg swelling. Negative for chest pain and palpitations.       Bilateral lower leg swelling  Genitourinary: Negative for difficulty urinating.  Neurological: Negative for dizziness, weakness and headaches.       Objective:   Physical Exam  Constitutional: She is oriented to person, place, and time. She appears well-developed and well-nourished.  HENT:  Head: Normocephalic and atraumatic.  Cardiovascular: Normal rate, regular rhythm and normal heart sounds.   Pulmonary/Chest: Effort normal and breath sounds normal. No respiratory distress. She exhibits no tenderness.  Shortness of breath with exertion  Neurological: She is alert and oriented to person, place, and time.  Skin: Skin is warm and dry.  Compression wraps to bilateral lower extremities  Psychiatric: She has a normal mood and affect.          Assessment & Plan:  1. Pulmonary embolism - POCT INR  2. Impaired mobility and ADLs -Patient needs assistance with activities of daily living (bathing,  dressing, grooming, light housekeeping, grocery shopping, assistance physical activity, etc.)  3. Obesity -discussed healthy eating habits -physical activity as tolerated -Patient verbalized understanding -Coralie Keens, FNP-C

## 2013-07-15 NOTE — Patient Instructions (Signed)
Anticoagulation Dose Instructions as of 07/15/2013     Glynis Smiles Tue Wed Thu Fri Sat   New Dose 6 mg 3 mg 6 mg 6 mg 6 mg 3 mg 6 mg    Description       Take 2 and 1/2 tablets today and tomorrow then Increase dose to 2 tablets on Sundays, Tuesdays, Wednesdays,Thursdays and Saturdays and 1 tablet on Mondays,  and Fridays     INR was 1.4 today

## 2013-07-19 ENCOUNTER — Other Ambulatory Visit: Payer: Self-pay | Admitting: General Practice

## 2013-07-19 ENCOUNTER — Telehealth: Payer: Self-pay | Admitting: General Practice

## 2013-07-21 ENCOUNTER — Other Ambulatory Visit: Payer: Self-pay | Admitting: General Practice

## 2013-07-22 ENCOUNTER — Ambulatory Visit (INDEPENDENT_AMBULATORY_CARE_PROVIDER_SITE_OTHER): Payer: Medicare Other | Admitting: Pharmacist

## 2013-07-22 DIAGNOSIS — I2699 Other pulmonary embolism without acute cor pulmonale: Secondary | ICD-10-CM

## 2013-07-22 LAB — POCT INR: INR: 1.5

## 2013-07-22 MED ORDER — IPRATROPIUM-ALBUTEROL 0.5-2.5 (3) MG/3ML IN SOLN: 3.0000 mL | Freq: Three times a day (TID) | RESPIRATORY_TRACT | Status: AC

## 2013-07-22 MED ORDER — POLYETHYLENE GLYCOL 3350 17 GM/SCOOP PO POWD: 17.0000 g | Freq: Two times a day (BID) | ORAL | Status: DC

## 2013-07-22 NOTE — Telephone Encounter (Signed)
Done through [facility]

## 2013-07-22 NOTE — Patient Instructions (Signed)
Anticoagulation Dose Instructions as of 07/22/2013     Kathleen [name] Tue Wed Thu Fri Sat   New Dose 6 mg 6 mg 6 mg 6 mg 6 mg 6 mg 6 mg    Description       Take 2 and 1/2 tablets today and tomorrow then increase dose to 2 tablets daily.        INR was 1.5 today

## 2013-07-26 ENCOUNTER — Ambulatory Visit: Payer: Self-pay | Admitting: Pharmacist

## 2013-07-26 ENCOUNTER — Other Ambulatory Visit: Payer: Self-pay | Admitting: General Practice

## 2013-07-26 DIAGNOSIS — I82409 Acute embolism and thrombosis of unspecified deep veins of unspecified lower extremity: Secondary | ICD-10-CM

## 2013-07-28 ENCOUNTER — Other Ambulatory Visit (INDEPENDENT_AMBULATORY_CARE_PROVIDER_SITE_OTHER): Payer: Medicare Other

## 2013-07-28 ENCOUNTER — Encounter (INDEPENDENT_AMBULATORY_CARE_PROVIDER_SITE_OTHER): Payer: Self-pay

## 2013-07-28 DIAGNOSIS — I82409 Acute embolism and thrombosis of unspecified deep veins of unspecified lower extremity: Secondary | ICD-10-CM

## 2013-07-29 ENCOUNTER — Telehealth: Payer: Self-pay | Admitting: Pharmacist

## 2013-07-29 ENCOUNTER — Ambulatory Visit (INDEPENDENT_AMBULATORY_CARE_PROVIDER_SITE_OTHER): Payer: Self-pay | Admitting: Pharmacist

## 2013-07-29 LAB — PROTIME-INR
INR: 3.3 — ABNORMAL HIGH (ref 0.8–1.2)
Prothrombin Time: 34.6 s — ABNORMAL HIGH (ref 9.1–12.0)

## 2013-07-29 NOTE — Telephone Encounter (Signed)
Appt given for Monday per patient request 

## 2013-07-29 NOTE — Telephone Encounter (Signed)
Called patient regarding protime results.  She asked about labs from 2 weeks ago but I didn't see anything bedside INR.  Also patient requesting f/u with Mae Halibuton.

## 2013-08-02 ENCOUNTER — Encounter: Payer: Self-pay | Admitting: General Practice

## 2013-08-02 ENCOUNTER — Ambulatory Visit (INDEPENDENT_AMBULATORY_CARE_PROVIDER_SITE_OTHER): Payer: Medicare Other | Admitting: General Practice

## 2013-08-02 ENCOUNTER — Encounter (INDEPENDENT_AMBULATORY_CARE_PROVIDER_SITE_OTHER): Payer: Self-pay

## 2013-08-02 VITALS — BP 125/72 | HR 80 | Temp 97.3°F

## 2013-08-02 DIAGNOSIS — E785 Hyperlipidemia, unspecified: Secondary | ICD-10-CM

## 2013-08-02 DIAGNOSIS — Z23 Encounter for immunization: Secondary | ICD-10-CM

## 2013-08-02 DIAGNOSIS — Z09 Encounter for follow-up examination after completed treatment for conditions other than malignant neoplasm: Secondary | ICD-10-CM

## 2013-08-02 DIAGNOSIS — I10 Essential (primary) hypertension: Secondary | ICD-10-CM

## 2013-08-02 NOTE — Progress Notes (Signed)
  Subjective:    Patient ID: Kathleen [name], female    DOB: Feb 29, 1946, 67 y.o.   MRN: [id number]  HPI Patient presents today for chronic health follow up. She has a history of hypertension, OSA, hyperlipidemia, GERD, AFIB. She report taking current medications as prescribed. She denies regular exercise or eating a healthy diet.     Review of Systems  Constitutional: Negative for fever and chills.  Respiratory: Negative for chest tightness and shortness of breath.   Cardiovascular: Positive for leg swelling. Negative for chest pain and palpitations.       Swelling in lower leg, wearing unna boots   Gastrointestinal: Negative for nausea, vomiting, abdominal pain, diarrhea, constipation and blood in stool.  Genitourinary: Negative for dysuria, hematuria and difficulty urinating.  Neurological: Negative for dizziness, weakness and headaches.       Objective:   Physical Exam  Constitutional: She is oriented to person, place, and time. She appears well-developed and well-nourished.  HENT:  Head: Normocephalic and atraumatic.  Right Ear: External ear normal.  Left Ear: External ear normal.  Nose: Nose normal.  Mouth/Throat: Oropharynx is clear and moist.  Eyes: EOM are normal. Pupils are equal, round, and reactive to light.  Neck: Normal range of motion. Neck supple. No thyromegaly present.  Cardiovascular: Normal rate, regular rhythm and normal heart sounds.   Pulses:      Dorsalis pedis pulses are 1+ on the right side, and 1+ on the left side.  Peripheral edema  Pulmonary/Chest: Effort normal and breath sounds normal. No respiratory distress. She exhibits no tenderness.  Abdominal: Soft. Bowel sounds are normal. She exhibits no distension. There is no tenderness.  Musculoskeletal: She exhibits no edema and no tenderness.  Lymphadenopathy:    She has no cervical adenopathy.  Neurological: She is alert and oriented to person, place, and time.  Skin: Skin is warm and dry.   Psychiatric: She has a normal mood and affect.          Assessment & Plan:  1. Other and unspecified hyperlipidemia  - NMR, lipoprofile; Future  2. Hypertension  - CMP14+EGFR; Future  3. Follow-up exam, 3-6 months since previous exam  - POCT CBC; Future  4. Need for prophylactic vaccination and inoculation against influenza -Continue all current medications Labs pending F/u in 3 months Discussed exercise and diet  Patient verbalized understanding Coralie Keens, FNP-C

## 2013-08-02 NOTE — Patient Instructions (Signed)

## 2013-08-04 ENCOUNTER — Other Ambulatory Visit (INDEPENDENT_AMBULATORY_CARE_PROVIDER_SITE_OTHER): Payer: Medicare Other

## 2013-08-04 DIAGNOSIS — I10 Essential (primary) hypertension: Secondary | ICD-10-CM

## 2013-08-04 DIAGNOSIS — Z79899 Other long term (current) drug therapy: Secondary | ICD-10-CM

## 2013-08-04 DIAGNOSIS — Z09 Encounter for follow-up examination after completed treatment for conditions other than malignant neoplasm: Secondary | ICD-10-CM

## 2013-08-04 DIAGNOSIS — E785 Hyperlipidemia, unspecified: Secondary | ICD-10-CM

## 2013-08-04 LAB — POCT CBC
Granulocyte percent: 70.1 %G (ref 37–80)
Lymph, poc: 1.7 (ref 0.6–3.4)
MCV: 99.8 fL — AB (ref 80–97)
MPV: 8.2 fL (ref 0–99.8)
POC Granulocyte: 4.7 (ref 2–6.9)
POC LYMPH PERCENT: 26 %L (ref 10–50)
Platelet Count, POC: 154 10*3/uL (ref 142–424)
RBC: 4.4 M/uL (ref 4.04–5.48)

## 2013-08-04 LAB — POCT INR: INR: 2.6

## 2013-08-04 NOTE — Progress Notes (Signed)
Pt here for labs only. 

## 2013-08-05 ENCOUNTER — Telehealth: Payer: Self-pay | Admitting: Pharmacist

## 2013-08-05 ENCOUNTER — Ambulatory Visit: Payer: Medicare Other | Admitting: Pharmacist

## 2013-08-05 ENCOUNTER — Telehealth: Payer: Self-pay | Admitting: General Practice

## 2013-08-05 NOTE — Telephone Encounter (Signed)
dulplicate phone call message - see previous message

## 2013-08-05 NOTE — Telephone Encounter (Signed)
INR results have not been released to [facility] labs because it is coupled with [facility] which takes longer to results.  Ladona Ridgel in laboratory was able to obtain INR results.   Patient's INR was 2.6 Recommend continue current dose of warfarin 3mg  Mondays, Wednesdays and Fridays 6mg  all other days Recheck protime in 2 weeks.

## 2013-08-06 LAB — CMP14+EGFR
Albumin: 3.6 g/dL (ref 3.6–4.8)
Alkaline Phosphatase: 71 IU/L (ref 39–117)
BUN/Creatinine Ratio: 30 — ABNORMAL HIGH (ref 11–26)
BUN: 27 mg/dL (ref 8–27)
CO2: 29 mmol/L (ref 18–29)
Calcium: 8.9 mg/dL (ref 8.6–10.2)
Creatinine, Ser: 0.9 mg/dL (ref 0.57–1.00)
Globulin, Total: 3.8 g/dL (ref 1.5–4.5)
Glucose: 102 mg/dL — ABNORMAL HIGH (ref 65–99)
Total Protein: 7.4 g/dL (ref 6.0–8.5)

## 2013-08-06 LAB — NMR, LIPOPROFILE
Cholesterol: 301 mg/dL — ABNORMAL HIGH (ref <200)
HDL Cholesterol by NMR: 44 mg/dL (ref >=40)
HDL Particle Number: 33.4 umol/L (ref >=30.5)
LDL Particle Number: 2388 nmol/L — ABNORMAL HIGH (ref <1000)
LDLC SERPL CALC-MCNC: 192 mg/dL — ABNORMAL HIGH (ref <100)
LP-IR Score: 78 — ABNORMAL HIGH (ref <=45)
Small LDL Particle Number: 1745 nmol/L — ABNORMAL HIGH (ref <=527)
Triglycerides by NMR: 324 mg/dL — ABNORMAL HIGH (ref <150)

## 2013-08-10 ENCOUNTER — Other Ambulatory Visit: Payer: Self-pay | Admitting: General Practice

## 2013-08-10 DIAGNOSIS — E785 Hyperlipidemia, unspecified: Secondary | ICD-10-CM

## 2013-08-10 MED ORDER — ATORVASTATIN CALCIUM 20 MG PO TABS: 20.0000 mg | ORAL_TABLET | Freq: Every day | ORAL | Status: DC

## 2013-08-11 ENCOUNTER — Telehealth: Payer: Self-pay | Admitting: General Practice

## 2013-08-12 ENCOUNTER — Telehealth: Payer: Self-pay | Admitting: Pharmacist

## 2013-08-12 ENCOUNTER — Ambulatory Visit (INDEPENDENT_AMBULATORY_CARE_PROVIDER_SITE_OTHER): Payer: Medicare Other | Admitting: Family Medicine

## 2013-08-12 VITALS — BP 112/63 | HR 90 | Temp 98.3°F | Ht 64.0 in | Wt 318.0 lb

## 2013-08-12 DIAGNOSIS — T148XXA Other injury of unspecified body region, initial encounter: Secondary | ICD-10-CM

## 2013-08-12 DIAGNOSIS — I872 Venous insufficiency (chronic) (peripheral): Secondary | ICD-10-CM

## 2013-08-12 DIAGNOSIS — R609 Edema, unspecified: Secondary | ICD-10-CM

## 2013-08-12 DIAGNOSIS — I878 Other specified disorders of veins: Secondary | ICD-10-CM

## 2013-08-12 MED ORDER — TORSEMIDE 20 MG PO TABS: 30.0000 mg | ORAL_TABLET | Freq: Two times a day (BID) | ORAL | Status: DC

## 2013-08-12 MED ORDER — DOXYCYCLINE HYCLATE 100 MG PO CAPS: 100.0000 mg | ORAL_CAPSULE | Freq: Two times a day (BID) | ORAL | Status: DC

## 2013-08-12 NOTE — Progress Notes (Signed)
  Subjective:    Patient ID: Kathleen [name], female    DOB: [birth year], 67 y.o.   MRN: [id number]  HPI Patient presents today with chief complaint of right foot blisters. The patient has a baseline history of multiple medical problems including morbid obesity, CHF, obstructive sleep apnea, pulmonary embolism on Coumadin chronically, chronic venous stasis ulcers. Patient has a home health nursing aide that puts a little bruise on. Patient states that she had her boot on the right side removed late last week. Unna boots went to her mid foot. Patient states that earlier this week she's noticed progressive blister formation at the border of the previous Unna boot. No redness or tenderness. No fevers or chills. Blister has progressively gotten more more swollen at home. Does take Lasix twice a day for chronic lower extremity edema. Patient denies any worsening edema. No orthopnea or PND. Does have chronic dyspnea.   Review of Systems  All other systems reviewed and are negative.       Objective:   Physical Exam  Constitutional:  Morbidly obese  HENT:  Head: Normocephalic and atraumatic.  Eyes: Conjunctivae are normal. Pupils are equal, round, and reactive to light.  Neck: Normal range of motion.  Cardiovascular: Normal rate and regular rhythm.   Pulmonary/Chest: Effort normal.  Abdominal:   Morbidly obese  Neurological: She is alert.  Skin:  Chronic look should be venous stasis changes. Right foot nontender over affected blister areas.          Assessment & Plan:  Friction blisters of the skin - Plan: Aerobic culture, Viral culture, doxycycline (VIBRAMYCIN) 100 MG capsule  Edema - Plan: torsemide (DEMADEX) 20 MG tablet  Venous stasis syndrome - Plan: AMB referral to wound care center   Suspected blisters likely traumatic in origin given chronic look should be edema and venous stasis changes. Will send fluid off for viral and bacterial culture. Start patient on doxycycline for  infectious coverage. Discussed doxycycline use with pharmacist in the setting of Coumadin use. Plan for INR recheck in 3 days. We'll formally refer to wound care. Will hold off on Unna boot replacement pending resolution of her blisters. Will also start patient on torsemide for improved gastrointestinal as well as systemic edema. Stop Lasix. Plan for followup in 5-7 days for reevaluation of symptoms. Discuss systemic as well as infectious, cardiovascular red flecks. Followup as needed.

## 2013-08-12 NOTE — Telephone Encounter (Signed)
Discussed at appointment.

## 2013-08-12 NOTE — Telephone Encounter (Signed)
Patient called - she just needed appt for lab for Monday 10/27

## 2013-08-14 LAB — AEROBIC CULTURE

## 2013-08-16 ENCOUNTER — Other Ambulatory Visit (INDEPENDENT_AMBULATORY_CARE_PROVIDER_SITE_OTHER): Payer: Medicare Other

## 2013-08-16 ENCOUNTER — Other Ambulatory Visit: Payer: Self-pay | Admitting: Nurse Practitioner

## 2013-08-16 ENCOUNTER — Encounter (INDEPENDENT_AMBULATORY_CARE_PROVIDER_SITE_OTHER): Payer: Self-pay

## 2013-08-16 ENCOUNTER — Other Ambulatory Visit: Payer: Self-pay | Admitting: Pharmacist

## 2013-08-16 DIAGNOSIS — I2699 Other pulmonary embolism without acute cor pulmonale: Secondary | ICD-10-CM

## 2013-08-16 NOTE — Progress Notes (Signed)
Patient came in for labs only.

## 2013-08-17 ENCOUNTER — Ambulatory Visit (INDEPENDENT_AMBULATORY_CARE_PROVIDER_SITE_OTHER): Payer: Self-pay | Admitting: Pharmacist

## 2013-08-17 ENCOUNTER — Telehealth: Payer: Self-pay | Admitting: Pharmacist

## 2013-08-17 DIAGNOSIS — I82409 Acute embolism and thrombosis of unspecified deep veins of unspecified lower extremity: Secondary | ICD-10-CM

## 2013-08-17 LAB — PROTIME-INR
INR: 5.8 — ABNORMAL HIGH (ref 0.8–1.2)
Prothrombin Time: 61.8 s — ABNORMAL HIGH (ref 9.1–12.0)

## 2013-08-17 NOTE — Telephone Encounter (Signed)
Patient called regarding recent INR result of 5.6.  She has recently started doxycycline for infection.  Patient was advised to hold warfarin for the next 2 days and have INR checked on Thursday 08/19/13.

## 2013-08-18 ENCOUNTER — Other Ambulatory Visit: Payer: Self-pay

## 2013-08-19 ENCOUNTER — Ambulatory Visit (INDEPENDENT_AMBULATORY_CARE_PROVIDER_SITE_OTHER): Payer: Medicare Other | Admitting: Family Medicine

## 2013-08-19 ENCOUNTER — Encounter: Payer: Self-pay | Admitting: Family Medicine

## 2013-08-19 VITALS — BP 112/65 | HR 93 | Temp 97.7°F | Ht 64.0 in | Wt 309.0 lb

## 2013-08-19 DIAGNOSIS — Z5189 Encounter for other specified aftercare: Secondary | ICD-10-CM

## 2013-08-19 DIAGNOSIS — Z7901 Long term (current) use of anticoagulants: Secondary | ICD-10-CM

## 2013-08-19 DIAGNOSIS — S80821D Blister (nonthermal), right lower leg, subsequent encounter: Secondary | ICD-10-CM

## 2013-08-19 DIAGNOSIS — R609 Edema, unspecified: Secondary | ICD-10-CM

## 2013-08-19 NOTE — Progress Notes (Signed)
  Subjective:    Patient ID: Kathleen [name], female    DOB: 25-Jan-1946, 67 y.o.   MRN: [id number]  HPI Pt presents today for wound follow up for multiple issues  Friction blister: Pt seen on 10/23 for friction blister in setting chronic LE edema and venous stasis ulcers (please see 10/23 note for full details). Serous fluid was drained from blister and sent for culture. Pt was placed on doxy for soft tissue coverage. Area wrapped. Wound care referral placed.  Has been compliant with doxy. No redness or erythema. Wound cx grew mixed flora. Viral cx still pending. Has not yet had wound care follow up.    Edema: Pt was changed from lasix 40mg  BID to torsemide 60mg  BID given significant LE edema and venous stasis changes. Pt has been compliant with this. 9 lb decrease since last visit. No cramping. No weakness.    INR:  On coumadin for VTE history. Also on doxy. Discussed increased INR checks with pharmacist in clinic. Most recent INR 5.8 on 10/27. Pt has not taken coumadin since 10/28. Had one mild nose bleed. Otherwise no hematuria, BRBPR. Also eating leafy greens.    Review of Systems  All other systems reviewed and are negative.       Objective:   Physical Exam  Constitutional:  Morbidly obese    HENT:  Head: Normocephalic and atraumatic.  Eyes: Conjunctivae are normal. Pupils are equal, round, and reactive to light.  Neck: Normal range of motion.  Cardiovascular: Normal rate and regular rhythm.   Pulmonary/Chest: Effort normal.  Abdominal:  Obese abdomen, non tender   Musculoskeletal: Normal range of motion.  Neurological: She is alert.  Skin:  2-3+ pitting edema, improved from last week  Chronic venous stasis changes            Assessment & Plan:  Edema - Plan: Comprehensive metabolic panel  Chronic anticoagulation - Plan: INR  Friction blister of leg, right, subsequent encounter  Friction blister: continue doxycycline. Follow up with wound care next week.  Recheck in clinic in 3-4 days. Discussed infectious red flags.   Chronic edema/Venous stasis: Continue torsemide. Good response so far. Check Cr and K today. Continue foot elevated. Will likely need bilateral unna boots either here or with wound care.   INR: Supratherapuetic currently. HOLD coumadin. Recheck today. Discussed bleeding red flags. Follow up in 3-4 days for recheck.

## 2013-08-20 LAB — PROTIME-INR
INR: 4.5 — ABNORMAL HIGH (ref 0.8–1.2)
Prothrombin Time: 47.2 s — ABNORMAL HIGH (ref 9.1–12.0)

## 2013-08-20 LAB — COMPREHENSIVE METABOLIC PANEL
AST: 43 IU/L — ABNORMAL HIGH (ref 0–40)
Albumin: 4.1 g/dL (ref 3.6–4.8)
Alkaline Phosphatase: 73 IU/L (ref 39–117)
BUN: 51 mg/dL — ABNORMAL HIGH (ref 8–27)
CO2: 28 mmol/L (ref 18–29)
Calcium: 9.1 mg/dL (ref 8.6–10.2)
Chloride: 92 mmol/L — ABNORMAL LOW (ref 97–108)
Creatinine, Ser: 1.2 mg/dL — ABNORMAL HIGH (ref 0.57–1.00)
Globulin, Total: 4.2 g/dL (ref 1.5–4.5)
Sodium: 143 mmol/L (ref 134–144)
Total Protein: 8.3 g/dL (ref 6.0–8.5)

## 2013-08-23 ENCOUNTER — Telehealth: Payer: Self-pay

## 2013-08-23 ENCOUNTER — Encounter: Payer: Self-pay | Admitting: Vascular Surgery

## 2013-08-23 ENCOUNTER — Ambulatory Visit (INDEPENDENT_AMBULATORY_CARE_PROVIDER_SITE_OTHER): Payer: Medicare Other | Admitting: General Practice

## 2013-08-23 ENCOUNTER — Encounter: Payer: Self-pay | Admitting: General Practice

## 2013-08-23 VITALS — BP 124/63 | HR 89 | Temp 97.9°F | Ht 64.0 in | Wt 316.5 lb

## 2013-08-23 DIAGNOSIS — I739 Peripheral vascular disease, unspecified: Secondary | ICD-10-CM

## 2013-08-23 DIAGNOSIS — I83011 Varicose veins of right lower extremity with ulcer of thigh: Secondary | ICD-10-CM

## 2013-08-23 DIAGNOSIS — G4733 Obstructive sleep apnea (adult) (pediatric): Secondary | ICD-10-CM

## 2013-08-23 DIAGNOSIS — L98499 Non-pressure chronic ulcer of skin of other sites with unspecified severity: Secondary | ICD-10-CM

## 2013-08-23 LAB — VIRAL CULTURE VIRC

## 2013-08-23 NOTE — Telephone Encounter (Signed)
Received referral for an ASAP evaluation for worsening right foot venous ulcer.  States pt. Was last seen on 08/19/13, and ulcer has worsened.  ASAP appt. requested by Dr. Leroy Kennedy.  Discussed w/ Dr. Myra Gianotti.  Recommends ABI's, venous reflux studies, and appt. tomorrow.  Notified Carlon @ [facility] of 9:30 AM appt. 08/24/13; she will contact the patient of appt. Date/ time.

## 2013-08-24 ENCOUNTER — Ambulatory Visit (INDEPENDENT_AMBULATORY_CARE_PROVIDER_SITE_OTHER)
Admission: RE | Admit: 2013-08-24 | Discharge: 2013-08-24 | Disposition: A | Discharge status: Home / Self Care | Payer: Medicare Other | Source: Ambulatory Visit | Attending: Vascular Surgery | Admitting: Vascular Surgery

## 2013-08-24 ENCOUNTER — Encounter: Payer: Self-pay | Admitting: Vascular Surgery

## 2013-08-24 ENCOUNTER — Ambulatory Visit (INDEPENDENT_AMBULATORY_CARE_PROVIDER_SITE_OTHER): Payer: Medicare Other | Admitting: Vascular Surgery

## 2013-08-24 ENCOUNTER — Ambulatory Visit (HOSPITAL_COMMUNITY)
Admission: RE | Admit: 2013-08-24 | Discharge: 2013-08-24 | Disposition: A | Discharge status: Home / Self Care | Payer: Medicare Other | Source: Ambulatory Visit | Attending: Vascular Surgery | Admitting: Vascular Surgery

## 2013-08-24 ENCOUNTER — Ambulatory Visit: Payer: Medicare Other | Admitting: Pharmacist

## 2013-08-24 VITALS — BP 119/74 | HR 87 | Resp 20 | Ht 65.0 in | Wt 318.0 lb

## 2013-08-24 DIAGNOSIS — I83011 Varicose veins of right lower extremity with ulcer of thigh: Secondary | ICD-10-CM

## 2013-08-24 DIAGNOSIS — I83009 Varicose veins of unspecified lower extremity with ulcer of unspecified site: Secondary | ICD-10-CM | POA: Insufficient documentation

## 2013-08-24 DIAGNOSIS — Z86711 Personal history of pulmonary embolism: Secondary | ICD-10-CM

## 2013-08-24 DIAGNOSIS — L98499 Non-pressure chronic ulcer of skin of other sites with unspecified severity: Secondary | ICD-10-CM | POA: Insufficient documentation

## 2013-08-24 DIAGNOSIS — L97909 Non-pressure chronic ulcer of unspecified part of unspecified lower leg with unspecified severity: Secondary | ICD-10-CM | POA: Insufficient documentation

## 2013-08-24 LAB — PROTIME-INR
INR: 1.5 — ABNORMAL HIGH (ref 0.8–1.2)
Prothrombin Time: 15.4 s — ABNORMAL HIGH (ref 9.1–12.0)

## 2013-08-24 NOTE — Progress Notes (Signed)
Subjective:     Patient ID: Kathleen [name], female   DOB: 1946-09-02, 67 y.o.   MRN: [id number]  HPI this 67 year old morbidly obese female was referred for a venous stasis ulcer in the right foot. Patient has been having chronic edema with severe skin changes of both lower extremities with pre-ulcerated areas for the last few years. She developed a blistered area on the dorsum of the right foot recently while having compression dressings for the edema. She has no history of DVT or thrombophlebitis. She does have a history of pulmonary embolus 2 years ago and is on chronic Coumadin therapy. She continues to have aching throbbing and burning discomfort in both lower extremities. Long-leg elastic compression stockings cannot be utilized in this morbidly obese female because of the size of her thighs.  Past Medical History  Diagnosis Date  . HTN (hypertension)   . Hyperlipemia   . GERD (gastroesophageal reflux disease)   . Obesity   . Sleep apnea     she has not been on CPAP  . PE (pulmonary embolism) 05/2011  . DOE (dyspnea on exertion) 01/22/2012  . Hematuria 01/22/2012  . Diabetes mellitus without complication   . A-fib   . Neurogenic bladder   . CHF (congestive heart failure)   . Ulcer     both feet    History  Substance Use Topics  . Smoking status: Never Smoker   . Smokeless tobacco: Never Used  . Alcohol Use: No    Family History  Problem Relation Age of Onset  . Adopted: Yes    No Known Allergies  Current outpatient prescriptions:Amino Acids-Protein Hydrolys (PRO-STAT AWC) LIQD, TAKE 30 ML TWICE DAILY, Disp: 887 mL, Rfl: 0;  amiodarone (PACERONE) 200 MG tablet, Take 2 tablets (400 mg total) by mouth daily. Take 2 tablets once daily, Disp: 60 tablet, Rfl: 1;  atorvastatin (LIPITOR) 20 MG tablet, Take 1 tablet (20 mg total) by mouth daily., Disp: 30 tablet, Rfl: 3 docusate sodium (COLACE) 100 MG capsule, Take 1 capsule (100 mg total) by mouth 2 (two) times daily., Disp: 60  capsule, Rfl: 3;  doxycycline (VIBRAMYCIN) 100 MG capsule, Take 1 capsule (100 mg total) by mouth 2 (two) times daily., Disp: 20 capsule, Rfl: 0;  ipratropium-albuterol (DUONEB) 0.5-2.5 (3) MG/3ML SOLN, Take 3 mLs by nebulization 3 (three) times daily., Disp: 360 mL, Rfl: 4 mometasone (NASONEX) 50 MCG/ACT nasal spray, Place 2 sprays into the nose daily. Use 2 sprays in each nostril daily, Disp: , Rfl: ;  MYRBETRIQ 25 MG TB24 tablet, TAKE 2 TABLETS ONCE A DAY FOR NEUROGENIC BLADDER, Disp: 60 tablet, Rfl: 2;  polyethylene glycol powder (GLYCOLAX/MIRALAX) powder, Take 17 g by mouth 2 (two) times daily., Disp: 1054 g, Rfl: 4 potassium chloride SA (K-DUR,KLOR-CON) 20 MEQ tablet, Take 1 tablet (20 mEq total) by mouth 2 (two) times daily. Dissolve and take 1 tablet in water 2 times daily, Disp: 60 tablet, Rfl: 2;  predniSONE (DELTASONE) 5 MG tablet, Take 1 tablet (5 mg total) by mouth daily., Disp: 30 tablet, Rfl: 2;  torsemide (DEMADEX) 20 MG tablet, Take 1.5 tablets (30 mg total) by mouth 2 (two) times daily., Disp: 90 tablet, Rfl: 3 vitamin C (ASCORBIC ACID) 500 MG tablet, Take 500 mg by mouth daily. Take 2 tablets 2 times daily, Disp: , Rfl: ;  warfarin (COUMADIN) 3 MG tablet, TAKE 1 OR 2 TABLETS ONCE DAILY AS DIRECTED, Disp: 60 tablet, Rfl: 2;  furosemide (LASIX) 40 MG tablet, , Disp: ,  Rfl:   BP 119/74  Pulse 87  Resp 20  Ht 5\' 5"  (1.651 m)  Wt 318 lb (144.244 kg)  BMI 52.92 kg/m2  Body mass index is 52.92 kg/(m^2).           Review of Systems patient has chronic dyspnea on exertion, and neurogenic bladder, occasional orthopnea, back pain. Other systems negative and complete review of systems     Objective:   Physical Exam BP 119/74  Pulse 87  Resp 20  Ht 5\' 5"  (1.651 m)  Wt 318 lb (144.244 kg)  BMI 52.92 kg/m2  Gen.-alert and oriented x3 in no apparent distress-morbidly obese  HEENT normal for age Lungs no rhonchi or wheezing Cardiovascular regular rhythm no murmurs carotid  pulses 3+ palpable no bruits audible Abdomen soft nontender no palpable masses Musculoskeletal free of  major deformities Skin clear -severe hyperpigmentation bilaterally left worse than right with pre-ulcerated area left lateral lower leg and active ulcer on the dorsum right foot measuring 2-1/2 cm in diameter. 2+ distal edema. Bilaterally Neurologic normal Lower extremities 3+ femoral and dorsalis pedis pulses palpable bilaterally  Today I ordered a venous duplex exam of the right leg which are reviewed and interpreted. She has gross reflux throughout the right great saphenous system with a large vein in no DVT she does have deep venous reflux as well. I visualized the left venous system with the SonoSite and she does have similar situation with gross reflux throughout the left great saphenous vein and no DVT.       Assessment:     Bilateral great saphenous reflux with ulceration right foot and Prelone serrated area left leg-unable to use long-leg elastic compression stockings because of morbid obesity.    Plan:     #1 patient needs laser ablation right great saphenous vein as soon as we can get this precertified mucosal ulceration #2 following first procedure patient will get formal venous duplex exam left leg to confirm reflux left great saphenous system and we'll then schedule her very soon for laser ablation left great saphenous  We should proceed now because of patient's inability to use elastic compression stockings and because of active ulcer right foot

## 2013-08-24 NOTE — Progress Notes (Addendum)
  Subjective:    Patient ID: Kathleen [name], female    DOB: 10/24/1945, 67 y.o.   MRN: [id number]  HPI Patient presents today for follow up of right foot wound. She reports her foot would looks the same, but the discoloration in feet seems worse. She denies drainage.  Patient also reports receiving a better quality of sleep while using her CPAP machine. She reports using at least 4 hours a night and feeling more rested.    Review of Systems  Constitutional: Negative for fever and chills.  Respiratory: Negative for chest tightness and shortness of breath.   Cardiovascular: Negative for chest pain and palpitations.  Genitourinary: Negative for difficulty urinating.  Neurological: Negative for dizziness, weakness and headaches.       Objective:   Physical Exam  Constitutional: She is oriented to person, place, and time. She appears well-developed and well-nourished.  Cardiovascular: Normal rate, regular rhythm and normal heart sounds.   Pulmonary/Chest: Effort normal and breath sounds normal. No respiratory distress. She exhibits no tenderness.  Neurological: She is alert and oriented to person, place, and time.  Skin: Skin is dry.  Bilateral lower legs and feet positive for discoloration (purplish), 3+ pitting edema to feet, cool to touch, negative for drainage. Right foot wound unchanged from last visit.    Psychiatric: She has a normal mood and affect.          Assessment & Plan:  1. PVD (peripheral vascular disease) - Ambulatory referral to Vascular Surgery (appointment scheduled while patient here in office, Patient informed that appointment is scheduled for 08/24/13 at 9:30am) -Patient and caregiver verbalized understanding of scheduled appointment and the importance of patient being present -Patient also has appointment scheduled for wound care clinic on Thursday and should maintain that appointment, unless instructed otherwise by vascular physician -continue to keep legs  elevated as instructed -may seek emergency medical treatment if symptoms worsen prior to appointment tomorrow - Protime-INR (pharmacist to call patient once INR results from lab) 2. Obstructive sleep apnea on CPAP -Patient compliant with the use of CPAP based on compliance report received, via fax from [facility] (used for more than 4 hours nightly for 23 days) -Patient benefits from use of CPAP (better quality of sleep) and should continue to use as instructed -Patient verbalized understanding -Coralie Keens, FNP-C

## 2013-08-25 ENCOUNTER — Other Ambulatory Visit: Payer: Self-pay | Admitting: General Practice

## 2013-08-25 ENCOUNTER — Telehealth: Payer: Self-pay | Admitting: Pharmacist

## 2013-08-25 MED ORDER — ENOXAPARIN SODIUM 150 MG/ML ~~LOC~~ SOLN: 150.0000 mg | Freq: Two times a day (BID) | SUBCUTANEOUS | Status: DC

## 2013-08-25 NOTE — Telephone Encounter (Signed)
Let Kathleen [name] know that I talked to [facility] and she does want to bridge [name] with Lovinox when she is off Coumadin for her laser ablation procedures.

## 2013-08-25 NOTE — Telephone Encounter (Signed)
Patient will need to hold warfarin starting 11/13 prior to procedure / lasar ablation on 11/17.  Will bridge with Lovenox 150mg  BID (based on 1mg /kg q12 h) starting 09/03/13. Per Marisue Ivan it will be OK for Mrs. Busbee to restart warfarin 11/18.   Then in prep for procedure on 12/08 patient will hold warfarin starting 12/03.  Will also bridge with Lovenox during this time.  appt given to patient for 08/30/13 to review plan.

## 2013-08-26 ENCOUNTER — Other Ambulatory Visit: Payer: Self-pay | Admitting: *Deleted

## 2013-08-26 ENCOUNTER — Encounter (HOSPITAL_BASED_OUTPATIENT_CLINIC_OR_DEPARTMENT_OTHER): Payer: Medicare Other | Attending: Internal Medicine

## 2013-08-26 DIAGNOSIS — Z7901 Long term (current) use of anticoagulants: Secondary | ICD-10-CM | POA: Insufficient documentation

## 2013-08-26 DIAGNOSIS — I83893 Varicose veins of bilateral lower extremities with other complications: Secondary | ICD-10-CM

## 2013-08-26 DIAGNOSIS — Z86711 Personal history of pulmonary embolism: Secondary | ICD-10-CM | POA: Insufficient documentation

## 2013-08-26 DIAGNOSIS — I87319 Chronic venous hypertension (idiopathic) with ulcer of unspecified lower extremity: Secondary | ICD-10-CM | POA: Insufficient documentation

## 2013-08-26 DIAGNOSIS — L97909 Non-pressure chronic ulcer of unspecified part of unspecified lower leg with unspecified severity: Secondary | ICD-10-CM | POA: Insufficient documentation

## 2013-08-27 NOTE — Progress Notes (Signed)
[facility]  NAME:  Kathleen [name], Kathleen [name]                 ACCOUNT NO.:  [id number]  MEDICAL RECORD NO.:  [id number]      DATE OF BIRTH:  [birth year]  PHYSICIAN:  Maxwell Caul, M.D.      VISIT DATE:                                  OFFICE VISIT   CHIEF COMPLAINT:  Review of blister on her anterior foot and a small wound on her left anterolateral leg.  This is a 67 year old lady with a longstanding history of chronic venous stasis with stasis dermatitis.  She has developed a blister on her right anterior foot, which is the first time she has ever had a wound related issue.  She saw Dr. Hart Rochester of Vascular Surgery.  She underwent a duplex ultrasound of the right leg which showed gross reflux through the right great saphenous systems, however no DVT.  She also has deep reflux in the left leg as well.  She is planned for bilateral great saphenous vein ablations starting with the right.  She has not had a history of a DVT that she is aware of, although she did have a pulmonary embolism 2 years ago, and has been on chronic Coumadin.  She apparently has had difficulty with elastic stockings, although she tells me that she has a Agricultural engineer who can come into her home to help with this on a daily basis.  PAST MEDICAL HISTORY:  Includes hypertension, hyperlipidemia, gastroesophageal reflux disease, sleep apnea, pulmonary embolism in 2012, diabetes, atrial fib, neurogenic bladder, congestive heart failure.  MEDICATION LIST:  Reviewed.  PHYSICAL EXAMINATION:  VITAL SIGNS:  Temperature is 98.8, pulse 76, respirations 18, blood pressure 143/81. WOUND EXAM:  The wound on her right lateral foot was superficial and it looks to be a resolving blister.  She has a small area on her left anterolateral leg.  There are changes of severe chronic bilateral venous stasis in both her lower extremities.  Her vascular assessment shows good dorsalis pedis pulses bilaterally.  Her ABIs  is calculated in this clinic was 1 bilaterally.  IMPRESSION:  Chronic venous stasis with venous hypertension and bilateral wounds as described above.  She already has a plan for greater saphenous vein ablation as planned by Dr. Hart Rochester.  This will start on the right and then, I believe, moved to the left side.  To her wounds, we applied silver alginate bilaterally, wrapped with a Unna Kerlix and Coban.  We applied TCA and zinc to the surrounding erythema to the wounds.  I do not really think that this will cause much challenge to get closed over.  The challenge will be maintaining closure in these areas going forward.  I think she will probably need graded pressure stockings even in spite of the fact that she is going to have saphenous vein ablation. Write her the scripts for this next week.  She tells me that she has somebody available to help her with putting these on.          ______________________________ Maxwell Caul, M.D.     MGR/MEDQ  D:  08/26/2013  T:  08/27/2013  Job:  [id number]

## 2013-08-30 ENCOUNTER — Ambulatory Visit (INDEPENDENT_AMBULATORY_CARE_PROVIDER_SITE_OTHER): Payer: Medicare Other | Admitting: Pharmacist

## 2013-08-30 VITALS — Ht 65.0 in | Wt 318.0 lb

## 2013-08-30 DIAGNOSIS — I2699 Other pulmonary embolism without acute cor pulmonale: Secondary | ICD-10-CM

## 2013-08-30 DIAGNOSIS — Z7901 Long term (current) use of anticoagulants: Secondary | ICD-10-CM

## 2013-08-31 ENCOUNTER — Encounter: Payer: Self-pay | Admitting: Pharmacist

## 2013-08-31 ENCOUNTER — Telehealth: Payer: Self-pay | Admitting: Pharmacist

## 2013-08-31 LAB — PROTIME-INR: Prothrombin Time: 27.3 s — ABNORMAL HIGH (ref 9.1–12.0)

## 2013-08-31 NOTE — Progress Notes (Signed)
CC:  Chronic anticoagulation due to history of PE.  HPI:  Kathleen [name] is currently taking warfarin for anticoagulation.  Recently she has been treated for cellulitis and venous insufficiency.  ABX therapy has lead to variable INR / protime reading recently.  Her last INR was 1.5 on (08/23/2013) but the previous week was 4.5 (08/19/2013).   She is schedule to have laser ablation of right great saphenous vein on 09/06/2013.  Per vascular surgeon she will need to stop warfarin at least 5 days prior to procedure.  Due to patients high risk of PE and that she will have limited mobility after procedure we will bridge with lovenox 150mg  q12 hours before and after procedure.  In December Kathleen [name] will have laser ablation left great saphenous and will follow same bridging plan as for the procedure 09/06/13.  Assessment: Chronic anticoagulation with need for Lovenox bridging prior to lasar ablation of right and left great saphenous vein.   Plan: lovenox 150mg  inject SQ q12h starting 09/02/13 and stop the day prior to procedure.  Resume the morning after procedure. Warfarin - take 1 tablet daily.  Plan to hold prior to lasar ablation starting 09/01/2013.  Resume the day following procedure.  Patient and her pastor are both instructed in how to administer Lovenox.  Her pastor will be administering Lovenox for her.  Protime drawn and sent out to [facility] today - pending. She will have Protime recheck 11/120/14.  Henrene Pastor, PharmD, CPP

## 2013-08-31 NOTE — Telephone Encounter (Signed)
Patient called about INR that was sent out to [facility] yesterday.  INR was 2.6. Recommend take 1 tablet of warfarin today and then start bridging plan given yesterday in office.

## 2013-09-01 ENCOUNTER — Other Ambulatory Visit: Payer: Self-pay | Admitting: *Deleted

## 2013-09-01 DIAGNOSIS — I83893 Varicose veins of bilateral lower extremities with other complications: Secondary | ICD-10-CM

## 2013-09-02 ENCOUNTER — Telehealth: Payer: Self-pay | Admitting: General Practice

## 2013-09-02 NOTE — Telephone Encounter (Signed)
Wants to make sure that advil is the same as ibuprofen.  She will take for just 1-2 days after her vein procedure on Monday 11/17.   Patient will be holding warfarin during this time and we will be checking INR closely during this period.

## 2013-09-03 ENCOUNTER — Encounter: Payer: Self-pay | Admitting: Vascular Surgery

## 2013-09-06 ENCOUNTER — Ambulatory Visit (INDEPENDENT_AMBULATORY_CARE_PROVIDER_SITE_OTHER): Payer: Medicare Other | Admitting: Vascular Surgery

## 2013-09-06 ENCOUNTER — Encounter: Payer: Self-pay | Admitting: Vascular Surgery

## 2013-09-06 VITALS — BP 161/77 | HR 93 | Resp 18 | Ht 65.0 in | Wt 315.0 lb

## 2013-09-06 DIAGNOSIS — I83893 Varicose veins of bilateral lower extremities with other complications: Secondary | ICD-10-CM

## 2013-09-06 NOTE — Progress Notes (Signed)
Subjective:     Patient ID: Kathleen [name], female   DOB: December 19, 1945, 67 y.o.   MRN: [id number]  HPI this 67 year old morbidly obese female had laser ablation right great saphenous vein performed under local tumescent anesthesia because of venous hypertension and venous ulcer. A total of 2005 J of energy was utilized and the patient tolerated the procedure well.   Review of Systems     Objective:   Physical Exam BP 161/77  Pulse 93  Resp 18  Ht 5\' 5"  (1.651 m)  Wt 315 lb (142.883 kg)  BMI 52.42 kg/m2       Assessment:     Well-tolerated laser ablation right great saphenous vein from proximal calf the saphenofemoral junction performed under local tumescent anesthesia    Plan:     Return in one week for venous duplex exam right leg to confirm closure right great saphenous vein Will then proceed with similar procedure contralateral left leg on December 8

## 2013-09-06 NOTE — Progress Notes (Signed)
   Laser Ablation Procedure      Date: 09/06/2013    Kathleen [name] DOB:[birth year]  Consent signed: Yes  Surgeon:J.D. Hart Rochester  Procedure: Laser Ablation: right Greater Saphenous Vein  BP 161/77  Pulse 93  Resp 18  Ht 5\' 5"  (1.651 m)  Wt 315 lb (142.883 kg)  BMI 52.42 kg/m2  Start time: 9:00   End time: 9:45  Tumescent Anesthesia: 440 cc 0.9% NaCl with 50 cc Lidocaine HCL with 1% Epi and 15 cc 8.4% NaHCO3  Local Anesthesia: 4 cc Lidocaine HCL and NaHCO3 (ratio 2:1)  Pulsed mode: 15 watts, 500 ms delay, 1.0 duration Total energy: 2005, total pulses: 134, total time: 2:14       Patient tolerated procedure well: Yes  Notes:   Description of Procedure:  After marking the course of the saphenous vein and the secondary varicosities in the standing position, the patient was placed on the operating table in the supine position, and the right leg was prepped and draped in sterile fashion. Local anesthetic was administered, and under ultrasound guidance the saphenous vein was accessed with a micro needle and guide wire; then the micro puncture sheath was placed. A guide wire was inserted to the saphenofemoral junction, followed by a 5 french sheath.  The position of the sheath and then the laser fiber below the junction was confirmed using the ultrasound and visualization of the aiming beam.  Tumescent anesthesia was administered along the course of the saphenous vein using ultrasound guidance. Protective laser glasses were placed on the patient, and the laser was fired at 15 watts puylsed mode  For a total of 2005 joules.  A steri strip was applied to the puncture site.    ABD pads and thigh high compression stockings were applied.  Ace wrap bandages were applied over the phlebectomy sites and at the top of the saphenofemoral junction.  Blood loss was less than 15 cc.  The patient ambulated out of the operating room having tolerated the procedure well.

## 2013-09-07 ENCOUNTER — Telehealth: Payer: Self-pay | Admitting: *Deleted

## 2013-09-07 ENCOUNTER — Other Ambulatory Visit: Payer: Self-pay | Admitting: Nurse Practitioner

## 2013-09-07 NOTE — Telephone Encounter (Signed)
Pt doing well. Not having any pain. Following all instructions. Reviewed her fu appts.

## 2013-09-08 ENCOUNTER — Telehealth: Payer: Self-pay | Admitting: Pharmacist

## 2013-09-08 ENCOUNTER — Other Ambulatory Visit (INDEPENDENT_AMBULATORY_CARE_PROVIDER_SITE_OTHER): Payer: Medicare Other

## 2013-09-08 DIAGNOSIS — Z86711 Personal history of pulmonary embolism: Secondary | ICD-10-CM

## 2013-09-08 NOTE — Telephone Encounter (Signed)
Patient came in for blood draw today and I discussed questions in office.  She specifically wanted to know if she could continue IBU 200mg  for the next 2-3 days since she was still feeling a little pain from her procedure form Monday 09/06/13.  I told her this was OK.

## 2013-09-08 NOTE — Progress Notes (Signed)
Labs only

## 2013-09-09 ENCOUNTER — Ambulatory Visit: Payer: Medicare Other | Admitting: Pharmacist

## 2013-09-09 ENCOUNTER — Telehealth: Payer: Self-pay | Admitting: General Practice

## 2013-09-09 LAB — PROTIME-INR
INR: 1.3 — ABNORMAL HIGH (ref 0.8–1.2)
Prothrombin Time: 13.6 s — ABNORMAL HIGH (ref 9.1–12.0)

## 2013-09-09 NOTE — Progress Notes (Signed)
Patient had INR drawn yesterday.  This encounter for reporting purposes only

## 2013-09-10 ENCOUNTER — Encounter: Payer: Self-pay | Admitting: Vascular Surgery

## 2013-09-13 ENCOUNTER — Ambulatory Visit (INDEPENDENT_AMBULATORY_CARE_PROVIDER_SITE_OTHER): Payer: Medicare Other | Admitting: Vascular Surgery

## 2013-09-13 ENCOUNTER — Ambulatory Visit (HOSPITAL_COMMUNITY)
Admission: RE | Admit: 2013-09-13 | Discharge: 2013-09-13 | Disposition: A | Discharge status: Home / Self Care | Payer: Medicare Other | Source: Ambulatory Visit | Attending: Vascular Surgery | Admitting: Vascular Surgery

## 2013-09-13 ENCOUNTER — Encounter: Payer: Self-pay | Admitting: Vascular Surgery

## 2013-09-13 ENCOUNTER — Encounter (HOSPITAL_COMMUNITY): Payer: Medicare Other

## 2013-09-13 VITALS — BP 123/67 | HR 99 | Resp 22 | Ht 65.0 in | Wt 312.0 lb

## 2013-09-13 DIAGNOSIS — I83893 Varicose veins of bilateral lower extremities with other complications: Secondary | ICD-10-CM | POA: Insufficient documentation

## 2013-09-13 DIAGNOSIS — L98499 Non-pressure chronic ulcer of skin of other sites with unspecified severity: Secondary | ICD-10-CM

## 2013-09-13 NOTE — Progress Notes (Signed)
Subjective:     Patient ID: Kathleen [name], female   DOB: Apr 18, 1946, 67 y.o.   MRN: [id number]  HPI this 67 year old female returns 1 week post laser ablation right great saphenous vein from proximal calf to the saphenofemoral junction. She had venous hypertension with venous stasis ulcer right ankle and severe skin changes lower third right leg. She has worn elastic compression stocking and an Radio broadcast assistant applied at the wound Center. She states the discomfort in the thigh is improving.  Past Medical History  Diagnosis Date  . HTN (hypertension)   . Hyperlipemia   . GERD (gastroesophageal reflux disease)   . Obesity   . Sleep apnea     she has not been on CPAP  . PE (pulmonary embolism) 05/2011  . DOE (dyspnea on exertion) 01/22/2012  . Hematuria 01/22/2012  . Diabetes mellitus without complication   . A-fib   . Neurogenic bladder   . CHF (congestive heart failure)   . Ulcer     both feet    History  Substance Use Topics  . Smoking status: Never Smoker   . Smokeless tobacco: Never Used  . Alcohol Use: No    Family History  Problem Relation Age of Onset  . Adopted: Yes    No Known Allergies  Current outpatient prescriptions:Amino Acids-Protein Hydrolys (PRO-STAT AWC) LIQD, TAKE 30 ML TWICE DAILY, Disp: 887 mL, Rfl: 2;  amiodarone (PACERONE) 200 MG tablet, TAKE 2 TABLETS ONCE A DAY, Disp: 60 tablet, Rfl: 3;  atorvastatin (LIPITOR) 20 MG tablet, Take 1 tablet (20 mg total) by mouth daily., Disp: 30 tablet, Rfl: 3 docusate sodium (COLACE) 100 MG capsule, Take 1 capsule (100 mg total) by mouth 2 (two) times daily., Disp: 60 capsule, Rfl: 3;  doxycycline (VIBRAMYCIN) 100 MG capsule, Take 1 capsule (100 mg total) by mouth 2 (two) times daily., Disp: 20 capsule, Rfl: 0;  enoxaparin (LOVENOX) 150 MG/ML injection, Inject 1 mL (150 mg total) into the skin every 12 (twelve) hours., Disp: 22 Syringe, Rfl: 0;  furosemide (LASIX) 40 MG tablet, , Disp: , Rfl:  ibuprofen (ADVIL,MOTRIN) 200 MG tablet,  Take 200 mg by mouth 3 (three) times daily., Disp: , Rfl: ;  ipratropium-albuterol (DUONEB) 0.5-2.5 (3) MG/3ML SOLN, Take 3 mLs by nebulization 3 (three) times daily., Disp: 360 mL, Rfl: 4;  mometasone (NASONEX) 50 MCG/ACT nasal spray, Place 2 sprays into the nose daily. Use 2 sprays in each nostril daily, Disp: , Rfl:  MYRBETRIQ 25 MG TB24 tablet, TAKE 2 TABLETS ONCE A DAY FOR NEUROGENIC BLADDER, Disp: 60 tablet, Rfl: 2;  polyethylene glycol powder (GLYCOLAX/MIRALAX) powder, Take 17 g by mouth 2 (two) times daily., Disp: 1054 g, Rfl: 4;  potassium chloride SA (K-DUR,KLOR-CON) 20 MEQ tablet, DISSOLVE & TAKE 1 TABLET IN WATER 2 TIMES A DAY FOR POTASSIUM SUPPLEMENT, Disp: 60 tablet, Rfl: 3 predniSONE (DELTASONE) 5 MG tablet, TAKE 1 TABLET ONCE A DAY, Disp: 30 tablet, Rfl: 3;  torsemide (DEMADEX) 20 MG tablet, Take 1.5 tablets (30 mg total) by mouth 2 (two) times daily., Disp: 90 tablet, Rfl: 3;  vitamin C (ASCORBIC ACID) 500 MG tablet, Take 500 mg by mouth daily. Take 2 tablets 2 times daily, Disp: , Rfl: ;  warfarin (COUMADIN) 3 MG tablet, TAKE 1 OR 2 TABLETS ONCE DAILY AS DIRECTED, Disp: 60 tablet, Rfl: 2  BP 123/67  Pulse 99  Resp 22  Ht 5\' 5"  (1.651 m)  Wt 312 lb (141.522 kg)  BMI 51.92 kg/m2  Body  mass index is 51.92 kg/(m^2).           Review of Systems denies chest pain, hemoptysis. Does have chronic dyspnea on exertion.     Objective:   Physical Exam BP 123/67  Pulse 99  Resp 22  Ht 5\' 5"  (1.651 m)  Wt 312 lb (141.522 kg)  BMI 51.92 kg/m2  General morbidly obese female in no apparent stress alert and oriented x3 Lungs no rhonchi or wheezing Right leg with mild tenderness and thigh along the course of great saphenous vein. Ulceration and right pre-tibial region is dry and measures a centimeter in diameter. 1+ chronic edema with 3+ dorsalis pedis pulse palpable.  Today I ordered a venous duplex exam of the right leg which are reviewed and interpreted. There is no DVT in the  right great saphenous vein is closed up to near the saphenofemoral junction. Also ordered a venous duplex exam of the left leg which are reviewed and interpreted. There is gross reflux in the left great saphenous vein from the knee to the saphenofemoral junction with no DVT.     Assessment:     Successful well-tolerated laser ablation right great saphenous vein with venous stasis ulcer due to venous hypertension and severe skin changes Gross reflux left great saphenous vein with stasis ulcer and chronic edema and skin changes    Plan:     Plan laser ablation left great saphenous vein on December 8 and will then refer patient back to wound center

## 2013-09-14 ENCOUNTER — Other Ambulatory Visit (INDEPENDENT_AMBULATORY_CARE_PROVIDER_SITE_OTHER): Payer: Medicare Other

## 2013-09-14 ENCOUNTER — Other Ambulatory Visit: Payer: Self-pay

## 2013-09-14 DIAGNOSIS — I2699 Other pulmonary embolism without acute cor pulmonale: Secondary | ICD-10-CM

## 2013-09-15 ENCOUNTER — Telehealth: Payer: Self-pay | Admitting: Pharmacist

## 2013-09-15 ENCOUNTER — Ambulatory Visit (INDEPENDENT_AMBULATORY_CARE_PROVIDER_SITE_OTHER): Payer: Self-pay | Admitting: Pharmacist

## 2013-09-15 LAB — PROTIME-INR
INR: 2 — ABNORMAL HIGH (ref 0.8–1.2)
Prothrombin Time: 20.2 s — ABNORMAL HIGH (ref 9.1–12.0)

## 2013-09-15 NOTE — Telephone Encounter (Signed)
Patient called with INR results and warfarin dosing.  See anticoagulation notes.

## 2013-09-19 DIAGNOSIS — N182 Chronic kidney disease, stage 2 (mild): Secondary | ICD-10-CM

## 2013-09-19 DIAGNOSIS — I509 Heart failure, unspecified: Secondary | ICD-10-CM

## 2013-09-19 DIAGNOSIS — E119 Type 2 diabetes mellitus without complications: Secondary | ICD-10-CM

## 2013-09-19 DIAGNOSIS — I4891 Unspecified atrial fibrillation: Secondary | ICD-10-CM

## 2013-09-20 ENCOUNTER — Telehealth: Payer: Self-pay | Admitting: Pharmacist

## 2013-09-20 NOTE — Telephone Encounter (Signed)
Patient states that she feels queasy and like she has something stuck in her throat that she cannot cough up.  She has already spoken with triage nurse and has appt tomorrow with Philomena Doheny, NP.   I told patient that if she feels the need she can come in this afternoon.  She doesn't currently have anyone to bring her but she will call if she wants to come in today instead of waiting until tomorrow.

## 2013-09-21 ENCOUNTER — Ambulatory Visit (HOSPITAL_COMMUNITY)
Admission: RE | Admit: 2013-09-21 | Discharge: 2013-09-21 | Disposition: A | Discharge status: Home / Self Care | Payer: Medicare Other | Source: Ambulatory Visit | Attending: General Practice | Admitting: General Practice

## 2013-09-21 ENCOUNTER — Ambulatory Visit (INDEPENDENT_AMBULATORY_CARE_PROVIDER_SITE_OTHER): Payer: Medicare Other | Admitting: General Practice

## 2013-09-21 ENCOUNTER — Ambulatory Visit: Payer: Medicare Other

## 2013-09-21 ENCOUNTER — Encounter: Payer: Self-pay | Admitting: General Practice

## 2013-09-21 ENCOUNTER — Ambulatory Visit (INDEPENDENT_AMBULATORY_CARE_PROVIDER_SITE_OTHER): Payer: Medicare Other

## 2013-09-21 VITALS — BP 104/62 | HR 67 | Temp 97.9°F | Ht 65.0 in | Wt 322.5 lb

## 2013-09-21 DIAGNOSIS — R0602 Shortness of breath: Secondary | ICD-10-CM

## 2013-09-21 DIAGNOSIS — J9819 Other pulmonary collapse: Secondary | ICD-10-CM | POA: Insufficient documentation

## 2013-09-21 DIAGNOSIS — R0609 Other forms of dyspnea: Secondary | ICD-10-CM | POA: Insufficient documentation

## 2013-09-21 DIAGNOSIS — R42 Dizziness and giddiness: Secondary | ICD-10-CM

## 2013-09-21 DIAGNOSIS — R0989 Other specified symptoms and signs involving the circulatory and respiratory systems: Secondary | ICD-10-CM | POA: Insufficient documentation

## 2013-09-21 DIAGNOSIS — R9431 Abnormal electrocardiogram [ECG] [EKG]: Secondary | ICD-10-CM

## 2013-09-21 MED ORDER — IOHEXOL 350 MG/ML SOLN
100.0000 mL | Freq: Once | INTRAVENOUS | Status: AC | PRN
  Administered 2013-09-21: 100 mL via INTRAVENOUS

## 2013-09-21 NOTE — Progress Notes (Signed)
   Subjective:    Patient ID: Kathleen [name], female    DOB: Mar 28, 1946, 67 y.o.   MRN: [id number]  Shortness of Breath This is a new problem. The current episode started yesterday. The problem has been gradually improving. Pertinent negatives include no chest pain, fever, headaches or sputum production. The symptoms are aggravated by any activity. She has tried ipratropium inhalers for the symptoms.      Review of Systems  Constitutional: Negative for fever and chills.  Respiratory: Positive for cough and shortness of breath. Negative for sputum production and chest tightness.   Cardiovascular: Negative for chest pain and palpitations.  Genitourinary: Negative for difficulty urinating.  Neurological: Negative for dizziness, weakness and headaches.       Objective:   Physical Exam  Constitutional: She is oriented to person, place, and time. She appears well-developed and well-nourished.  Cardiovascular: Normal rate, regular rhythm and normal heart sounds.   Pulmonary/Chest: No respiratory distress. She has no wheezes. She exhibits no tenderness.  Diminished breath sounds in bases  Neurological: She is alert and oriented to person, place, and time.  Skin: Skin is warm and dry.  Psychiatric: She has a normal mood and affect.   [facility] reading (PRIMARY) by Coralie Keens, FNP-C, no acute changes noted.       Assessment & Plan:  1. Shortness of breath  - EKG 12-Lead - DG Chest 2 View; Future - CT Chest Wo Contrast; Future (scheduled for 3pm at [facility] radiology) -Patient and caregiver verbalized understanding of appointment for CT scan -oxygen saturation increased to 94% on room air  2. Dizziness   3. Abnormal EKG  - Ambulatory referral to Cardiology -seek emergency medical treatment if symptoms worsen Patient verbalized understanding Coralie Keens, FNP-C

## 2013-09-21 NOTE — Patient Instructions (Signed)

## 2013-09-22 ENCOUNTER — Other Ambulatory Visit: Payer: Self-pay | Admitting: General Practice

## 2013-09-23 ENCOUNTER — Encounter (HOSPITAL_BASED_OUTPATIENT_CLINIC_OR_DEPARTMENT_OTHER): Payer: Medicare Other | Attending: Internal Medicine

## 2013-09-23 DIAGNOSIS — L97909 Non-pressure chronic ulcer of unspecified part of unspecified lower leg with unspecified severity: Secondary | ICD-10-CM | POA: Insufficient documentation

## 2013-09-23 DIAGNOSIS — I87319 Chronic venous hypertension (idiopathic) with ulcer of unspecified lower extremity: Secondary | ICD-10-CM | POA: Insufficient documentation

## 2013-09-24 ENCOUNTER — Encounter: Payer: Self-pay | Admitting: Vascular Surgery

## 2013-09-24 ENCOUNTER — Ambulatory Visit (INDEPENDENT_AMBULATORY_CARE_PROVIDER_SITE_OTHER): Payer: Medicare Other | Admitting: Cardiovascular Disease

## 2013-09-24 ENCOUNTER — Encounter: Payer: Self-pay | Admitting: *Deleted

## 2013-09-24 ENCOUNTER — Encounter: Payer: Self-pay | Admitting: Cardiovascular Disease

## 2013-09-24 VITALS — BP 129/74 | HR 76 | Ht 64.0 in | Wt 324.0 lb

## 2013-09-24 DIAGNOSIS — I4891 Unspecified atrial fibrillation: Secondary | ICD-10-CM

## 2013-09-24 DIAGNOSIS — Z86711 Personal history of pulmonary embolism: Secondary | ICD-10-CM

## 2013-09-24 DIAGNOSIS — I10 Essential (primary) hypertension: Secondary | ICD-10-CM

## 2013-09-24 DIAGNOSIS — R0609 Other forms of dyspnea: Secondary | ICD-10-CM

## 2013-09-24 DIAGNOSIS — R9431 Abnormal electrocardiogram [ECG] [EKG]: Secondary | ICD-10-CM

## 2013-09-24 NOTE — Progress Notes (Signed)
Patient ID: Kathleen [name], female   DOB: April 30, 1946, 67 y.o.   MRN: [id number]       CARDIOLOGY CONSULT NOTE  Patient ID: Kathleen [name] MRN: [id number] DOB/AGE: Mar 10, 1946 67 y.o.  Admit date: (Not on file) Primary Physician Rudi Heap, MD  Reason for Consultation: DOE, abnormal ECG  HPI: The patient is a 67 year old woman with a past medical history significant for atrial fibrillation, pulmonary embolism, COPD, hypertension, morbid obesity and venous insufficiency as well as hyperlipidemia, sleep apnea, and diabetes. An echocardiogram from April 2013 revealed normal left ventricular systolic function with an ejection fraction of 50-55% and mild aortic regurgitation. She has been experiencing dyspnea with exertion. An ECG performed showed a right bundle branch block with a right axis.  Chest xray showed: Cardiomegaly. Bilateral lower lung interstitial opacities may represent atelectasis. Infection not excluded. Small pleural effusions.  A CT angiogram of the chest showed no evidence of pulmonary embolism and bibasilar atelectasis.  Approximately one week ago, she experienced some nausea and "didn't feel quite right". She had some intermittent dizziness but denied syncope. She denies chest pain and vomiting. She had been short of breath with minimal exertion on that day. However since this time, she has had no further recurrences. She denies any chest pain over the past year. She has obesity hypoventilation syndrome and uses oxygen chronically. She also has sleep apnea and uses CPAP at night. She had venous ablation of her lower extremity and is scheduled to undergo the same for her left leg in the near future.  She sleeps in a chair due to long-standing orthopnea.    No Known Allergies  Current Outpatient Prescriptions  Medication Sig Dispense Refill  . Amino Acids-Protein Hydrolys (PRO-STAT AWC) LIQD TAKE 30 ML TWICE DAILY  887 mL  2  . amiodarone (PACERONE) 200 MG tablet  TAKE 2 TABLETS ONCE A DAY  60 tablet  3  . Ascorbic Acid (VITAMIN C) 1000 MG tablet Take 1,000 mg by mouth 2 (two) times daily.      Marland Kitchen atorvastatin (LIPITOR) 20 MG tablet Take 1 tablet (20 mg total) by mouth daily.  30 tablet  3  . docusate sodium (COLACE) 100 MG capsule Take 1 capsule (100 mg total) by mouth 2 (two) times daily.  60 capsule  3  . enoxaparin (LOVENOX) 150 MG/ML injection Inject 1 mL (150 mg total) into the skin every 12 (twelve) hours.  22 Syringe  0  . ibuprofen (ADVIL,MOTRIN) 200 MG tablet Take 200 mg by mouth 3 (three) times daily.      Marland Kitchen ipratropium-albuterol (DUONEB) 0.5-2.5 (3) MG/3ML SOLN Take 3 mLs by nebulization 3 (three) times daily.  360 mL  4  . MYRBETRIQ 25 MG TB24 tablet TAKE 2 TABLETS ONCE A DAY FOR NEUROGENIC BLADDER  60 tablet  2  . polyethylene glycol powder (GLYCOLAX/MIRALAX) powder Take 17 g by mouth 2 (two) times daily.  1054 g  4  . potassium chloride SA (K-DUR,KLOR-CON) 20 MEQ tablet DISSOLVE & TAKE 1 TABLET IN WATER 2 TIMES A DAY FOR POTASSIUM SUPPLEMENT  60 tablet  3  . predniSONE (DELTASONE) 5 MG tablet TAKE 1 TABLET ONCE A DAY  30 tablet  3  . torsemide (DEMADEX) 20 MG tablet Take 20 mg by mouth daily.      Marland Kitchen warfarin (COUMADIN) 3 MG tablet TAKE 1 OR 2 TABLETS ONCE DAILY AS DIRECTED  60 tablet  2   No current facility-administered medications for this visit.  Past Medical History  Diagnosis Date  . HTN (hypertension)   . Hyperlipemia   . GERD (gastroesophageal reflux disease)   . Obesity   . Sleep apnea     she has not been on CPAP  . PE (pulmonary embolism) 05/2011  . DOE (dyspnea on exertion) 01/22/2012  . Hematuria 01/22/2012  . Diabetes mellitus without complication   . A-fib   . Neurogenic bladder   . CHF (congestive heart failure)   . Ulcer     both feet    Past Surgical History  Procedure Laterality Date  . Total abdominal hysterectomy w/ bilateral salpingoophorectomy      History   Social History  . Marital Status: Widowed      Spouse Name: N/A    Number of Children: 0  . Years of Education: N/A   Occupational History  .      retired Education officer, environmental   Social History Main Topics  . Smoking status: Never Smoker   . Smokeless tobacco: Never Used  . Alcohol Use: No  . Drug Use: No  . Sexual Activity: No   Other Topics Concern  . Not on file   Social History Narrative  . No narrative on file     Family History  Problem Relation Age of Onset  . Adopted: Yes     Prior to Admission medications   Medication Sig Start Date End Date Taking? Authorizing Provider  Amino Acids-Protein Hydrolys (PRO-STAT AWC) LIQD TAKE 30 ML TWICE DAILY 09/07/13  Yes Coralie Keens, FNP  amiodarone (PACERONE) 200 MG tablet TAKE 2 TABLETS ONCE A DAY 08/25/13  Yes Mae Shelda Altes, FNP  Ascorbic Acid (VITAMIN C) 1000 MG tablet Take 1,000 mg by mouth 2 (two) times daily.   Yes Historical Provider, MD  atorvastatin (LIPITOR) 20 MG tablet Take 1 tablet (20 mg total) by mouth daily. 08/10/13  Yes Mae Shelda Altes, FNP  docusate sodium (COLACE) 100 MG capsule Take 1 capsule (100 mg total) by mouth 2 (two) times daily. 06/01/13  Yes Mae Shelda Altes, FNP  enoxaparin (LOVENOX) 150 MG/ML injection Inject 1 mL (150 mg total) into the skin every 12 (twelve) hours. 08/25/13  Yes Tammy Eckard, PHARMD  ibuprofen (ADVIL,MOTRIN) 200 MG tablet Take 200 mg by mouth 3 (three) times daily.   Yes Historical Provider, MD  ipratropium-albuterol (DUONEB) 0.5-2.5 (3) MG/3ML SOLN Take 3 mLs by nebulization 3 (three) times daily. 07/19/13  Yes Mae E Haliburton, FNP  MYRBETRIQ 25 MG TB24 tablet TAKE 2 TABLETS ONCE A DAY FOR NEUROGENIC BLADDER 07/26/13  Yes Mae Shelda Altes, FNP  polyethylene glycol powder (GLYCOLAX/MIRALAX) powder Take 17 g by mouth 2 (two) times daily. 07/19/13  Yes Mae Shelda Altes, FNP  potassium chloride SA (K-DUR,KLOR-CON) 20 MEQ tablet DISSOLVE & TAKE 1 TABLET IN WATER 2 TIMES A DAY FOR POTASSIUM SUPPLEMENT 08/25/13  Yes Mae Shelda Altes, FNP   predniSONE (DELTASONE) 5 MG tablet TAKE 1 TABLET ONCE A DAY 08/25/13  Yes Mae Shelda Altes, FNP  torsemide (DEMADEX) 20 MG tablet Take 20 mg by mouth daily. 08/12/13  Yes Doree Albee, MD  warfarin (COUMADIN) 3 MG tablet TAKE 1 OR 2 TABLETS ONCE DAILY AS DIRECTED 08/16/13  Yes Coralie Keens, FNP     Review of systems complete and found to be negative unless listed above in HPI     Physical exam Blood pressure 129/74, pulse 76, height 5\' 4"  (1.626 m), weight 324 lb (146.965 kg), SpO2 96.00%. General:  NAD, morbidly obese Neck: No JVD, no thyromegaly or thyroid nodule.  Lungs: Clear to auscultation bilaterally with normal respiratory effort. CV: Nondisplaced PMI.  Heart regular S1/S2, no S3/S4, I/VI pansystolic murmur along left sternal border. 1-2+ leg edema bilaterally but wearing wraps.  No carotid bruit.   Abdomen: Soft, obese, nontender. Skin: Intact without lesions or rashes.  Neurologic: Alert and oriented x 3.  Psych: Normal affect. Extremities: No clubbing or cyanosis.  HEENT: Normal.   Labs:   Lab Results  Component Value Date   WBC 6.7 08/04/2013   HGB 14.6 08/04/2013   HCT 44.2 08/04/2013   MCV 99.8* 08/04/2013   PLT 111* 12/07/2012   No results found for this basename: NA, K, CL, CO2, BUN, CREATININE, CALCIUM, LABALBU, PROT, BILITOT, ALKPHOS, ALT, AST, GLUCOSE,  in the last 168 hours Lab Results  Component Value Date   CKTOTAL 245* 06/19/2011   CKMB 7.4* 06/19/2011   TROPONINI <0.30 11/13/2012    Lab Results  Component Value Date   CHOL 301* 08/04/2013   CHOL  Value: 116        ATP III CLASSIFICATION:  <200     mg/dL   Desirable  161-096  mg/dL   Borderline High  >=045    mg/dL   High 4/0/9811   Lab Results  Component Value Date   HDL 53 03/24/2007   Lab Results  Component Value Date   LDLCALC 217* 04/22/2013   LDLCALC  Value: 58        Total Cholesterol/HDL:CHD Risk Coronary Heart Disease Risk Table                     Men   Women  1/2 Average Risk   3.4    3.3 03/24/2007   Lab Results  Component Value Date   TRIG 411* 04/22/2013   TRIG 27 03/24/2007   Lab Results  Component Value Date   CHOLHDL 2.2 03/24/2007   No results found for this basename: LDLDIRECT       EKG: see HPI  Studies: See HPI  ASSESSMENT AND PLAN: 1. Dyspnea on exertion: This isolated episode of dyspnea on exertion with nausea is of uncertain etiology. She has several risk factors for coronary artery disease. In order to rule out ischemic heart disease, I will proceed with a Lexiscan Cardiolite nuclear stress test. I wiill also obtain an echocardiogram to evaluate for any evidence of structural heart disease, and to see if there has been an interval change in her systolic function. 2. Atrial fibrillation: currently in sinus rhythm and on amiodarone and warfarin. 3. H/o pulmonary embolism: no evidence of PE on recent CT angiogram. 4. HTN: controlled on present therapy.  Signed: Prentice Docker, M.D., F.A.C.C.  09/24/2013, 11:57 AM

## 2013-09-24 NOTE — Patient Instructions (Signed)
Your physician has requested that you have an echocardiogram. Echocardiography is a painless test that uses sound waves to create images of your heart. It provides your doctor with information about the size and shape of your heart and how well your heart's chambers and valves are working. This procedure takes approximately one hour. There are no restrictions for this procedure. Your physician has requested that you have a lexiscan myoview. For further information please visit [email or web address] Please follow instruction sheet, as given. Continue all current medications. Office will contact with results via phone or letter.   Follow up in  6-8 weeks

## 2013-09-27 ENCOUNTER — Ambulatory Visit (INDEPENDENT_AMBULATORY_CARE_PROVIDER_SITE_OTHER): Payer: Medicare Other | Admitting: Vascular Surgery

## 2013-09-27 ENCOUNTER — Telehealth: Payer: Self-pay | Admitting: Pharmacist

## 2013-09-27 ENCOUNTER — Encounter: Payer: Self-pay | Admitting: Vascular Surgery

## 2013-09-27 VITALS — BP 126/74 | HR 79 | Resp 16 | Ht 64.5 in | Wt 324.0 lb

## 2013-09-27 DIAGNOSIS — I83893 Varicose veins of bilateral lower extremities with other complications: Secondary | ICD-10-CM

## 2013-09-27 NOTE — Progress Notes (Signed)
   Laser Ablation Procedure      Date: 09/27/2013    Kathleen [name] DOB:23-Feb-1946  Consent signed: Yes  Surgeon:J.D. Hart [place]  Procedure: Laser Ablation: left Greater Saphenous Vein  BP 126/74  Pulse 79  Resp 16  Ht 5' 4.5" (1.638 m)  Wt 324 lb (146.965 kg)  BMI 54.78 kg/m2  Start time: 1:15   End time: 2:10  Tumescent Anesthesia: 400 cc 0.9% NaCl with 50 cc Lidocaine HCL with 1% Epi and 15 cc 8.4% NaHCO3  Local Anesthesia: 5 cc Lidocaine HCL and NaHCO3 (ratio 2:1)  Pulsed mode: 15 watts, 500 ms delay, 1.0 duration Total energy: 2305, total pulses: 154, total time: 2:34     Patient tolerated procedure well: Yes  Notes:   Description of Procedure:  After marking the course of the saphenous vein and the secondary varicosities in the standing position, the patient was placed on the operating table in the supine position, and the left leg was prepped and draped in sterile fashion. Local anesthetic was administered, and under ultrasound guidance the saphenous vein was accessed with a micro needle and guide wire; then the micro puncture sheath was placed. A guide wire was inserted to the saphenofemoral junction, followed by a 5 french sheath.  The position of the sheath and then the laser fiber below the junction was confirmed using the ultrasound and visualization of the aiming beam.  Tumescent anesthesia was administered along the course of the saphenous vein using ultrasound guidance. Protective laser glasses were placed on the patient, and the laser was fired at 15 watts pulsed mode.  For a total of 2305 joules.  A steri strip was applied to the puncture site.   ABD pads and thigh high compression stockings were applied.  Ace wrap bandages were applied over the phlebectomy sites and at the top of the saphenofemoral junction.  Blood loss was less than 15 cc.  The patient ambulated out of the operating room having tolerated the procedure well.

## 2013-09-27 NOTE — Telephone Encounter (Signed)
Patient calls today concerned that Lovenox she has taken is causing itching and redness.  Denies and SOB or breathing difficulties.   Today she is have a vein procedure and is not due to have lovenox today but is suppose to restart tomorrow.  Instructed patient not to restart lovenox tomorrow - will just restart warfarin as instructed.  She is to have INR check Wednesday 09/29/13. Patient took a Benadryl last night and I said it would also be ok to take a Benadryl today as well.

## 2013-09-27 NOTE — Progress Notes (Signed)
Subjective:     Patient ID: Kathleen [name], female   DOB: 02-03-1946, 67 y.o.   MRN: [id number]  HPI this 67 year old female had laser ablation left great saphenous vein performed under local tumescent anesthesia for severe venous hypertension with chronic skin changes and history of stasis ulcer. She tolerated the procedure well. A total of 2300 J of energy was utilized.  Review of Systems     Objective:   Physical Exam BP 126/74  Pulse 79  Resp 16  Ht 5' 4.5" (1.638 m)  Wt 324 lb (146.965 kg)  BMI 54.78 kg/m2        Assessment:    well-tolerated laser ablation left great saphenous vein from proximal Saphenofemoral junction performed under local tumescent anesthesia    Plan:     Return in one week for venous duplex exam to confirm closure left great saphenous

## 2013-09-28 ENCOUNTER — Telehealth: Payer: Self-pay | Admitting: *Deleted

## 2013-09-28 ENCOUNTER — Encounter: Payer: Self-pay | Admitting: Vascular Surgery

## 2013-09-28 NOTE — Telephone Encounter (Signed)
Pt doing well except for the wraps which are coming apart. No pain and following all instructions. Reminded her of her fu appts next week.

## 2013-09-29 ENCOUNTER — Other Ambulatory Visit (INDEPENDENT_AMBULATORY_CARE_PROVIDER_SITE_OTHER): Payer: Medicare Other

## 2013-09-29 DIAGNOSIS — I2699 Other pulmonary embolism without acute cor pulmonale: Secondary | ICD-10-CM

## 2013-09-29 NOTE — Telephone Encounter (Signed)
Patient instructed to take 2 warfarin tablets today (= 6mg  total).  She had INR drawn today but we have to send hers out to [facility] and results are not yet available - will call tomorrow.

## 2013-09-30 ENCOUNTER — Ambulatory Visit (INDEPENDENT_AMBULATORY_CARE_PROVIDER_SITE_OTHER): Payer: Medicare Other | Admitting: Pharmacist

## 2013-09-30 DIAGNOSIS — I2699 Other pulmonary embolism without acute cor pulmonale: Secondary | ICD-10-CM

## 2013-09-30 DIAGNOSIS — Z7901 Long term (current) use of anticoagulants: Secondary | ICD-10-CM

## 2013-10-01 ENCOUNTER — Encounter: Payer: Self-pay | Admitting: Vascular Surgery

## 2013-10-04 ENCOUNTER — Ambulatory Visit (INDEPENDENT_AMBULATORY_CARE_PROVIDER_SITE_OTHER): Payer: Medicare Other | Admitting: Vascular Surgery

## 2013-10-04 ENCOUNTER — Encounter: Payer: Self-pay | Admitting: Vascular Surgery

## 2013-10-04 ENCOUNTER — Ambulatory Visit (HOSPITAL_COMMUNITY)
Admission: RE | Admit: 2013-10-04 | Discharge: 2013-10-04 | Disposition: A | Discharge status: Home / Self Care | Payer: Medicare Other | Source: Ambulatory Visit | Attending: Vascular Surgery | Admitting: Vascular Surgery

## 2013-10-04 ENCOUNTER — Other Ambulatory Visit: Payer: Self-pay | Admitting: Vascular Surgery

## 2013-10-04 VITALS — BP 118/72 | HR 98 | Resp 16 | Ht 65.0 in | Wt 325.0 lb

## 2013-10-04 DIAGNOSIS — I83893 Varicose veins of bilateral lower extremities with other complications: Secondary | ICD-10-CM

## 2013-10-04 NOTE — Progress Notes (Signed)
Subjective:     Patient ID: Kathleen [name], female   DOB: 1945/11/18, 67 y.o.   MRN: [id number]  HPI this 67 year old female returns 1 week post laser ablation left great saphenous vein for venous hypertension with a history of venous stasis ulcers. She is morbidly obese. Her legs were wrapped with compression wraps rather than a long leg elastic stocking because of inability to wear long leg stocking. Her CMA who helps care for her at home did not change the wraps after 2 days as instructed. She has had some pain in this area from the ablation.  Past Medical History  Diagnosis Date  . HTN (hypertension)   . Hyperlipemia   . GERD (gastroesophageal reflux disease)   . Obesity   . Sleep apnea     she has not been on CPAP  . PE (pulmonary embolism) 05/2011  . DOE (dyspnea on exertion) 01/22/2012  . Hematuria 01/22/2012  . Diabetes mellitus without complication   . A-fib   . Neurogenic bladder   . CHF (congestive heart failure)   . Ulcer     both feet    History  Substance Use Topics  . Smoking status: Never Smoker   . Smokeless tobacco: Never Used  . Alcohol Use: No    Family History  Problem Relation Age of Onset  . Adopted: Yes    Allergies  Allergen Reactions  . Lovenox [Enoxaparin Sodium]     Itching, reddness    Current outpatient prescriptions:Amino Acids-Protein Hydrolys (PRO-STAT AWC) LIQD, TAKE 30 ML TWICE DAILY, Disp: 887 mL, Rfl: 2;  amiodarone (PACERONE) 200 MG tablet, TAKE 2 TABLETS ONCE A DAY, Disp: 60 tablet, Rfl: 3;  Ascorbic Acid (VITAMIN C) 1000 MG tablet, Take 1,000 mg by mouth 2 (two) times daily., Disp: , Rfl: ;  atorvastatin (LIPITOR) 20 MG tablet, Take 1 tablet (20 mg total) by mouth daily., Disp: 30 tablet, Rfl: 3 docusate sodium (COLACE) 100 MG capsule, Take 1 capsule (100 mg total) by mouth 2 (two) times daily., Disp: 60 capsule, Rfl: 3;  enoxaparin (LOVENOX) 150 MG/ML injection, Inject 1 mL (150 mg total) into the skin every 12 (twelve) hours., Disp: 22  Syringe, Rfl: 0;  ibuprofen (ADVIL,MOTRIN) 200 MG tablet, Take 200 mg by mouth 3 (three) times daily., Disp: , Rfl:  ipratropium-albuterol (DUONEB) 0.5-2.5 (3) MG/3ML SOLN, Take 3 mLs by nebulization 3 (three) times daily., Disp: 360 mL, Rfl: 4;  MYRBETRIQ 25 MG TB24 tablet, TAKE 2 TABLETS ONCE A DAY FOR NEUROGENIC BLADDER, Disp: 60 tablet, Rfl: 2;  polyethylene glycol powder (GLYCOLAX/MIRALAX) powder, Take 17 g by mouth 2 (two) times daily., Disp: 1054 g, Rfl: 4 potassium chloride SA (K-DUR,KLOR-CON) 20 MEQ tablet, DISSOLVE & TAKE 1 TABLET IN WATER 2 TIMES A DAY FOR POTASSIUM SUPPLEMENT, Disp: 60 tablet, Rfl: 3;  predniSONE (DELTASONE) 5 MG tablet, TAKE 1 TABLET ONCE A DAY, Disp: 30 tablet, Rfl: 3;  torsemide (DEMADEX) 20 MG tablet, Take 20 mg by mouth daily., Disp: , Rfl: ;  warfarin (COUMADIN) 3 MG tablet, TAKE 1 OR 2 TABLETS ONCE DAILY AS DIRECTED, Disp: 60 tablet, Rfl: 2  BP 118/72  Pulse 98  Resp 16  Ht 5\' 5"  (1.651 m)  Wt 325 lb (147.419 kg)  BMI 54.08 kg/m2  Body mass index is 54.08 kg/(m^2).           Review of Systems denies chest pain, dyspnea on exertion, PND, orthopnea, hemoptysis    Objective:   Physical Exam BP 118/72  Pulse 98  Resp 16  Ht 5\' 5"  (1.651 m)  Wt 325 lb (147.419 kg)  BMI 54.08 kg/m2  General morbidly obese female in no apparent distress alert and oriented x3 Lungs no rhonchi or wheezing Left lower extremity has large panniculus overhanging the groin area to mid thigh. The proximal thigh has erythema from the compression wraps which were not removed as instructed. There is no purulent drainage. No active ulcer noted distally.  Today I ordered a venous duplex exam of the left leg which are viewed and interpreted. The great saphenous vein is totally closed up to near the saphenofemoral junction but there is a anterior accessory branch which is patent.      Assessment:     Successful bilateral great saphenous vein ablation this 4 history venous stasis  ulcers and edema bilaterally    Plan:     If patient developed recurrent ulcer she will contact wound Center for further treatment. No further venous procedures indicated

## 2013-10-05 ENCOUNTER — Other Ambulatory Visit (INDEPENDENT_AMBULATORY_CARE_PROVIDER_SITE_OTHER): Payer: Medicare Other

## 2013-10-05 DIAGNOSIS — Z7901 Long term (current) use of anticoagulants: Secondary | ICD-10-CM

## 2013-10-05 DIAGNOSIS — I2699 Other pulmonary embolism without acute cor pulmonale: Secondary | ICD-10-CM

## 2013-10-05 NOTE — Progress Notes (Signed)
Pt came in for labs only 

## 2013-10-06 ENCOUNTER — Encounter (HOSPITAL_COMMUNITY)
Admission: RE | Admit: 2013-10-06 | Discharge: 2013-10-06 | Disposition: A | Discharge status: Home / Self Care | Payer: Medicare Other | Source: Ambulatory Visit | Attending: Cardiovascular Disease | Admitting: Cardiovascular Disease

## 2013-10-06 ENCOUNTER — Ambulatory Visit (HOSPITAL_COMMUNITY)
Admission: RE | Admit: 2013-10-06 | Discharge: 2013-10-06 | Disposition: A | Discharge status: Home / Self Care | Payer: Medicare Other | Source: Ambulatory Visit | Attending: Cardiovascular Disease | Admitting: Cardiovascular Disease

## 2013-10-06 ENCOUNTER — Ambulatory Visit (INDEPENDENT_AMBULATORY_CARE_PROVIDER_SITE_OTHER): Payer: Medicare Other | Admitting: Pharmacist

## 2013-10-06 ENCOUNTER — Encounter (HOSPITAL_COMMUNITY): Payer: Self-pay

## 2013-10-06 DIAGNOSIS — E785 Hyperlipidemia, unspecified: Secondary | ICD-10-CM | POA: Insufficient documentation

## 2013-10-06 DIAGNOSIS — I509 Heart failure, unspecified: Secondary | ICD-10-CM | POA: Insufficient documentation

## 2013-10-06 DIAGNOSIS — I2699 Other pulmonary embolism without acute cor pulmonale: Secondary | ICD-10-CM

## 2013-10-06 DIAGNOSIS — Z6841 Body Mass Index (BMI) 40.0 and over, adult: Secondary | ICD-10-CM | POA: Insufficient documentation

## 2013-10-06 DIAGNOSIS — I4891 Unspecified atrial fibrillation: Secondary | ICD-10-CM | POA: Insufficient documentation

## 2013-10-06 DIAGNOSIS — I369 Nonrheumatic tricuspid valve disorder, unspecified: Secondary | ICD-10-CM

## 2013-10-06 DIAGNOSIS — R0609 Other forms of dyspnea: Secondary | ICD-10-CM | POA: Insufficient documentation

## 2013-10-06 DIAGNOSIS — R0989 Other specified symptoms and signs involving the circulatory and respiratory systems: Secondary | ICD-10-CM

## 2013-10-06 DIAGNOSIS — E119 Type 2 diabetes mellitus without complications: Secondary | ICD-10-CM | POA: Insufficient documentation

## 2013-10-06 DIAGNOSIS — E669 Obesity, unspecified: Secondary | ICD-10-CM | POA: Insufficient documentation

## 2013-10-06 DIAGNOSIS — Z7901 Long term (current) use of anticoagulants: Secondary | ICD-10-CM

## 2013-10-06 DIAGNOSIS — Z86711 Personal history of pulmonary embolism: Secondary | ICD-10-CM

## 2013-10-06 DIAGNOSIS — J449 Chronic obstructive pulmonary disease, unspecified: Secondary | ICD-10-CM | POA: Insufficient documentation

## 2013-10-06 DIAGNOSIS — J4489 Other specified chronic obstructive pulmonary disease: Secondary | ICD-10-CM | POA: Insufficient documentation

## 2013-10-06 DIAGNOSIS — I10 Essential (primary) hypertension: Secondary | ICD-10-CM | POA: Insufficient documentation

## 2013-10-06 LAB — PROTIME-INR
INR: 1.7 — ABNORMAL HIGH (ref 0.8–1.2)
Prothrombin Time: 17.1 s — ABNORMAL HIGH (ref 9.1–12.0)

## 2013-10-06 MED ORDER — TECHNETIUM TC 99M SESTAMIBI GENERIC - CARDIOLITE
10.0000 | Freq: Once | INTRAVENOUS | Status: AC | PRN
  Administered 2013-10-06: 10 via INTRAVENOUS

## 2013-10-06 MED ORDER — TECHNETIUM TC 99M SESTAMIBI - CARDIOLITE: 30.0000 | Freq: Once | INTRAVENOUS | Status: DC | PRN

## 2013-10-06 MED ORDER — SODIUM CHLORIDE 0.9 % IJ SOLN
INTRAMUSCULAR | Status: AC
  Administered 2013-10-06: 10 mL via INTRAVENOUS
  Filled 2013-10-06: qty 10

## 2013-10-06 MED ORDER — REGADENOSON 0.4 MG/5ML IV SOLN
INTRAVENOUS | Status: AC
  Administered 2013-10-06: 0.4 mg via INTRAVENOUS
  Filled 2013-10-06: qty 5

## 2013-10-06 MED ORDER — TECHNETIUM TC 99M SESTAMIBI - CARDIOLITE
30.0000 | Freq: Once | INTRAVENOUS | Status: AC | PRN
  Administered 2013-10-06: 12:00:00 30 via INTRAVENOUS

## 2013-10-06 NOTE — Progress Notes (Signed)
*  PRELIMINARY RESULTS* Echocardiogram 2D Echocardiogram has been performed.  Kathleen [name] 10/06/2013, 4:10 PM 

## 2013-10-06 NOTE — Progress Notes (Signed)
Stress Lab Nurses Notes - [name]  Kathleen [name] 10/06/2013 Reason for doing test: Dyspnea and AFib Type of test: [name] Nurse performing test: Parke Poisson, RN Nuclear Medicine Tech: Lou Cal Echo Tech: Not Applicable MD performing test: Dr.Koneswaran / M.Geni Bers PA Family MD: Dr. Christell Constant Test explained and consent signed: yes IV started: 22g jelco, Saline lock flushed, No redness or edema and Saline lock started in radiology Symptoms: None Treatment/Intervention: None Reason test stopped: protocol completed After recovery IV was: Discontinued via X-ray tech and No redness or edema Patient to return to Nuc. Med at : 12:30 Patient discharged: Home Patient's Condition upon discharge was: stable Comments: During test BP 112/58 & HR 82.  Recovery BP 110/58 & HR 78.  Symptoms resolved in recovery. Erskine Speed T

## 2013-10-08 ENCOUNTER — Encounter: Payer: Self-pay | Admitting: *Deleted

## 2013-10-08 ENCOUNTER — Ambulatory Visit (INDEPENDENT_AMBULATORY_CARE_PROVIDER_SITE_OTHER): Payer: Medicare Other | Admitting: Family Medicine

## 2013-10-08 ENCOUNTER — Encounter: Payer: Self-pay | Admitting: Family Medicine

## 2013-10-08 VITALS — BP 116/66 | HR 72 | Temp 98.2°F | Ht 65.0 in | Wt 334.0 lb

## 2013-10-08 DIAGNOSIS — L259 Unspecified contact dermatitis, unspecified cause: Secondary | ICD-10-CM

## 2013-10-08 DIAGNOSIS — M793 Panniculitis, unspecified: Secondary | ICD-10-CM

## 2013-10-08 DIAGNOSIS — Z79899 Other long term (current) drug therapy: Secondary | ICD-10-CM

## 2013-10-08 DIAGNOSIS — L309 Dermatitis, unspecified: Secondary | ICD-10-CM

## 2013-10-08 LAB — POCT GLYCOSYLATED HEMOGLOBIN (HGB A1C): Hemoglobin A1C: 5.4

## 2013-10-08 LAB — POCT CBC
Granulocyte percent: 78.8 %G (ref 37–80)
Hemoglobin: 13.5 g/dL (ref 12.2–16.2)
MCH, POC: 33.4 pg — AB (ref 27–31.2)
MCHC: 33.3 g/dL (ref 31.8–35.4)
MCV: 100.3 fL — AB (ref 80–97)
MPV: 7.8 fL (ref 0–99.8)
POC LYMPH PERCENT: 18.4 %L (ref 10–50)
Platelet Count, POC: 219 10*3/uL (ref 142–424)
RBC: 4 M/uL — AB (ref 4.04–5.48)
RDW, POC: 15 %

## 2013-10-08 LAB — POCT CBG (FASTING - GLUCOSE)-MANUAL ENTRY: Glucose Fasting, POC: 119 mg/dL — AB (ref 70–99)

## 2013-10-08 LAB — POCT WET PREP WITH KOH
KOH Prep POC: POSITIVE
Trichomonas, UA: NEGATIVE

## 2013-10-08 MED ORDER — CLOTRIMAZOLE 1 % EX OINT: TOPICAL_OINTMENT | CUTANEOUS | Status: AC

## 2013-10-08 MED ORDER — FLUCONAZOLE 150 MG PO TABS: 150.0000 mg | ORAL_TABLET | Freq: Once | ORAL | Status: DC

## 2013-10-08 MED ORDER — DOXYCYCLINE HYCLATE 100 MG PO CAPS: 100.0000 mg | ORAL_CAPSULE | Freq: Two times a day (BID) | ORAL | Status: DC

## 2013-10-08 NOTE — Progress Notes (Signed)
Subjective:    Patient ID: Kathleen [name], female    DOB: Feb 21, 1946, 67 y.o.   MRN: [id number]  HPI HPI  This patient complains of a RASH  Location: L groin   Onset: 1 week   Course: Pt had vein ablation procedure in L groin 1-2 weeks ago. Had bandaging removed earlier this week. Area was red at the time. Has had progressive redness, irritation, mild clear drainage. No fever or chills. Area non tender. On prednisone chronically. Diabetic status unknown. Doxycycline use w/in last 2 months.  No vaginal discharge or dysuria.  Self-treated with: nothing   Improvement with treatment: n/a  History  Itching: no  Tenderness: no  New medications/antibiotics: no  Pet exposure: no  Recent travel or tropical exposure: no  New soaps, shampoos, detergent, clothing: no  Tick/insect exposure: no  Chemical Exposure: no  Red Flags  Feeling ill: no  Fever: no  Facial/tongue swelling/difficulty breathing: no  Diabetic or immunocompromised: morbidly obese, on prednisone chronically      Patient Active Problem List   Diagnosis Date Noted  . Varicose veins of lower extremities with other complications 09/06/2013  . Chronic anticoagulation 08/30/2013  . Nonhealing skin ulcer 08/24/2013  . Venous ulcer of leg 08/24/2013  . Pulmonary embolism 04/12/2013  . Septic shock(785.52) 11/13/2012  . AKI (acute kidney injury) 11/13/2012  . HCAP (healthcare-associated pneumonia) 11/13/2012  . Acute respiratory failure with hypercapnia 11/03/2012  . Acute encephalopathy 11/03/2012  . Acute-on-chronic respiratory failure 11/03/2012  . Morbid obesity 11/03/2012  . Hypotension 11/03/2012  . OSA (obstructive sleep apnea) 11/03/2012  . Obesity hypoventilation syndrome 11/03/2012  . Cellulitis 11/02/2012  . DOE (dyspnea on exertion) 01/22/2012  . Insomnia 01/22/2012  . Hematuria 01/22/2012  . HTN (hypertension)   . Hyperlipemia   . GERD (gastroesophageal reflux disease)   . Obesity   . Personal history  of pulmonary embolism 05/22/2011   Current Outpatient Prescriptions on File Prior to Visit  Medication Sig Dispense Refill  . amiodarone (PACERONE) 200 MG tablet TAKE 2 TABLETS ONCE A DAY  60 tablet  3  . Ascorbic Acid (VITAMIN C) 1000 MG tablet Take 1,000 mg by mouth 2 (two) times daily.      Marland Kitchen atorvastatin (LIPITOR) 20 MG tablet Take 1 tablet (20 mg total) by mouth daily.  30 tablet  3  . docusate sodium (COLACE) 100 MG capsule Take 1 capsule (100 mg total) by mouth 2 (two) times daily.  60 capsule  3  . ipratropium-albuterol (DUONEB) 0.5-2.5 (3) MG/3ML SOLN Take 3 mLs by nebulization 3 (three) times daily.  360 mL  4  . MYRBETRIQ 25 MG TB24 tablet TAKE 2 TABLETS ONCE A DAY FOR NEUROGENIC BLADDER  60 tablet  2  . polyethylene glycol powder (GLYCOLAX/MIRALAX) powder Take 17 g by mouth 2 (two) times daily.  1054 g  4  . potassium chloride SA (K-DUR,KLOR-CON) 20 MEQ tablet DISSOLVE & TAKE 1 TABLET IN WATER 2 TIMES A DAY FOR POTASSIUM SUPPLEMENT  60 tablet  3  . predniSONE (DELTASONE) 5 MG tablet TAKE 1 TABLET ONCE A DAY  30 tablet  3  . torsemide (DEMADEX) 20 MG tablet Take 20 mg by mouth daily.      Marland Kitchen warfarin (COUMADIN) 3 MG tablet TAKE 1 OR 2 TABLETS ONCE DAILY AS DIRECTED  60 tablet  2  . Amino Acids-Protein Hydrolys (PRO-STAT AWC) LIQD TAKE 30 ML TWICE DAILY  887 mL  2   No current facility-administered medications  on file prior to visit.      Review of Systems  All other systems reviewed and are negative.       Objective:   Physical Exam  Constitutional: She is oriented to person, place, and time.  Morbidly obese    HENT:  Head: Normocephalic and atraumatic.  Eyes: Conjunctivae are normal. Pupils are equal, round, and reactive to light.  Neck: Normal range of motion. Neck supple.  Cardiovascular: Normal rate and regular rhythm.   Pulmonary/Chest: Effort normal and breath sounds normal.  Abdominal:  Obese abdomen  + pannus    Musculoskeletal: Normal range of motion.    Neurological: She is alert and oriented to person, place, and time.  Skin: Rash noted.  Foul smelling odor at site of rash.     Filed Vitals:   10/08/13 1005  BP: 116/66  Pulse: 72  Temp: 98.2 F (36.8 C)   Lab Results  Component Value Date   HGBA1C 5.4 10/08/2013   Lab Results  Component Value Date   WBC 9.4 10/08/2013   HGB 13.5 10/08/2013   HCT 40.5 10/08/2013   MCV 100.3* 10/08/2013   PLT 111* 12/07/2012             Assessment & Plan:  Panniculitis - Plan: POCT CBC, POCT glycosylated hemoglobin (Hb A1C), POCT CBG (Fasting - Glucose), Aerobic culture, POCT Wet Prep with KOH  Dermatitis - Plan: POCT CBC, POCT glycosylated hemoglobin (Hb A1C), POCT CBG (Fasting - Glucose)  Morbid obesity - Plan: POCT glycosylated hemoglobin (Hb A1C), POCT CBG (Fasting - Glucose)  Encounter for long-term (current) use of medications - Plan: POCT CBC, POCT glycosylated hemoglobin (Hb A1C), POCT CBG (Fasting - Glucose)  Orders Placed This Encounter  Procedures  . Aerobic culture  . POCT CBC  . POCT glycosylated hemoglobin (Hb A1C)  . POCT CBG (Fasting - Glucose)  . POCT Wet Prep with KOH   DDx includes fungal +/- bacterial panniculitis, contact dermatitis.  Distribution of rash is not classic for cellulitis, but this is still on the differential.  Post procedure incision site looks stable.  Higher risk for fungal infection given chronic prednisone use, morbid obesity and recent abx use w/in last 2 months.  A1C today 5.4- reassuring.  WBC 9 today .  Pt non toxic appearing currently. Will place on oral and tpical antifungal as well as doxy for soft tissue coverage.  Hold coumadin. Recheck on Monday.  Wound culture and KOH obtained today. + for yeast.  Discussed infectious red flags at length with pt and caregiver.  Follow up on Monday.  Will need to follow up with wound care for this issue in the future if it recurs.

## 2013-10-11 ENCOUNTER — Ambulatory Visit: Payer: Medicare Other | Admitting: Family Medicine

## 2013-10-12 ENCOUNTER — Ambulatory Visit: Payer: Medicare Other | Admitting: Family Medicine

## 2013-10-12 ENCOUNTER — Other Ambulatory Visit: Payer: Medicare Other

## 2013-10-12 ENCOUNTER — Encounter: Payer: Self-pay | Admitting: General Practice

## 2013-10-12 ENCOUNTER — Ambulatory Visit (INDEPENDENT_AMBULATORY_CARE_PROVIDER_SITE_OTHER): Payer: Medicare Other | Admitting: General Practice

## 2013-10-12 VITALS — BP 122/72 | HR 79 | Temp 97.9°F | Ht 65.0 in | Wt 332.5 lb

## 2013-10-12 DIAGNOSIS — M793 Panniculitis, unspecified: Secondary | ICD-10-CM

## 2013-10-12 DIAGNOSIS — I2699 Other pulmonary embolism without acute cor pulmonale: Secondary | ICD-10-CM

## 2013-10-12 DIAGNOSIS — L259 Unspecified contact dermatitis, unspecified cause: Secondary | ICD-10-CM

## 2013-10-12 DIAGNOSIS — L309 Dermatitis, unspecified: Secondary | ICD-10-CM

## 2013-10-12 NOTE — Progress Notes (Signed)
Patient came in for labs only.

## 2013-10-12 NOTE — Addendum Note (Signed)
Addended by: Orma Render F on: 10/12/2013 10:41 AM   Modules accepted: Orders

## 2013-10-13 ENCOUNTER — Ambulatory Visit: Payer: Medicare Other | Admitting: Pharmacist

## 2013-10-13 ENCOUNTER — Telehealth: Payer: Self-pay | Admitting: Family Medicine

## 2013-10-13 LAB — AEROBIC CULTURE

## 2013-10-13 NOTE — Progress Notes (Signed)
   Subjective:    Patient ID: Kathleen [name], female    DOB: Jun 30, 1946, 67 y.o.   MRN: [id number]  HPI Patient presents today for recheck of panniculitis and dermatitis. She reports taking oral antibiotics and applying ointment as prescribed. Kathleen [name] reports left groin and area underneath abdomen have improved. Reports less skin irritation, redness, and no odor. She does verbalize having difficulty keep those areas clean and dry, due to difficulty accessing. Reports now that she has home health, caregivers will be able to assist.     Review of Systems  Respiratory: Negative for chest tightness and shortness of breath.   Cardiovascular: Negative for chest pain and palpitations.  Skin:       Healing skin irritation in groin area and underneath abdomen       Objective:   Physical Exam  Constitutional: She is oriented to person, place, and time. She appears well-developed and well-nourished.  Morbidly obese   Cardiovascular: Normal rate, regular rhythm and normal heart sounds.   Pulmonary/Chest: Effort normal and breath sounds normal. No respiratory distress. She exhibits no tenderness.  Neurological: She is alert and oriented to person, place, and time.  Skin: Skin is warm and dry.  Left groin, negative for erythema, irritation improving. Negative for drainage or odor.  Left inner thigh healing superficial laceration, 1 inch x 1/8 inch, edges pink, negative edema or erythema. Pannicular fold, slight erythema, negative drainage or odor.  Psychiatric: She has a normal mood and affect.          Assessment & Plan:  1. Panniculitis and 2. Dermatitis -symptoms resolving -Continue current medications and care instructions -RTO in 1 week for recheck and prn -INR pending (will notify with results and instructions) -Patient verbalized understanding Coralie Keens, FNP-C

## 2013-10-13 NOTE — Telephone Encounter (Signed)
Have pt resume coumadin at normal dosing.  Will need repeat INR on 12/26

## 2013-10-13 NOTE — Patient Instructions (Signed)
Personal Hygiene Personal hygiene means keeping your body clean. Keeping your skin, hands, teeth, hair, nails, and feet clean every day are the best ways to keep infections away. Follow the guidelines below for ways to stay healthy and avoid spreading illness to others. HAND WASHING Your hands touch a lot of surfaces throughout the day. This can put germs on your hands. Washing them often and properly is a good way to prevent germs from spreading to others and making you ill.  When to wash your hands  Before and after preparing food.  Before and after caring for a wound on the skin.  Before eating.  Before touching your face.  After using the restroom.  After blowing your nose or coughing.  After touching animals.  After touching trash or something dirty.  After changing diapers.  After caring for others who are ill.  After gardening. How to wash your hands  Wet your hands with warm or cold running water.  Scrub and rub your hands together with soap for 20 to 30 seconds. (It may help to hum a short tune each time that is about this long.)  Make sure you wash all areas including between fingers and under nails.  Rinse well.  Dry your hands with a clean towel. Use disposable towels or dryers in restrooms.  If running water is not available, use an alcohol-based hand sanitizer. Rub it all over the surfaces of your hands. It can safely remove many, but not all, of the germs on your hands. Teaching children good hand washing practices early can benefit them throughout life and keep your family healthy. FINGERNAIL CARE Germs under the nails can cause infection or fungus outbreaks. Nails that are too long can scratch and irritate the skin.  Wash hands frequently and look under your nails. You may use a scrub brush or clipper nail tool to loosen the dirt and germs under the nails.  Keep the nails trimmed to shorter lengths to avoid scratching the skin. You may use nail  clippers or scissors. BATHING DAILY It is important to wash away sweat and oils on the skin and keep any open wounds on the skin clean. Daily bathing can help prevent bacteria from causing infection in the skin or other problems such as body lice.Body lice are tiny parasites that can cause itching and a rash. You can get body lice when you come in contact with someone who has lice, or with infected clothing, towels, or bedding. You are more likely to get lice if you do not bathe regularly.  Take a daily shower or bath. Clean all areas of the body and skin folds with a soapy washcloth. Work from the head and face area to the arms, abdomen, back, legs, genitals, and anus last. When cleaning the genital areas, males and females should wash from front (tip of the penis or front of the labia) to back (rectal area). Rinse well. Dry with a clean towel.  Try washing your face twice daily with a mild soap or cleanser. Always use a clean towel. This can reduce oils, bacteria, and acne on the skin.  If you have a skin condition, use only the products recommended by your caregiver.  Never share personal hygiene items such as towels, razors, or deodorant. Disposable razors should be discarded after a few uses.  Public facilities (showers, lockers, pools) can contain many germs. Make sure you use only your personal items in public places. FOOT CARE Keeping your feet clean and  toenails trimmed can help you avoid infection, irritation, or fungal outbreaks. Athlete's foot is a common foot condition caused by fungal growth on the skin. This happens when the feet stay moist and sweaty inside a shoe for too long, or when the feet are not protected from germs in public places.  Wash your feet daily with soap and water, especially after activity and sweating. Dry your feet including in between the toes. Put on fresh socks.  Keep your toenails trimmed straight across.Do not trim them too short as this can lead to  ingrown toenails. Ingrown toenails can be painful and lead to infection.  Keep footwear clean and fresh. Wash sneakers and clean the inside of your shoes regularly.  Foot powders can help to reduce moisture in shoes, reducing fungus and germs.  Always wear shoes or flip flops in public pools, showers, lockers, or training facilities. BRUSHING TEETH Bacteria on the gums, tongue, and teeth can lead to infection. Brushing, flossing, and rinsing can help to wash away bacteria in the mouth.  Brush your teeth at least 2 times per day for several minutes. Floss your teeth at least once per day.  Brush your tongue daily.  Use an oral rinse as recommended by your dental caregiver. HAIR CARE  Shampoo your hair regularly.  Use your own comb or brush. Brush daily from the scalp to the ends of the hair.  Clean and disinfect your hair tools regularly.Pull the loose hair out from combs and brushes.  Head lice are tiny insects that spread easily and feed on the scalp causing itching and irritation. Head lice spread easily, avoid:  Head to head contact.  Sharing hair tools.  Barrettes.  Headbands.  Headphones.  Hats and scarves. OTHER TIPS TO HELP PREVENT INFECTION  Make sure all of your immunizations are up to date.  Disinfect surfaces at home and work.  Clean bath toys periodically in the dishwasher or with a bleach and water solution (9 parts water to 1 part bleach).  Avoid mosquito and tick bites by wearing proper clothing and using insect spray.  Eat a healthy, well-balanced diet with plenty of grains, fruits, and vegetables.  Exercise regularly. FOR MORE INFORMATION Centers for Disease Control and Prevention [employer] Document Released: 11/09/2010 Document Revised: 02/01/2013 Document Reviewed: 11/09/2010 [facility] Patient Information 2014 [place], [place].

## 2013-10-18 NOTE — Telephone Encounter (Signed)
Patient had visit with clinical pharmacist for protime and med adjustment on 10/13/13.

## 2013-10-20 ENCOUNTER — Ambulatory Visit (INDEPENDENT_AMBULATORY_CARE_PROVIDER_SITE_OTHER): Payer: Medicare Other | Admitting: General Practice

## 2013-10-20 ENCOUNTER — Ambulatory Visit: Payer: Medicare Other | Admitting: General Practice

## 2013-10-20 ENCOUNTER — Encounter: Payer: Self-pay | Admitting: General Practice

## 2013-10-20 ENCOUNTER — Ambulatory Visit: Payer: Medicare Other | Admitting: Family Medicine

## 2013-10-20 VITALS — BP 119/73 | HR 83 | Temp 97.6°F | Ht 65.0 in | Wt 335.5 lb

## 2013-10-20 DIAGNOSIS — Z8739 Personal history of other diseases of the musculoskeletal system and connective tissue: Secondary | ICD-10-CM

## 2013-10-20 DIAGNOSIS — Z872 Personal history of diseases of the skin and subcutaneous tissue: Secondary | ICD-10-CM

## 2013-10-20 DIAGNOSIS — I2699 Other pulmonary embolism without acute cor pulmonale: Secondary | ICD-10-CM

## 2013-10-20 NOTE — Patient Instructions (Signed)
Personal Hygiene Personal hygiene means keeping your body clean. Keeping your skin, hands, teeth, hair, nails, and feet clean every day are the best ways to keep infections away. Follow the guidelines below for ways to stay healthy and avoid spreading illness to others. HAND WASHING Your hands touch a lot of surfaces throughout the day. This can put germs on your hands. Washing them often and properly is a good way to prevent germs from spreading to others and making you ill.  When to wash your hands  Before and after preparing food.  Before and after caring for a wound on the skin.  Before eating.  Before touching your face.  After using the restroom.  After blowing your nose or coughing.  After touching animals.  After touching trash or something dirty.  After changing diapers.  After caring for others who are ill.  After gardening. How to wash your hands  Wet your hands with warm or cold running water.  Scrub and rub your hands together with soap for 20 to 30 seconds. (It may help to hum a short tune each time that is about this long.)  Make sure you wash all areas including between fingers and under nails.  Rinse well.  Dry your hands with a clean towel. Use disposable towels or dryers in restrooms.  If running water is not available, use an alcohol-based hand sanitizer. Rub it all over the surfaces of your hands. It can safely remove many, but not all, of the germs on your hands. Teaching children good hand washing practices early can benefit them throughout life and keep your family healthy. FINGERNAIL CARE Germs under the nails can cause infection or fungus outbreaks. Nails that are too long can scratch and irritate the skin.  Wash hands frequently and look under your nails. You may use a scrub brush or clipper nail tool to loosen the dirt and germs under the nails.  Keep the nails trimmed to shorter lengths to avoid scratching the skin. You may use nail  clippers or scissors. BATHING DAILY It is important to wash away sweat and oils on the skin and keep any open wounds on the skin clean. Daily bathing can help prevent bacteria from causing infection in the skin or other problems such as body lice.Body lice are tiny parasites that can cause itching and a rash. You can get body lice when you come in contact with someone who has lice, or with infected clothing, towels, or bedding. You are more likely to get lice if you do not bathe regularly.  Take a daily shower or bath. Clean all areas of the body and skin folds with a soapy washcloth. Work from the head and face area to the arms, abdomen, back, legs, genitals, and anus last. When cleaning the genital areas, males and females should wash from front (tip of the penis or front of the labia) to back (rectal area). Rinse well. Dry with a clean towel.  Try washing your face twice daily with a mild soap or cleanser. Always use a clean towel. This can reduce oils, bacteria, and acne on the skin.  If you have a skin condition, use only the products recommended by your caregiver.  Never share personal hygiene items such as towels, razors, or deodorant. Disposable razors should be discarded after a few uses.  Public facilities (showers, lockers, pools) can contain many germs. Make sure you use only your personal items in public places. FOOT CARE Keeping your feet clean and   toenails trimmed can help you avoid infection, irritation, or fungal outbreaks. Athlete's foot is a common foot condition caused by fungal growth on the skin. This happens when the feet stay moist and sweaty inside a shoe for too long, or when the feet are not protected from germs in public places.  Wash your feet daily with soap and water, especially after activity and sweating. Dry your feet including in between the toes. Put on fresh socks.  Keep your toenails trimmed straight across.Do not trim them too short as this can lead to  ingrown toenails. Ingrown toenails can be painful and lead to infection.  Keep footwear clean and fresh. Wash sneakers and clean the inside of your shoes regularly.  Foot powders can help to reduce moisture in shoes, reducing fungus and germs.  Always wear shoes or flip flops in public pools, showers, lockers, or training facilities. BRUSHING TEETH Bacteria on the gums, tongue, and teeth can lead to infection. Brushing, flossing, and rinsing can help to wash away bacteria in the mouth.  Brush your teeth at least 2 times per day for several minutes. Floss your teeth at least once per day.  Brush your tongue daily.  Use an oral rinse as recommended by your dental caregiver. HAIR CARE  Shampoo your hair regularly.  Use your own comb or brush. Brush daily from the scalp to the ends of the hair.  Clean and disinfect your hair tools regularly.Pull the loose hair out from combs and brushes.  Head lice are tiny insects that spread easily and feed on the scalp causing itching and irritation. Head lice spread easily, avoid:  Head to head contact.  Sharing hair tools.  Barrettes.  Headbands.  Headphones.  Hats and scarves. OTHER TIPS TO HELP PREVENT INFECTION  Make sure all of your immunizations are up to date.  Disinfect surfaces at home and work.  Clean bath toys periodically in the dishwasher or with a bleach and water solution (9 parts water to 1 part bleach).  Avoid mosquito and tick bites by wearing proper clothing and using insect spray.  Eat a healthy, well-balanced diet with plenty of grains, fruits, and vegetables.  Exercise regularly. FOR MORE INFORMATION Centers for Disease Control and Prevention http://www.cdc.gov/http://www.cdc.gov/ Document Released: 11/09/2010 Document Revised: 02/01/2013 Document Reviewed: 11/09/2010 [employer] Patient Information 2014 [employer].  

## 2013-10-20 NOTE — Progress Notes (Signed)
   Subjective:    Patient ID: Kathleen [name], female    DOB: 27-May-1946, 67 y.o.   MRN: [id number]  HPI Patient presents today for follow up of panniculitis and cellulitis. Reports completing full dose of antibiotics and continues to use lotrimin cream. Reports the area is feeling better every day.     Review of Systems  Respiratory: Negative for chest tightness and shortness of breath.   Cardiovascular: Negative for chest pain and palpitations.       Objective:   Physical Exam  Constitutional: She is oriented to person, place, and time. She appears well-developed and well-nourished.  Morbidly obese   Cardiovascular: Normal rate, regular rhythm and normal heart sounds.   Pulmonary/Chest: Effort normal and breath sounds normal. No respiratory distress. She exhibits no tenderness.  Neurological: She is alert and oriented to person, place, and time.  Skin: Skin is warm and dry.  Left groin, negative for erythema or irritation Left inner thigh slow healing superficial laceration, 1 inch x 1/8 inch, edges pink, negative edema or erythema. Pannicular fold has slight erythema and slight odor noted.   Psychiatric: She has a normal mood and affect.          Assessment & Plan:  1. Pulmonary embolus  - Protime-INR  2. History of dermatitis and 3. History of panniculitis -Continue Lotrimin cream -practice proper hygiene, especially in affected area -RTO in 1 week for follow up -Patient verbalized understanding Coralie Keens, FNP-C

## 2013-10-21 LAB — PROTIME-INR
INR: 1.8 — ABNORMAL HIGH (ref 0.8–1.2)
Prothrombin Time: 18.2 s — ABNORMAL HIGH (ref 9.1–12.0)

## 2013-10-22 ENCOUNTER — Telehealth: Payer: Self-pay | Admitting: Pharmacist

## 2013-10-22 NOTE — Telephone Encounter (Signed)
INR was 1.8 on 10/20/13.  She is instructed to take 2 tablets today and then 1 tablet daily until recheck INR on Wedneday 10/27/13. Patient notified.

## 2013-10-26 ENCOUNTER — Other Ambulatory Visit: Payer: Self-pay | Admitting: General Practice

## 2013-10-27 ENCOUNTER — Other Ambulatory Visit (INDEPENDENT_AMBULATORY_CARE_PROVIDER_SITE_OTHER): Payer: Medicare Other

## 2013-10-27 DIAGNOSIS — I2699 Other pulmonary embolism without acute cor pulmonale: Secondary | ICD-10-CM

## 2013-10-27 NOTE — Progress Notes (Signed)
Patient came in for labs only.

## 2013-10-28 ENCOUNTER — Ambulatory Visit (INDEPENDENT_AMBULATORY_CARE_PROVIDER_SITE_OTHER): Payer: Medicare Other | Admitting: Pharmacist

## 2013-10-28 LAB — PROTIME-INR
INR: 2.2 — ABNORMAL HIGH (ref 0.8–1.2)
Prothrombin Time: 22.4 s — ABNORMAL HIGH (ref 9.1–12.0)

## 2013-10-28 NOTE — Progress Notes (Signed)
Patient aware of results and directions.  Will come in to recheck in 1 week.

## 2013-11-03 ENCOUNTER — Telehealth: Payer: Self-pay | Admitting: General Practice

## 2013-11-03 ENCOUNTER — Other Ambulatory Visit: Payer: Medicare Other

## 2013-11-03 DIAGNOSIS — I4891 Unspecified atrial fibrillation: Secondary | ICD-10-CM

## 2013-11-04 ENCOUNTER — Ambulatory Visit (INDEPENDENT_AMBULATORY_CARE_PROVIDER_SITE_OTHER): Payer: Medicare Other | Admitting: Pharmacist

## 2013-11-04 LAB — PROTIME-INR
INR: 2.3 — ABNORMAL HIGH (ref 0.8–1.2)
Prothrombin Time: 23.5 s — ABNORMAL HIGH (ref 9.1–12.0)

## 2013-11-04 NOTE — Telephone Encounter (Signed)
INR reviewed and patient notified - see anticoag note.

## 2013-11-04 NOTE — Progress Notes (Signed)
Patient's INR is sent out to [facility]. INR was 2.3 - therapeutic.  She is instructed to continue 3mg  1 tablet daily and recheck INR in 2 weeks.  Henrene Pastorammy Naidelyn Parrella, PharmD, CPP

## 2013-11-08 ENCOUNTER — Encounter: Payer: Self-pay | Admitting: General Practice

## 2013-11-08 ENCOUNTER — Ambulatory Visit (INDEPENDENT_AMBULATORY_CARE_PROVIDER_SITE_OTHER): Payer: Medicare Other | Admitting: General Practice

## 2013-11-08 VITALS — BP 116/66 | HR 70 | Temp 97.3°F | Ht 65.0 in | Wt 331.5 lb

## 2013-11-08 DIAGNOSIS — K219 Gastro-esophageal reflux disease without esophagitis: Secondary | ICD-10-CM

## 2013-11-08 DIAGNOSIS — E785 Hyperlipidemia, unspecified: Secondary | ICD-10-CM

## 2013-11-08 DIAGNOSIS — R7989 Other specified abnormal findings of blood chemistry: Secondary | ICD-10-CM

## 2013-11-08 DIAGNOSIS — I10 Essential (primary) hypertension: Secondary | ICD-10-CM

## 2013-11-08 DIAGNOSIS — Z7901 Long term (current) use of anticoagulants: Secondary | ICD-10-CM

## 2013-11-08 DIAGNOSIS — G4733 Obstructive sleep apnea (adult) (pediatric): Secondary | ICD-10-CM

## 2013-11-08 DIAGNOSIS — I4891 Unspecified atrial fibrillation: Secondary | ICD-10-CM

## 2013-11-08 DIAGNOSIS — R799 Abnormal finding of blood chemistry, unspecified: Secondary | ICD-10-CM

## 2013-11-08 NOTE — Patient Instructions (Signed)

## 2013-11-08 NOTE — Progress Notes (Signed)
   Subjective:    Patient ID: Kathleen [name], female    DOB: 15-Nov-1945, 68 y.o.   MRN: [id number]  HPI Patient presents today for chronic health follow up. She has a history of afib, HLD, Hx. PE, asthma, and osa. She reports taking medications as prescribed. Reports taking medications as prescribed. Denies regular exercise, but trying to eat healthier.     Review of Systems  Constitutional: Negative for fever and chills.  Respiratory: Negative for chest tightness, shortness of breath and wheezing.   Cardiovascular: Negative for chest pain, palpitations and leg swelling.  Gastrointestinal: Negative for nausea, vomiting, diarrhea, constipation, blood in stool and abdominal distention.  Genitourinary: Negative for dysuria, hematuria and difficulty urinating.  Skin: Negative.   Neurological: Negative for dizziness, weakness and headaches.  All other systems reviewed and are negative.       Objective:   Physical Exam  Constitutional: She is oriented to person, place, and time. She appears well-developed and well-nourished.  Obese    HENT:  Head: Normocephalic and atraumatic.  Right Ear: External ear normal.  Left Ear: External ear normal.  Mouth/Throat: Oropharynx is clear and moist.  Eyes: Pupils are equal, round, and reactive to light.  Neck: Normal range of motion. Neck supple. No thyromegaly present.  Cardiovascular: Normal rate, regular rhythm and normal heart sounds.   Pulmonary/Chest: Effort normal and breath sounds normal. No respiratory distress. She exhibits no tenderness.  Abdominal: Soft. Bowel sounds are normal. She exhibits no distension. There is no tenderness.  Lymphadenopathy:    She has no cervical adenopathy.  Neurological: She is alert and oriented to person, place, and time.  Skin: Skin is warm and dry.  Psychiatric: She has a normal mood and affect.          Assessment & Plan:  1. Hyperlipidemia  - Lipid panel  2. Elevated serum creatinine  -  CMP14+EGFR  3. HTN (hypertension)   4. A-fib   5. GERD (gastroesophageal reflux disease)   6. OSA (obstructive sleep apnea)   7. Warfarin anticoagulation -Continue all current medications Labs pending F/u in 3 months Discussed benefits of regular exercise and healthy eating Patient verbalized understanding Erby Pian, FNP-C

## 2013-11-09 ENCOUNTER — Other Ambulatory Visit: Payer: Self-pay | Admitting: General Practice

## 2013-11-09 LAB — CMP14+EGFR
A/G RATIO: 0.9 — AB (ref 1.1–2.5)
ALBUMIN: 3.7 g/dL (ref 3.6–4.8)
ALK PHOS: 69 IU/L (ref 39–117)
ALT: 50 IU/L — ABNORMAL HIGH (ref 0–32)
AST: 38 IU/L (ref 0–40)
BILIRUBIN TOTAL: 0.2 mg/dL (ref 0.0–1.2)
BUN / CREAT RATIO: 27 — AB (ref 11–26)
BUN: 27 mg/dL (ref 8–27)
CO2: 24 mmol/L (ref 18–29)
CREATININE: 1.01 mg/dL — AB (ref 0.57–1.00)
Calcium: 8.9 mg/dL (ref 8.7–10.3)
Chloride: 96 mmol/L — ABNORMAL LOW (ref 97–108)
GFR calc Af Amer: 66 mL/min/{1.73_m2} (ref >59)
GFR, EST NON AFRICAN AMERICAN: 57 mL/min/{1.73_m2} — AB (ref >59)
GLOBULIN, TOTAL: 3.9 g/dL (ref 1.5–4.5)
Glucose: 98 mg/dL (ref 65–99)
Potassium: 4.3 mmol/L (ref 3.5–5.2)
Sodium: 138 mmol/L (ref 134–144)
Total Protein: 7.6 g/dL (ref 6.0–8.5)

## 2013-11-09 LAB — LIPID PANEL
CHOL/HDL RATIO: 6.5 ratio — AB (ref 0.0–4.4)
Cholesterol, Total: 288 mg/dL — ABNORMAL HIGH (ref 100–199)
HDL: 44 mg/dL (ref >39)
LDL CALC: 177 mg/dL — AB (ref 0–99)
Triglycerides: 335 mg/dL — ABNORMAL HIGH (ref 0–149)
VLDL Cholesterol Cal: 67 mg/dL — ABNORMAL HIGH (ref 5–40)

## 2013-11-12 ENCOUNTER — Ambulatory Visit (INDEPENDENT_AMBULATORY_CARE_PROVIDER_SITE_OTHER): Payer: Medicare Other | Admitting: Cardiovascular Disease

## 2013-11-12 ENCOUNTER — Encounter: Payer: Self-pay | Admitting: Cardiovascular Disease

## 2013-11-12 VITALS — BP 148/76 | HR 79 | Ht 64.0 in | Wt 330.0 lb

## 2013-11-12 DIAGNOSIS — R0989 Other specified symptoms and signs involving the circulatory and respiratory systems: Secondary | ICD-10-CM

## 2013-11-12 DIAGNOSIS — Z136 Encounter for screening for cardiovascular disorders: Secondary | ICD-10-CM

## 2013-11-12 DIAGNOSIS — R0609 Other forms of dyspnea: Secondary | ICD-10-CM

## 2013-11-12 DIAGNOSIS — Z86711 Personal history of pulmonary embolism: Secondary | ICD-10-CM

## 2013-11-12 DIAGNOSIS — I4891 Unspecified atrial fibrillation: Secondary | ICD-10-CM

## 2013-11-12 DIAGNOSIS — IMO0001 Reserved for inherently not codable concepts without codable children: Secondary | ICD-10-CM

## 2013-11-12 DIAGNOSIS — I10 Essential (primary) hypertension: Secondary | ICD-10-CM

## 2013-11-12 NOTE — Progress Notes (Signed)
Patient ID: Kathleen [name], female   DOB: 1946/08/28, 68 y.o.   MRN: [id number]      SUBJECTIVE: The patient is a 68 year old woman who is here for f/u on the results of CV testing. In summary, she has a past medical history significant for atrial fibrillation, pulmonary embolism, COPD, hypertension, morbid obesity, venous insufficiency, hyperlipidemia, sleep apnea, and diabetes. She has been experiencing dyspnea with exertion and an ECG showed a right bundle branch block with a right axis. Her Lexiscan nuclear stress test was normal. Her echocardiogram revealed normal left ventricular systolic function with an ejection fraction of 60-65% and normal diastolic function.  She denies any chest pain over the past year. She has obesity hypoventilation syndrome and uses oxygen chronically. She also has sleep apnea and uses CPAP at night. She had venous ablation of her lower extremity and is scheduled to undergo the same for her left leg in the near future.  She sleeps in a chair due to long-standing orthopnea.  She has had no further episodes of dyspnea, other than her baseline dyspnea with exertion which is multifactorial in etiology.    Allergies  Allergen Reactions  . Lovenox [Enoxaparin Sodium]     Itching, reddness    Current Outpatient Prescriptions  Medication Sig Dispense Refill  . Amino Acids-Protein Hydrolys (PRO-STAT AWC) LIQD TAKE 30 ML TWICE DAILY  887 mL  1  . amiodarone (PACERONE) 200 MG tablet TAKE 2 TABLETS ONCE A DAY  60 tablet  3  . Ascorbic Acid (VITAMIN C) 1000 MG tablet Take 1,000 mg by mouth 2 (two) times daily.      Marland Kitchen. atorvastatin (LIPITOR) 20 MG tablet Take 1 tablet (20 mg total) by mouth daily.  30 tablet  3  . clotrimazole (LOTRIMIN) 1 % cream Apply 1 application topically 2 (two) times daily.      Marland Kitchen. docusate sodium (COLACE) 100 MG capsule Take 1 capsule (100 mg total) by mouth 2 (two) times daily.  60 capsule  3  . doxycycline (VIBRAMYCIN) 100 MG capsule Take 1  capsule (100 mg total) by mouth 2 (two) times daily.  20 capsule  0  . ipratropium-albuterol (DUONEB) 0.5-2.5 (3) MG/3ML SOLN Take 3 mLs by nebulization 3 (three) times daily.  360 mL  4  . MYRBETRIQ 25 MG TB24 tablet TAKE 2 TABLETS ONCE A DAY FOR NEUROGENIC BLADDER  60 tablet  2  . polyethylene glycol powder (GLYCOLAX/MIRALAX) powder Take 17 g by mouth 2 (two) times daily.  1054 g  4  . potassium chloride SA (K-DUR,KLOR-CON) 20 MEQ tablet DISSOLVE & TAKE 1 TABLET IN WATER 2 TIMES A DAY FOR POTASSIUM SUPPLEMENT  60 tablet  3  . predniSONE (DELTASONE) 5 MG tablet TAKE 1 TABLET ONCE A DAY  30 tablet  3  . torsemide (DEMADEX) 20 MG tablet Take 20 mg by mouth daily.      Marland Kitchen. warfarin (COUMADIN) 3 MG tablet TAKE 1 OR 2 TABLETS ONCE DAILY AS DIRECTED  60 tablet  2   No current facility-administered medications for this visit.    Past Medical History  Diagnosis Date  . HTN (hypertension)   . Hyperlipemia   . GERD (gastroesophageal reflux disease)   . Obesity   . Sleep apnea     she has not been on CPAP  . PE (pulmonary embolism) 05/2011  . DOE (dyspnea on exertion) 01/22/2012  . Hematuria 01/22/2012  . Diabetes mellitus without complication   . A-fib   . Neurogenic  bladder   . CHF (congestive heart failure)   . Ulcer     both feet    Past Surgical History  Procedure Laterality Date  . Total abdominal hysterectomy w/ bilateral salpingoophorectomy    . Tonsillectomy    . Arm surgery Right     Broken and had rods inserted which later were removed.    History   Social History  . Marital Status: Widowed    Spouse Name: N/A    Number of Children: 0  . Years of Education: N/A   Occupational History  .      retired Education officer, environmental   Social History Main Topics  . Smoking status: Never Smoker   . Smokeless tobacco: Never Used  . Alcohol Use: No  . Drug Use: No  . Sexual Activity: No   Other Topics Concern  . Not on file   Social History Narrative  . No narrative on file      Filed Vitals:   11/12/13 0958  Height: 5\' 4"  (1.626 m)   BP 148/76 Pulse 79   PHYSICAL EXAM General: NAD, morbidly obese  Neck: No JVD, no thyromegaly or thyroid nodule.  Lungs: Clear to auscultation bilaterally with normal respiratory effort.  CV: Nondisplaced PMI. Heart regular S1/S2, no S3/S4, I/VI pansystolic murmur along left sternal border. 1+ leg edema bilaterally but wearing wraps. No carotid bruit.  Abdomen: Soft, obese, nontender.  Skin: Intact without lesions or rashes.  Neurologic: Alert and oriented x 3.  Psych: Normal affect.  Extremities: No clubbing or cyanosis.  HEENT: Normal.    ECG: reviewed and available in electronic records.  Lexiscan: The raw images showed appropriate radiotracer uptake in the myocardium. There was a small defect in the inferior wall in the resting images that was less prominent in the post injection images. Wall motion of the inferior wall was normal, this is consistent with sub- diaphragmatic attenuation. There were no other myocardial perfusion defects. Gated imaging showed an end-diastolic volume of 93 mL, end systolic volume of 23 mL, ejection fraction 75%, t.i.d. 0.83, and normal wall motion.  IMPRESSION: Negative Lexiscan MPI for ischemia  Normal LV systolic function and wall motion     ASSESSMENT AND PLAN: 1. Dyspnea on exertion: While she has several risk factors for coronary artery disease, her Lexiscan Cardiolite nuclear stress test was normal, as was her echocardiogram. No further testing is indicated. Her current baseline symptoms are multifactorial in etiology, and I suspect a majority stems from cardiovascular deconditioning and obesity-hypoventilation syndrome. 2. Atrial fibrillation: currently in a regular rhythm and on amiodarone and warfarin.  3. H/o pulmonary embolism: no evidence of PE on recent CT angiogram.  4. HTN: slightly elevated today. Will monitor as it was normal at last visit. She says systolic  readings are typically between 116-120 mmHg.  Dispo: f/u 6 months.  Prentice Docker, M.D., F.A.C.C.

## 2013-11-12 NOTE — Patient Instructions (Signed)
Continue all current medications. Your physician wants you to follow up in: 6 months.  You will receive a reminder letter in the mail one-two months in advance.  If you don't receive a letter, please call our office to schedule the follow up appointment   

## 2013-11-17 ENCOUNTER — Other Ambulatory Visit: Payer: Self-pay | Admitting: General Practice

## 2013-11-17 ENCOUNTER — Other Ambulatory Visit (INDEPENDENT_AMBULATORY_CARE_PROVIDER_SITE_OTHER): Payer: Medicare Other

## 2013-11-17 DIAGNOSIS — I2699 Other pulmonary embolism without acute cor pulmonale: Secondary | ICD-10-CM

## 2013-11-17 DIAGNOSIS — Z0289 Encounter for other administrative examinations: Secondary | ICD-10-CM

## 2013-11-17 DIAGNOSIS — E785 Hyperlipidemia, unspecified: Secondary | ICD-10-CM

## 2013-11-17 NOTE — Progress Notes (Signed)
Patient came in for labs only.

## 2013-11-18 ENCOUNTER — Ambulatory Visit (INDEPENDENT_AMBULATORY_CARE_PROVIDER_SITE_OTHER): Payer: Medicare Other | Admitting: Pharmacist

## 2013-11-18 LAB — PROTIME-INR
INR: 1.9 — AB (ref 0.8–1.2)
PROTHROMBIN TIME: 19.1 s — AB (ref 9.1–12.0)

## 2013-11-18 NOTE — Progress Notes (Signed)
[facility] and patient called with results.

## 2013-11-22 ENCOUNTER — Telehealth: Payer: Self-pay | Admitting: General Practice

## 2013-11-22 ENCOUNTER — Other Ambulatory Visit: Payer: Self-pay | Admitting: *Deleted

## 2013-11-24 NOTE — Telephone Encounter (Signed)
Done, pt aware.

## 2013-11-30 ENCOUNTER — Encounter: Payer: Self-pay | Admitting: General Practice

## 2013-11-30 ENCOUNTER — Ambulatory Visit (INDEPENDENT_AMBULATORY_CARE_PROVIDER_SITE_OTHER): Payer: Medicare Other | Admitting: General Practice

## 2013-11-30 VITALS — BP 110/59 | HR 88 | Temp 98.0°F | Ht 64.0 in | Wt 332.0 lb

## 2013-11-30 DIAGNOSIS — J069 Acute upper respiratory infection, unspecified: Secondary | ICD-10-CM

## 2013-11-30 DIAGNOSIS — I2699 Other pulmonary embolism without acute cor pulmonale: Secondary | ICD-10-CM

## 2013-11-30 MED ORDER — AMOXICILLIN 500 MG PO CAPS: 500.0000 mg | ORAL_CAPSULE | Freq: Two times a day (BID) | ORAL | Status: AC

## 2013-11-30 NOTE — Progress Notes (Signed)
   Subjective:    Patient ID: Kathleen [name], female    DOB: 1946-10-05, 68 y.o.   MRN: [id number]  Cough This is a new problem. The current episode started in the past 7 days. The problem has been unchanged. The cough is non-productive. Associated symptoms include nasal congestion and postnasal drip. Pertinent negatives include no chest pain, chills, fever, headaches, shortness of breath or wheezing. The symptoms are aggravated by lying down. She has tried nothing for the symptoms. There is no history of asthma, bronchitis or pneumonia.      Review of Systems  Constitutional: Negative for fever and chills.  HENT: Positive for congestion and postnasal drip.   Respiratory: Positive for cough. Negative for chest tightness, shortness of breath and wheezing.   Cardiovascular: Negative for chest pain and palpitations.  Neurological: Negative for dizziness, weakness and headaches.       Objective:   Physical Exam  Constitutional: She is oriented to person, place, and time. She appears well-developed and well-nourished.  Cardiovascular: Normal rate, regular rhythm and normal heart sounds.   Pulmonary/Chest: Effort normal and breath sounds normal. No respiratory distress. She exhibits no tenderness.  Neurological: She is alert and oriented to person, place, and time.  Skin: Skin is warm and dry.  Psychiatric: She has a normal mood and affect.          Assessment & Plan:  1. Upper respiratory infection - amoxicillin (AMOXIL) 500 MG capsule; Take 1 capsule (500 mg total) by mouth 2 (two) times daily.  Dispense: 20 capsule; Refill: 0 -Continue mucinex OTC as directed Patient verbalized understanding Coralie KeensMae E. Felicity Penix, FNP-C

## 2013-11-30 NOTE — Patient Instructions (Signed)

## 2013-12-01 ENCOUNTER — Other Ambulatory Visit: Payer: Self-pay

## 2013-12-01 ENCOUNTER — Ambulatory Visit (INDEPENDENT_AMBULATORY_CARE_PROVIDER_SITE_OTHER): Payer: Medicare Other | Admitting: Pharmacist

## 2013-12-01 DIAGNOSIS — Z7901 Long term (current) use of anticoagulants: Secondary | ICD-10-CM

## 2013-12-01 DIAGNOSIS — I2699 Other pulmonary embolism without acute cor pulmonale: Secondary | ICD-10-CM

## 2013-12-01 LAB — PROTIME-INR
INR: 2 — AB (ref 0.8–1.2)
Prothrombin Time: 20.9 s — ABNORMAL HIGH (ref 9.1–12.0)

## 2013-12-01 NOTE — Progress Notes (Signed)
Patient had INR drawn in office 11/30/13.  Results came in today.  Patient called and instructed to continue warfarin at current dose of 3mg  or 1 tablet daily. Henrene Pastorammy Ashanti Littles, PharmD, CPP

## 2013-12-07 ENCOUNTER — Other Ambulatory Visit: Payer: Self-pay | Admitting: General Practice

## 2013-12-14 ENCOUNTER — Ambulatory Visit: Payer: Medicare Other | Admitting: Family Medicine

## 2013-12-14 ENCOUNTER — Other Ambulatory Visit: Payer: Self-pay

## 2013-12-14 ENCOUNTER — Other Ambulatory Visit: Payer: Medicare Other

## 2013-12-15 ENCOUNTER — Encounter: Payer: Self-pay | Admitting: General Practice

## 2013-12-15 ENCOUNTER — Ambulatory Visit (INDEPENDENT_AMBULATORY_CARE_PROVIDER_SITE_OTHER): Payer: Medicare Other | Admitting: General Practice

## 2013-12-15 VITALS — BP 116/68 | HR 87 | Temp 97.6°F | Ht 64.0 in | Wt 333.0 lb

## 2013-12-15 DIAGNOSIS — K219 Gastro-esophageal reflux disease without esophagitis: Secondary | ICD-10-CM

## 2013-12-15 DIAGNOSIS — I2699 Other pulmonary embolism without acute cor pulmonale: Secondary | ICD-10-CM

## 2013-12-15 DIAGNOSIS — Z7901 Long term (current) use of anticoagulants: Secondary | ICD-10-CM

## 2013-12-15 MED ORDER — OMEPRAZOLE 20 MG PO CPDR: 20.0000 mg | DELAYED_RELEASE_CAPSULE | Freq: Every day | ORAL | Status: AC

## 2013-12-15 NOTE — Patient Instructions (Signed)

## 2013-12-15 NOTE — Progress Notes (Signed)
   Subjective:    Patient ID: Kathleen [name], female    DOB: 11/20/1945, 68 y.o.   MRN: [id number]  Gastrophageal Reflux She complains of belching, coughing and nausea. She reports no abdominal pain, no chest pain, no hoarse voice or no wheezing. The current episode started in the past 7 days. The problem occurs occasionally. The problem has been unchanged. The symptoms are aggravated by certain foods.      Review of Systems  Constitutional: Negative for fever and chills.  HENT: Negative for hoarse voice.   Respiratory: Positive for cough. Negative for chest tightness and wheezing.   Cardiovascular: Negative for chest pain.  Gastrointestinal: Positive for nausea. Negative for vomiting, abdominal pain, diarrhea, constipation and blood in stool.  Neurological: Negative for dizziness, weakness and headaches.       Objective:   Physical Exam  Constitutional: She is oriented to person, place, and time. She appears well-developed and well-nourished.  Cardiovascular: Normal rate, regular rhythm and normal heart sounds.   Pulmonary/Chest: Effort normal and breath sounds normal. No respiratory distress. She exhibits no tenderness.  Abdominal: Soft. Bowel sounds are normal. She exhibits no distension. There is no tenderness.  Neurological: She is alert and oriented to person, place, and time.          Assessment & Plan:  1. Pulmonary embolism, 2. Chronic anticoagulation  - Protime-INR  3. GERD (gastroesophageal reflux disease)  - omeprazole (PRILOSEC) 20 MG capsule; Take 1 capsule (20 mg total) by mouth daily.  Dispense: 30 capsule; Refill: 5 -discussed appropriate foods for GERD -RTO if symptoms worsen or unresolved May seek emergency medical treatment Patient verbalized understanding Coralie KeensMae E. Clela Hagadorn, FNP-C

## 2013-12-16 ENCOUNTER — Ambulatory Visit: Payer: Self-pay | Admitting: Pharmacist

## 2013-12-16 DIAGNOSIS — I82409 Acute embolism and thrombosis of unspecified deep veins of unspecified lower extremity: Secondary | ICD-10-CM

## 2013-12-16 LAB — PROTIME-INR
INR: 2 — ABNORMAL HIGH (ref 0.8–1.2)
Prothrombin Time: 20.9 s — ABNORMAL HIGH (ref 9.1–12.0)

## 2013-12-16 NOTE — Progress Notes (Signed)
No change for visit - patient had INR drawn yesterday at appt with Ruthell RummageMae Martin, NP.  Received results today and patient called with results.

## 2013-12-20 ENCOUNTER — Other Ambulatory Visit: Payer: Self-pay | Admitting: General Practice

## 2013-12-27 ENCOUNTER — Other Ambulatory Visit: Payer: Self-pay | Admitting: General Practice

## 2013-12-30 ENCOUNTER — Other Ambulatory Visit: Payer: Self-pay | Admitting: *Deleted

## 2013-12-30 MED ORDER — AMIODARONE HCL 200 MG PO TABS: 400.0000 mg | ORAL_TABLET | Freq: Every day | ORAL | Status: DC

## 2014-01-05 ENCOUNTER — Other Ambulatory Visit (INDEPENDENT_AMBULATORY_CARE_PROVIDER_SITE_OTHER): Payer: Medicare Other

## 2014-01-05 DIAGNOSIS — I82409 Acute embolism and thrombosis of unspecified deep veins of unspecified lower extremity: Secondary | ICD-10-CM

## 2014-01-06 ENCOUNTER — Ambulatory Visit (INDEPENDENT_AMBULATORY_CARE_PROVIDER_SITE_OTHER): Payer: Medicare Other | Admitting: Pharmacist

## 2014-01-06 ENCOUNTER — Telehealth: Payer: Self-pay | Admitting: Pharmacist

## 2014-01-06 LAB — PROTIME-INR
INR: 1.8 — ABNORMAL HIGH (ref 0.8–1.2)
PROTHROMBIN TIME: 18.5 s — AB (ref 9.1–12.0)

## 2014-01-06 NOTE — Progress Notes (Signed)
No charge for visit - for documentation purposes only.  INR was drawn and sent out 01/05/14.   Called patient today with warfarin directions.

## 2014-01-06 NOTE — Telephone Encounter (Signed)
Patient called with INR results and discussed warfarin dosing . See anticoag note.

## 2014-01-11 ENCOUNTER — Ambulatory Visit (INDEPENDENT_AMBULATORY_CARE_PROVIDER_SITE_OTHER): Payer: Medicare Other | Admitting: General Practice

## 2014-01-11 ENCOUNTER — Other Ambulatory Visit: Payer: Self-pay | Admitting: General Practice

## 2014-01-11 ENCOUNTER — Telehealth: Payer: Self-pay | Admitting: General Practice

## 2014-01-11 ENCOUNTER — Encounter: Payer: Self-pay | Admitting: General Practice

## 2014-01-11 VITALS — BP 110/59 | HR 84 | Temp 98.0°F | Ht 64.0 in | Wt 345.8 lb

## 2014-01-11 DIAGNOSIS — L02419 Cutaneous abscess of limb, unspecified: Secondary | ICD-10-CM

## 2014-01-11 DIAGNOSIS — L03119 Cellulitis of unspecified part of limb: Secondary | ICD-10-CM

## 2014-01-11 DIAGNOSIS — L089 Local infection of the skin and subcutaneous tissue, unspecified: Secondary | ICD-10-CM

## 2014-01-11 DIAGNOSIS — L03115 Cellulitis of right lower limb: Secondary | ICD-10-CM

## 2014-01-11 DIAGNOSIS — L24A9 Irritant contact dermatitis due friction or contact with other specified body fluids: Secondary | ICD-10-CM

## 2014-01-11 DIAGNOSIS — L309 Dermatitis, unspecified: Secondary | ICD-10-CM

## 2014-01-11 DIAGNOSIS — T148XXA Other injury of unspecified body region, initial encounter: Secondary | ICD-10-CM

## 2014-01-11 DIAGNOSIS — L03116 Cellulitis of left lower limb: Secondary | ICD-10-CM

## 2014-01-11 MED ORDER — TRIAMCINOLONE ACETONIDE 0.1 % EX CREA: 1.0000 "application " | TOPICAL_CREAM | Freq: Two times a day (BID) | CUTANEOUS | Status: AC

## 2014-01-11 MED ORDER — CEPHALEXIN 500 MG PO CAPS: 500.0000 mg | ORAL_CAPSULE | Freq: Two times a day (BID) | ORAL | Status: DC

## 2014-01-11 MED ORDER — CEPHALEXIN 500 MG PO CAPS: 500.0000 mg | ORAL_CAPSULE | Freq: Three times a day (TID) | ORAL | Status: DC

## 2014-01-11 NOTE — Patient Instructions (Signed)
Cellulitis Cellulitis is an infection of the skin and the tissue beneath it. The infected area is usually red and tender. Cellulitis occurs most often in the arms and lower legs.  CAUSES  Cellulitis is caused by bacteria that enter the skin through cracks or cuts in the skin. The most common types of bacteria that cause cellulitis are Staphylococcus and Streptococcus. SYMPTOMS   Redness and warmth.  Swelling.  Tenderness or pain.  Fever. DIAGNOSIS  Your caregiver can usually determine what is wrong based on a physical exam. Blood tests may also be done. TREATMENT  Treatment usually involves taking an antibiotic medicine. HOME CARE INSTRUCTIONS   Take your antibiotics as directed. Finish them even if you start to feel better.  Keep the infected arm or leg elevated to reduce swelling.  Apply a warm cloth to the affected area up to 4 times per day to relieve pain.  Only take over-the-counter or prescription medicines for pain, discomfort, or fever as directed by your caregiver.  Keep all follow-up appointments as directed by your caregiver. SEEK MEDICAL CARE IF:   You notice red streaks coming from the infected area.  Your red area gets larger or turns dark in color.  Your bone or joint underneath the infected area becomes painful after the skin has healed.  Your infection returns in the same area or another area.  You notice a swollen bump in the infected area.  You develop new symptoms. SEEK IMMEDIATE MEDICAL CARE IF:   You have a fever.  You feel very sleepy.  You develop vomiting or diarrhea.  You have a general ill feeling (malaise) with muscle aches and pains. MAKE SURE YOU:   Understand these instructions.  Will watch your condition.  Will get help right away if you are not doing well or get worse. Document Released: 07/17/2005 Document Revised: 04/07/2012 Document Reviewed: 12/23/2011 Exit[employer] Patient Information 2014 [employer].  

## 2014-01-11 NOTE — Telephone Encounter (Signed)
Done

## 2014-01-11 NOTE — Progress Notes (Signed)
   Subjective:    Patient ID: Kathleen [name], female    DOB: [birth year], 68 y.o.   MRN: [id number]  HPI Patient present today with complaints of lower leg redness and swelling. She has a history of cellulitis. Reports hitting her left leg with the walker on Saturday and broke a small area of skin. Noted small amount of bleeding, which stopped after 1-2 minutes. Reports she cleansed and applied light guaze bandage.     Review of Systems  Constitutional: Negative for fever and chills.  Respiratory: Negative for chest tightness and shortness of breath.   Cardiovascular: Negative for chest pain and palpitations.  Skin:       Redness and swelling to lower legs       Objective:   Physical Exam  Constitutional: She is oriented to person, place, and time. She appears well-developed and well-nourished.  Obese   Cardiovascular: Normal rate, regular rhythm and normal heart sounds.   Pulmonary/Chest: Effort normal and breath sounds normal. No respiratory distress. She exhibits no tenderness.  Neurological: She is alert and oriented to person, place, and time.  Skin: Skin is warm and dry. There is erythema.  Erythema, warmth, and 1+ non pitting edema noted to bilateral lower legs. Small amount of serous drainage noted to right lower leg.   Psychiatric: She has a normal mood and affect.          Assessment & Plan:  1. Wound drainage  - Aerobic culture  2. Skin infection  - Aerobic culture  3. Bilateral cellulitis of lower leg  - cephALEXin (KEFLEX) 500 MG capsule; Take 1 capsule (500 mg total) by mouth 2 (two) times daily.  Dispense: 20 capsule; Refill: 0 -discussed proper hygiene -home care instructions provided and discussed -cleansed bilateral lower legs with normal saline -will refer to wound clinic if symptoms worsen or unresolved -RTO on Friday for follow up -Patient verbalized understanding Coralie KeensMae E. Jones Viviani, FNP-C

## 2014-01-13 LAB — AEROBIC CULTURE

## 2014-01-14 ENCOUNTER — Ambulatory Visit (INDEPENDENT_AMBULATORY_CARE_PROVIDER_SITE_OTHER): Payer: Medicare Other | Admitting: General Practice

## 2014-01-14 ENCOUNTER — Encounter: Payer: Self-pay | Admitting: General Practice

## 2014-01-14 VITALS — BP 111/66 | HR 85 | Temp 98.5°F | Ht 64.0 in | Wt 345.0 lb

## 2014-01-14 DIAGNOSIS — L03115 Cellulitis of right lower limb: Secondary | ICD-10-CM

## 2014-01-14 DIAGNOSIS — L03119 Cellulitis of unspecified part of limb: Secondary | ICD-10-CM

## 2014-01-14 DIAGNOSIS — L03116 Cellulitis of left lower limb: Principal | ICD-10-CM

## 2014-01-14 DIAGNOSIS — L02419 Cutaneous abscess of limb, unspecified: Secondary | ICD-10-CM

## 2014-01-16 NOTE — Progress Notes (Signed)
   Subjective:    Patient ID: Kathleen [name], female    DOB: 03/20/1946, 68 y.o.   MRN: [id number]  HPI Presents today for follow up of bilateral lower leg cellulitis. Denies worsening of symptoms.     Review of Systems  Constitutional: Negative for fever and chills.  Respiratory: Negative for chest tightness and shortness of breath.   Cardiovascular: Negative for chest pain and palpitations.  Skin:       Redness and swelling to lower legs       Objective:   Physical Exam  Constitutional: She is oriented to person, place, and time. She appears well-developed and well-nourished.  Obese   Cardiovascular: Normal rate, regular rhythm and normal heart sounds.   Pulmonary/Chest: Effort normal and breath sounds normal. No respiratory distress. She exhibits no tenderness.  Neurological: She is alert and oriented to person, place, and time.  Skin: Skin is warm and dry. There is erythema.  Erythema, warmth, and 1+ non pitting edema noted to bilateral lower legs. Small amount of serous drainage noted to right lower leg.   Psychiatric: She has a normal mood and affect.          Assessment & Plan:  1. Bilateral lower leg cellulitis - Ambulatory referral to Wound Clinic -continue taking medications as prescribed -RTO on Wednesday for follow up, if not seen by wound clinic prior to  Patient verbalized understanding Coralie KeensMae E. Gregory Barrick, FNP-C

## 2014-01-17 ENCOUNTER — Other Ambulatory Visit: Payer: Self-pay | Admitting: General Practice

## 2014-01-18 NOTE — Telephone Encounter (Signed)
Last ov 3/15

## 2014-01-19 ENCOUNTER — Encounter: Payer: Self-pay | Admitting: General Practice

## 2014-01-19 ENCOUNTER — Other Ambulatory Visit: Payer: Medicare Other

## 2014-01-19 ENCOUNTER — Ambulatory Visit (INDEPENDENT_AMBULATORY_CARE_PROVIDER_SITE_OTHER): Payer: Medicare Other | Admitting: General Practice

## 2014-01-19 VITALS — BP 129/77 | HR 87 | Temp 97.6°F | Ht 64.0 in | Wt 341.0 lb

## 2014-01-19 DIAGNOSIS — I2699 Other pulmonary embolism without acute cor pulmonale: Secondary | ICD-10-CM

## 2014-01-19 DIAGNOSIS — L0291 Cutaneous abscess, unspecified: Secondary | ICD-10-CM

## 2014-01-19 DIAGNOSIS — L0390 Cellulitis, unspecified: Secondary | ICD-10-CM

## 2014-01-19 NOTE — Progress Notes (Signed)
   Subjective:    Patient ID: Kathleen [name], female    DOB: 1945/11/01, 68 y.o.   MRN: [id number]  HPI Patient presents today for follow up of bilateral lower leg cellulitis. Reports seeing improvement gradually. Denies fever.     Review of Systems  Constitutional: Negative for fever and chills.  Respiratory: Negative for chest tightness and shortness of breath.        Wears oxygen at night  Cardiovascular: Positive for leg swelling. Negative for chest pain.  Skin:       Redness and swelling to both lower legs.        Objective:   Physical Exam  Constitutional: She is oriented to person, place, and time.  obese  Cardiovascular: Normal rate, regular rhythm and normal heart sounds.   Pulmonary/Chest: Effort normal and breath sounds normal. No respiratory distress. She exhibits no tenderness.  Neurological: She is alert and oriented to person, place, and time.  Skin: Skin is warm and dry.  Mild erythema and +1 pitting edema noted to bilateral lower legs. Negative warmth.           Assessment & Plan:  1. Pulmonary embolism  - Protime-INR  2. Cellulitis -Continue with home care instructions -Maintain appointment with wound care clinic on Wednesday -RTO if symptoms worsen -Patient verbalized understanding Coralie KeensMae E. Ehsan Corvin, FNP-C

## 2014-01-20 ENCOUNTER — Telehealth: Payer: Self-pay | Admitting: General Practice

## 2014-01-20 ENCOUNTER — Ambulatory Visit (INDEPENDENT_AMBULATORY_CARE_PROVIDER_SITE_OTHER): Payer: Medicare Other | Admitting: Pharmacist

## 2014-01-20 ENCOUNTER — Other Ambulatory Visit: Payer: Self-pay

## 2014-01-20 ENCOUNTER — Telehealth: Payer: Self-pay | Admitting: Pharmacist

## 2014-01-20 LAB — PROTIME-INR
INR: 2.2 — ABNORMAL HIGH (ref 0.8–1.2)
PROTHROMBIN TIME: 22.3 s — AB (ref 9.1–12.0)

## 2014-01-20 MED ORDER — PREDNISONE 5 MG PO TABS: 5.0000 mg | ORAL_TABLET | Freq: Every day | ORAL | Status: DC

## 2014-01-20 NOTE — Telephone Encounter (Signed)
Patient called about INR results from yesterday.  Denies s/s bleeding.  Started cephalexin 01/11/14. Continue current warfarin dose of 3mg  daily. RTC in 3 weeks.

## 2014-01-26 ENCOUNTER — Encounter (HOSPITAL_BASED_OUTPATIENT_CLINIC_OR_DEPARTMENT_OTHER): Payer: Medicare Other | Attending: General Surgery

## 2014-01-26 DIAGNOSIS — J449 Chronic obstructive pulmonary disease, unspecified: Secondary | ICD-10-CM | POA: Insufficient documentation

## 2014-01-26 DIAGNOSIS — I739 Peripheral vascular disease, unspecified: Secondary | ICD-10-CM | POA: Insufficient documentation

## 2014-01-26 DIAGNOSIS — I509 Heart failure, unspecified: Secondary | ICD-10-CM | POA: Insufficient documentation

## 2014-01-26 DIAGNOSIS — I10 Essential (primary) hypertension: Secondary | ICD-10-CM | POA: Insufficient documentation

## 2014-01-26 DIAGNOSIS — H409 Unspecified glaucoma: Secondary | ICD-10-CM | POA: Insufficient documentation

## 2014-01-26 DIAGNOSIS — I872 Venous insufficiency (chronic) (peripheral): Secondary | ICD-10-CM | POA: Insufficient documentation

## 2014-01-26 DIAGNOSIS — I890 Lymphedema, not elsewhere classified: Secondary | ICD-10-CM | POA: Insufficient documentation

## 2014-01-26 DIAGNOSIS — Z7901 Long term (current) use of anticoagulants: Secondary | ICD-10-CM | POA: Insufficient documentation

## 2014-01-26 DIAGNOSIS — Z79899 Other long term (current) drug therapy: Secondary | ICD-10-CM | POA: Insufficient documentation

## 2014-01-26 DIAGNOSIS — J4489 Other specified chronic obstructive pulmonary disease: Secondary | ICD-10-CM | POA: Insufficient documentation

## 2014-01-26 DIAGNOSIS — L03119 Cellulitis of unspecified part of limb: Secondary | ICD-10-CM

## 2014-01-26 DIAGNOSIS — L02419 Cutaneous abscess of limb, unspecified: Secondary | ICD-10-CM | POA: Insufficient documentation

## 2014-01-26 NOTE — Progress Notes (Signed)
[facility]  NAME:  Ronal FearHARRIS, Merilyn                 ACCOUNT NO.:  [id number]  MEDICAL RECORD NO.:  [id number]      DATE OF BIRTH:  11-17-1945  PHYSICIAN:  Ardath SaxPeter Zavien Clubb, M.D.      VISIT DATE:  01/26/2014                                  OFFICE VISIT   A 68 year old morbidly obese female, who has many problems including COPD, congestive heart failure, peripheral vascular disease, hypertension, and glaucoma.  She has had multiple surgeries in the past. Today, her vital signs showed a blood pressure 136/70, respirations 16, pulse 79, temperature 98.  She weighs 334 pounds and she is 5 feet 4 inches.  Her medicines are amiodarone, vitamins, Lipitor.  She is on Keflex, Colace, Myrbetriq, MiraLax, K-Dur, Deltasone, Demadex, Coumadin, and Prilosec.  On examination, she is of course morbidly obese, with markedly swollen legs with lymphedema and chronic venous stasis.  She has had a venous workup which shows no thrombosis, but she does have reflux of the greater saphenous veins bilaterally.  Her right leg today has a great deal of cellulitis which is being treated by Keflex by her doctor.  I put her in Unna boots.  We will continue the Keflex and have her come back in a week.  So, her diagnoses are venous stasis and lymphedema, bilateral legs, with cellulitis of the right leg.  She also has morbid obesity, congestive heart failure, hypertension, COPD.     Ardath SaxPeter Ercelle Winkles, M.D.     PP/MEDQ  D:  01/26/2014  T:  01/26/2014  Job:  [id number]

## 2014-01-27 ENCOUNTER — Other Ambulatory Visit: Payer: Self-pay | Admitting: General Practice

## 2014-02-07 NOTE — Telephone Encounter (Signed)
Pt says she got med.

## 2014-02-08 ENCOUNTER — Encounter: Payer: Self-pay | Admitting: General Practice

## 2014-02-08 ENCOUNTER — Ambulatory Visit (INDEPENDENT_AMBULATORY_CARE_PROVIDER_SITE_OTHER): Payer: Medicare Other | Admitting: General Practice

## 2014-02-08 ENCOUNTER — Other Ambulatory Visit (INDEPENDENT_AMBULATORY_CARE_PROVIDER_SITE_OTHER): Payer: Medicare Other

## 2014-02-08 VITALS — BP 123/67 | HR 88 | Temp 97.9°F | Ht 64.0 in | Wt 333.2 lb

## 2014-02-08 DIAGNOSIS — R7309 Other abnormal glucose: Secondary | ICD-10-CM

## 2014-02-08 DIAGNOSIS — I4891 Unspecified atrial fibrillation: Secondary | ICD-10-CM

## 2014-02-08 DIAGNOSIS — E785 Hyperlipidemia, unspecified: Secondary | ICD-10-CM

## 2014-02-08 DIAGNOSIS — Z86711 Personal history of pulmonary embolism: Secondary | ICD-10-CM

## 2014-02-08 DIAGNOSIS — E559 Vitamin D deficiency, unspecified: Secondary | ICD-10-CM

## 2014-02-08 DIAGNOSIS — Z9989 Dependence on other enabling machines and devices: Secondary | ICD-10-CM

## 2014-02-08 DIAGNOSIS — I10 Essential (primary) hypertension: Secondary | ICD-10-CM

## 2014-02-08 DIAGNOSIS — G4733 Obstructive sleep apnea (adult) (pediatric): Secondary | ICD-10-CM

## 2014-02-08 DIAGNOSIS — R7303 Prediabetes: Secondary | ICD-10-CM

## 2014-02-08 LAB — POCT CBC
Granulocyte percent: 74.5 %G (ref 37–80)
HCT, POC: 42.9 % (ref 37.7–47.9)
Hemoglobin: 14.2 g/dL (ref 12.2–16.2)
Lymph, poc: 1.6 (ref 0.6–3.4)
MCH: 32.7 pg — AB (ref 27–31.2)
MCHC: 33.1 g/dL (ref 31.8–35.4)
MCV: 98.7 fL — AB (ref 80–97)
MPV: 9.4 fL (ref 0–99.8)
POC Granulocyte: 6.3 (ref 2–6.9)
POC LYMPH %: 18.8 % (ref 10–50)
Platelet Count, POC: 207 10*3/uL (ref 142–424)
RBC: 4.3 M/uL (ref 4.04–5.48)
RDW, POC: 14 %
WBC: 8.4 10*3/uL (ref 4.6–10.2)

## 2014-02-08 NOTE — Progress Notes (Signed)
   Subjective:    Patient ID: Kathleen [name], female    DOB: 1946-09-20, 68 y.o.   MRN: [id number]  HPI Patient presents today for chronic health follow up. History of afib, HLD, Hx. PE, asthma, and osa. Taking medications as prescribed. Denies regular exercise, but trying to eat healthier.      Review of Systems  Constitutional: Negative for fever and chills.  Respiratory: Negative for cough, chest tightness and shortness of breath.   Cardiovascular: Negative for chest pain and palpitations.  Gastrointestinal: Negative for vomiting, abdominal pain, diarrhea, constipation and blood in stool.  Genitourinary: Negative for difficulty urinating.  Skin:       Currently wearing una boots bilaterally applied by wound care clinic.   Neurological: Negative for dizziness, weakness and headaches.  Psychiatric/Behavioral: Negative for suicidal ideas, sleep disturbance and self-injury. The patient is not nervous/anxious.        Objective:   Physical Exam  Constitutional: She is oriented to person, place, and time. She appears well-developed and well-nourished.  Morbid obese  HENT:  Head: Normocephalic and atraumatic.  Right Ear: External ear normal.  Left Ear: External ear normal.  Neck: Normal range of motion. Neck supple. No thyromegaly present.  Cardiovascular: Normal rate, regular rhythm and normal heart sounds.   Pulmonary/Chest: Effort normal and breath sounds normal. No respiratory distress. She exhibits no tenderness.  Abdominal: Soft. Bowel sounds are normal. She exhibits no distension. There is no tenderness.  Lymphadenopathy:    She has no cervical adenopathy.  Neurological: She is alert and oriented to person, place, and time.  Skin: Skin is warm and dry.  Psychiatric: She has a normal mood and affect.          Assessment & Plan:  1. Vitamin D deficiency  - Vit D  25 hydroxy (rtn osteoporosis monitoring)  2. Hyperlipidemia   3. Hx pulmonary embolism   4. OSA on  CPAP   5. Hypertension Continue all current medications Labs pending F/u in 3 months Discussed benefits of regular exercise and healthy eating Patient verbalized understanding Coralie KeensMae E. Areeb Corron, FNP-C

## 2014-02-08 NOTE — Progress Notes (Signed)
Pt came in for labs only 

## 2014-02-08 NOTE — Addendum Note (Signed)
Addended by: Prescott GumLAND, Jamielynn Wigley M on: 02/08/2014 08:42 AM   Modules accepted: Orders

## 2014-02-08 NOTE — Patient Instructions (Signed)

## 2014-02-09 ENCOUNTER — Telehealth: Payer: Self-pay | Admitting: General Practice

## 2014-02-09 ENCOUNTER — Other Ambulatory Visit: Payer: Self-pay

## 2014-02-09 LAB — CMP14+EGFR
A/G RATIO: 1.1 (ref 1.1–2.5)
ALT: 43 IU/L — ABNORMAL HIGH (ref 0–32)
AST: 36 IU/L (ref 0–40)
Albumin: 3.9 g/dL (ref 3.6–4.8)
Alkaline Phosphatase: 68 IU/L (ref 39–117)
BUN/Creatinine Ratio: 34 — ABNORMAL HIGH (ref 11–26)
BUN: 38 mg/dL — ABNORMAL HIGH (ref 8–27)
CALCIUM: 9 mg/dL (ref 8.7–10.3)
CO2: 31 mmol/L — ABNORMAL HIGH (ref 18–29)
CREATININE: 1.13 mg/dL — AB (ref 0.57–1.00)
Chloride: 95 mmol/L — ABNORMAL LOW (ref 97–108)
GFR calc Af Amer: 57 mL/min/{1.73_m2} — ABNORMAL LOW (ref >59)
GFR, EST NON AFRICAN AMERICAN: 50 mL/min/{1.73_m2} — AB (ref >59)
Globulin, Total: 3.7 g/dL (ref 1.5–4.5)
Glucose: 120 mg/dL — ABNORMAL HIGH (ref 65–99)
Potassium: 4.6 mmol/L (ref 3.5–5.2)
SODIUM: 142 mmol/L (ref 134–144)
Total Bilirubin: 0.3 mg/dL (ref 0.0–1.2)
Total Protein: 7.6 g/dL (ref 6.0–8.5)

## 2014-02-09 LAB — PROTIME-INR
INR: 1.8 — AB (ref 0.8–1.2)
PROTHROMBIN TIME: 18.8 s — AB (ref 9.1–12.0)

## 2014-02-09 LAB — LIPID PANEL
Chol/HDL Ratio: 3.7 ratio units (ref 0.0–4.4)
Cholesterol, Total: 190 mg/dL (ref 100–199)
HDL: 52 mg/dL (ref >39)
LDL Calculated: 94 mg/dL (ref 0–99)
Triglycerides: 219 mg/dL — ABNORMAL HIGH (ref 0–149)
VLDL CHOLESTEROL CAL: 44 mg/dL — AB (ref 5–40)

## 2014-02-09 LAB — VITAMIN D 25 HYDROXY (VIT D DEFICIENCY, FRACTURES): VIT D 25 HYDROXY: 13.8 ng/mL — AB (ref 30.0–100.0)

## 2014-02-09 NOTE — Addendum Note (Signed)
Addended by: Henrene PastorECKARD, Edynn Gillock on: 02/09/2014 04:20 PM   Modules accepted: Level of Service

## 2014-02-09 NOTE — Telephone Encounter (Signed)
INR was 1.8 from yesterday.  Patient instructed to take 2 tablets every Wednesdays and 1 tablet all other day.  Recheck in 2 weeks. Patient called.

## 2014-02-14 ENCOUNTER — Other Ambulatory Visit: Payer: Self-pay | Admitting: General Practice

## 2014-02-14 DIAGNOSIS — E559 Vitamin D deficiency, unspecified: Secondary | ICD-10-CM

## 2014-02-14 MED ORDER — VITAMIN D3 1.25 MG (50000 UT) PO CAPS: 1.0000 | ORAL_CAPSULE | ORAL | Status: AC

## 2014-02-14 NOTE — Addendum Note (Signed)
Addended by: Philomena DohenyHALIBURTON, Dorothye Berni E on: 02/14/2014 07:54 AM   Modules accepted: Orders

## 2014-02-22 ENCOUNTER — Other Ambulatory Visit (INDEPENDENT_AMBULATORY_CARE_PROVIDER_SITE_OTHER): Payer: Medicare Other

## 2014-02-22 DIAGNOSIS — I2699 Other pulmonary embolism without acute cor pulmonale: Secondary | ICD-10-CM

## 2014-02-22 NOTE — Progress Notes (Signed)
Pt came in for labs only 

## 2014-02-23 ENCOUNTER — Telehealth: Payer: Self-pay | Admitting: Pharmacist

## 2014-02-23 ENCOUNTER — Ambulatory Visit: Payer: Medicare Other | Admitting: Pharmacist

## 2014-02-23 LAB — PROTIME-INR
INR: 2.6 — ABNORMAL HIGH (ref 0.8–1.2)
Prothrombin Time: 26.9 s — ABNORMAL HIGH (ref 9.1–12.0)

## 2014-02-23 NOTE — Telephone Encounter (Signed)
Patient called about INR results and warfarin dosing - see anticoagulation note for more details.

## 2014-02-23 NOTE — Progress Notes (Signed)
Patient comes in for INR which is sent to outside labs. Called regarding results from 02/22/14

## 2014-02-25 ENCOUNTER — Encounter (HOSPITAL_BASED_OUTPATIENT_CLINIC_OR_DEPARTMENT_OTHER): Payer: Medicare Other | Attending: General Surgery

## 2014-02-25 DIAGNOSIS — I890 Lymphedema, not elsewhere classified: Secondary | ICD-10-CM | POA: Insufficient documentation

## 2014-02-25 DIAGNOSIS — E669 Obesity, unspecified: Secondary | ICD-10-CM | POA: Insufficient documentation

## 2014-02-25 DIAGNOSIS — L97809 Non-pressure chronic ulcer of other part of unspecified lower leg with unspecified severity: Secondary | ICD-10-CM | POA: Insufficient documentation

## 2014-02-28 ENCOUNTER — Other Ambulatory Visit: Payer: Self-pay | Admitting: General Practice

## 2014-03-03 ENCOUNTER — Telehealth: Payer: Self-pay | Admitting: General Practice

## 2014-03-03 NOTE — Telephone Encounter (Signed)
Left wrist pain x 1 week. No known injury but patient does use a walker.  Appt scheduled for tomorrow. Patient aware.

## 2014-03-04 ENCOUNTER — Ambulatory Visit (INDEPENDENT_AMBULATORY_CARE_PROVIDER_SITE_OTHER): Payer: Medicare Other

## 2014-03-04 ENCOUNTER — Encounter: Payer: Self-pay | Admitting: Physician Assistant

## 2014-03-04 ENCOUNTER — Ambulatory Visit (INDEPENDENT_AMBULATORY_CARE_PROVIDER_SITE_OTHER): Payer: Medicare Other | Admitting: Physician Assistant

## 2014-03-04 VITALS — BP 117/58 | HR 93 | Temp 98.1°F | Ht 64.0 in | Wt 345.8 lb

## 2014-03-04 DIAGNOSIS — M25539 Pain in unspecified wrist: Secondary | ICD-10-CM

## 2014-03-04 NOTE — Patient Instructions (Signed)
Wrist Pain Wrist injuries are frequent in adults and children. A sprain is an injury to the ligaments that hold your bones together. A strain is an injury to muscle or muscle cord-like structures (tendons) from stretching or pulling. Generally, when wrists are moderately tender to touch following a fall or injury, a break in the bone (fracture) may be present. Most wrist sprains or strains are better in 3 to 5 days, but complete healing may take several weeks. HOME CARE INSTRUCTIONS   Put ice on the injured area.  Put ice in a plastic bag.  Place a towel between your skin and the bag.  Leave the ice on for 15-20 minutes, 03-04 times a day, for the first 2 days.  Keep your arm raised above the level of your heart whenever possible to reduce swelling and pain.  Rest the injured area for at least 48 hours or as directed by your caregiver.  If a splint or elastic bandage has been applied, use it for as long as directed by your caregiver or until seen by a caregiver for a follow-up exam.  Only take over-the-counter or prescription medicines for pain, discomfort, or fever as directed by your caregiver.  Keep all follow-up appointments. You may need to follow up with a specialist or have follow-up X-rays. Improvement in pain level is not a guarantee that you did not fracture a bone in your wrist. The only way to determine whether or not you have a broken bone is by X-ray. SEEK IMMEDIATE MEDICAL CARE IF:   Your fingers are swollen, very red, white, or cold and blue.  Your fingers are numb or tingling.  You have increasing pain.  You have difficulty moving your fingers. MAKE SURE YOU:   Understand these instructions.  Will watch your condition.  Will get help right away if you are not doing well or get worse. Document Released: 07/17/2005 Document Revised: 12/30/2011 Document Reviewed: 11/28/2010 [employer] Patient Information 2014 [employer].  

## 2014-03-04 NOTE — Progress Notes (Signed)
Subjective:     Patient ID: Kathleen [name], female   DOB: April 08, 1946, 68 y.o.   MRN: [id number]  HPI Pt with lateral pain to the L wrist for 1 week She denies any  Injury to the wrist No swelling to the wrist No OTC tx tried  Review of Systems Denies any numbness to the wrist No loss of grip strength +sx with opening jars     Objective:   Physical Exam No ecchy/edema to the L wrist + TTP over the distal ulnar area Decrease in ROM due to sx but no crepitus Good pulses/sensory Good grip strength Xray- Degen changes but no acute findings    Assessment:     Wrist pain    Plan:     Heat/Ice Pt unable to take NSAIDS due to other meds OTC Tylenol Activities as tol F/U prn

## 2014-03-08 ENCOUNTER — Other Ambulatory Visit (INDEPENDENT_AMBULATORY_CARE_PROVIDER_SITE_OTHER): Payer: Medicare Other

## 2014-03-08 DIAGNOSIS — I4891 Unspecified atrial fibrillation: Secondary | ICD-10-CM

## 2014-03-08 NOTE — Progress Notes (Signed)
Pt came in for lab  only 

## 2014-03-09 ENCOUNTER — Ambulatory Visit (INDEPENDENT_AMBULATORY_CARE_PROVIDER_SITE_OTHER): Payer: Medicare Other | Admitting: Pharmacist

## 2014-03-09 ENCOUNTER — Telehealth: Payer: Self-pay | Admitting: General Practice

## 2014-03-09 LAB — PROTIME-INR
INR: 2.2 — ABNORMAL HIGH (ref 0.8–1.2)
Prothrombin Time: 23.1 s — ABNORMAL HIGH (ref 9.1–12.0)

## 2014-03-09 NOTE — Telephone Encounter (Signed)
Patient notified of results of INR drawn yesterday - for more information see anticoag note

## 2014-03-09 NOTE — Progress Notes (Signed)
INR checked in laboratory yesterday and patient called today.  No charge for encounter.

## 2014-03-10 ENCOUNTER — Other Ambulatory Visit: Payer: Self-pay | Admitting: General Practice

## 2014-03-15 ENCOUNTER — Other Ambulatory Visit: Payer: Self-pay | Admitting: General Practice

## 2014-03-23 ENCOUNTER — Encounter (HOSPITAL_BASED_OUTPATIENT_CLINIC_OR_DEPARTMENT_OTHER): Payer: Medicare Other | Attending: General Surgery

## 2014-03-23 DIAGNOSIS — L97809 Non-pressure chronic ulcer of other part of unspecified lower leg with unspecified severity: Secondary | ICD-10-CM | POA: Insufficient documentation

## 2014-03-23 DIAGNOSIS — I890 Lymphedema, not elsewhere classified: Secondary | ICD-10-CM | POA: Insufficient documentation

## 2014-04-04 ENCOUNTER — Telehealth: Payer: Self-pay | Admitting: General Practice

## 2014-04-04 NOTE — Telephone Encounter (Signed)
Spoke with pt regarding hand numbness Will get her in tomorrow with provider

## 2014-04-05 ENCOUNTER — Other Ambulatory Visit (INDEPENDENT_AMBULATORY_CARE_PROVIDER_SITE_OTHER): Payer: Medicare Other

## 2014-04-05 DIAGNOSIS — I82409 Acute embolism and thrombosis of unspecified deep veins of unspecified lower extremity: Secondary | ICD-10-CM

## 2014-04-06 ENCOUNTER — Ambulatory Visit (INDEPENDENT_AMBULATORY_CARE_PROVIDER_SITE_OTHER): Payer: Medicare Other | Admitting: Pharmacist

## 2014-04-06 LAB — PROTIME-INR
INR: 2.6 — AB (ref 0.8–1.2)
Prothrombin Time: 26.6 s — ABNORMAL HIGH (ref 9.1–12.0)

## 2014-04-11 ENCOUNTER — Encounter: Payer: Self-pay | Admitting: Family

## 2014-04-11 ENCOUNTER — Ambulatory Visit (INDEPENDENT_AMBULATORY_CARE_PROVIDER_SITE_OTHER): Payer: Medicare Other | Admitting: Family

## 2014-04-11 VITALS — BP 144/65 | HR 92 | Temp 98.8°F | Ht 64.0 in | Wt 339.0 lb

## 2014-04-11 DIAGNOSIS — G609 Hereditary and idiopathic neuropathy, unspecified: Secondary | ICD-10-CM

## 2014-04-11 DIAGNOSIS — G629 Polyneuropathy, unspecified: Secondary | ICD-10-CM

## 2014-04-11 MED ORDER — GABAPENTIN 300 MG PO CAPS: 300.0000 mg | ORAL_CAPSULE | Freq: Three times a day (TID) | ORAL | Status: DC

## 2014-04-11 NOTE — Patient Instructions (Signed)

## 2014-04-11 NOTE — Progress Notes (Signed)
   Subjective:    Patient ID: Kathleen [name], female    DOB: 1946-06-01, 68 y.o.   MRN: [id number]  HPI Pt presents to office with numbness in her right fingers for the last few weeks. Pt states it is in her right thumb,  Pointer and middle finer. Pt states it feels like a constant "needles are sticking her". Pt denies any numbness in her other hand or feet. Pt states the pain is worse with movement.    Review of Systems  Constitutional: Negative.   HENT: Negative.   Respiratory: Negative.   Cardiovascular: Negative.   Gastrointestinal: Negative.   Genitourinary: Negative.   Musculoskeletal: Negative.   Neurological: Positive for numbness.  Hematological: Negative.   Psychiatric/Behavioral: Negative.   All other systems reviewed and are negative.      Objective:   Physical Exam  Vitals reviewed. Constitutional: She is oriented to person, place, and time. She appears well-developed and well-nourished. No distress.  Cardiovascular: Normal rate, regular rhythm, normal heart sounds and intact distal pulses.   No murmur heard. Pulmonary/Chest: Effort normal and breath sounds normal. No respiratory distress. She has no wheezes.  Abdominal: Soft. Bowel sounds are normal. She exhibits no distension. There is no tenderness.  Musculoskeletal: Normal range of motion. She exhibits no edema and no tenderness.  Neurological: She is alert and oriented to person, place, and time. She has normal reflexes. No cranial nerve deficit.  Skin: Skin is warm and dry.  Psychiatric: She has a normal mood and affect. Her behavior is normal. Judgment and thought content normal.     BP 144/65  Pulse 92  Temp(Src) 98.8 F (37.1 C) (Oral)  Ht 5\' 4"  (1.626 m)  Wt 339 lb (153.769 kg)  BMI 58.16 kg/m2      Assessment & Plan:  1. Peripheral neuropathy -Check for any injuries or infections in numb areas daily -Do not do anything that puts pressure on your damaged nerves -Take medication as  prescribed - gabapentin (NEURONTIN) 300 MG capsule; Take 1 capsule (300 mg total) by mouth 3 (three) times daily.   Dispense: 90 capsule; Refill: 3  -RTO prn  Jannifer Rodneyhristy Tayt Moyers, FNP

## 2014-05-02 ENCOUNTER — Other Ambulatory Visit: Payer: Self-pay | Admitting: Nurse Practitioner

## 2014-05-03 ENCOUNTER — Other Ambulatory Visit (INDEPENDENT_AMBULATORY_CARE_PROVIDER_SITE_OTHER): Payer: Medicare Other

## 2014-05-03 DIAGNOSIS — I2699 Other pulmonary embolism without acute cor pulmonale: Secondary | ICD-10-CM

## 2014-05-03 DIAGNOSIS — Z7901 Long term (current) use of anticoagulants: Secondary | ICD-10-CM

## 2014-05-04 ENCOUNTER — Telehealth: Payer: Self-pay | Admitting: Pharmacist

## 2014-05-04 ENCOUNTER — Ambulatory Visit (INDEPENDENT_AMBULATORY_CARE_PROVIDER_SITE_OTHER): Payer: Medicare Other | Admitting: Pharmacist

## 2014-05-04 LAB — PROTIME-INR
INR: 3.2 — ABNORMAL HIGH (ref 0.8–1.2)
PROTHROMBIN TIME: 33.1 s — AB (ref 9.1–12.0)

## 2014-05-04 NOTE — Telephone Encounter (Signed)
patietn called - see anticoagulation note for warfarin adjustment.

## 2014-05-06 ENCOUNTER — Encounter (HOSPITAL_BASED_OUTPATIENT_CLINIC_OR_DEPARTMENT_OTHER): Payer: Medicare Other | Attending: General Surgery

## 2014-05-06 DIAGNOSIS — I872 Venous insufficiency (chronic) (peripheral): Secondary | ICD-10-CM | POA: Insufficient documentation

## 2014-05-06 DIAGNOSIS — I890 Lymphedema, not elsewhere classified: Secondary | ICD-10-CM | POA: Insufficient documentation

## 2014-05-06 DIAGNOSIS — L97809 Non-pressure chronic ulcer of other part of unspecified lower leg with unspecified severity: Secondary | ICD-10-CM | POA: Insufficient documentation

## 2014-05-11 DIAGNOSIS — I872 Venous insufficiency (chronic) (peripheral): Secondary | ICD-10-CM | POA: Diagnosis not present

## 2014-05-11 DIAGNOSIS — L97809 Non-pressure chronic ulcer of other part of unspecified lower leg with unspecified severity: Secondary | ICD-10-CM | POA: Diagnosis not present

## 2014-05-11 DIAGNOSIS — I890 Lymphedema, not elsewhere classified: Secondary | ICD-10-CM | POA: Diagnosis not present

## 2014-05-12 ENCOUNTER — Other Ambulatory Visit: Payer: Self-pay | Admitting: General Practice

## 2014-05-13 ENCOUNTER — Ambulatory Visit (INDEPENDENT_AMBULATORY_CARE_PROVIDER_SITE_OTHER): Payer: Medicare Other | Admitting: Family Medicine

## 2014-05-13 ENCOUNTER — Ambulatory Visit: Payer: Medicare Other | Admitting: Family

## 2014-05-13 ENCOUNTER — Encounter: Payer: Self-pay | Admitting: Family Medicine

## 2014-05-13 VITALS — BP 116/54 | HR 90 | Temp 98.0°F | Ht 64.0 in | Wt 336.8 lb

## 2014-05-13 DIAGNOSIS — E119 Type 2 diabetes mellitus without complications: Secondary | ICD-10-CM

## 2014-05-13 DIAGNOSIS — R5383 Other fatigue: Secondary | ICD-10-CM

## 2014-05-13 DIAGNOSIS — G5600 Carpal tunnel syndrome, unspecified upper limb: Secondary | ICD-10-CM

## 2014-05-13 DIAGNOSIS — K625 Hemorrhage of anus and rectum: Secondary | ICD-10-CM

## 2014-05-13 DIAGNOSIS — R5381 Other malaise: Secondary | ICD-10-CM

## 2014-05-13 DIAGNOSIS — R0602 Shortness of breath: Secondary | ICD-10-CM

## 2014-05-13 DIAGNOSIS — R609 Edema, unspecified: Secondary | ICD-10-CM

## 2014-05-13 DIAGNOSIS — G5601 Carpal tunnel syndrome, right upper limb: Secondary | ICD-10-CM

## 2014-05-13 DIAGNOSIS — N189 Chronic kidney disease, unspecified: Secondary | ICD-10-CM

## 2014-05-13 LAB — POCT CBC
Granulocyte percent: 70.9 %G (ref 37–80)
HCT, POC: 43.1 % (ref 37.7–47.9)
Hemoglobin: 13.1 g/dL (ref 12.2–16.2)
Lymph, poc: 1.7 (ref 0.6–3.4)
MCH, POC: 29.8 pg (ref 27–31.2)
MCHC: 30.4 g/dL — AB (ref 31.8–35.4)
MCV: 98.1 fL — AB (ref 80–97)
MPV: 7 fL (ref 0–99.8)
POC Granulocyte: 5.3 (ref 2–6.9)
POC LYMPH PERCENT: 22.7 %L (ref 10–50)
Platelet Count, POC: 224 10*3/uL (ref 142–424)
RBC: 4.4 M/uL (ref 4.04–5.48)
RDW, POC: 17.1 %
WBC: 7.5 10*3/uL (ref 4.6–10.2)

## 2014-05-13 LAB — POCT GLYCOSYLATED HEMOGLOBIN (HGB A1C): Hemoglobin A1C: 5.9

## 2014-05-13 MED ORDER — METHYLPREDNISOLONE (PAK) 4 MG PO TABS: ORAL_TABLET | ORAL | Status: DC

## 2014-05-13 NOTE — Progress Notes (Signed)
Subjective:    Patient ID: Kathleen [name], female    DOB: 04-30-1946, 68 y.o.   MRN: [id number]  HPI This 68 y.o. female presents for evaluation of rectal bleeding and right hand having pain and pins and needle sensation in her right hand. She is having shortness of breath.  She states she is taking prednisone 61m po qd.  She states she has been on this ever since she got out of the hospital a year ago.  She was hospitalized last year for OSA and acute on chronic respiratory failure.  She had lexiscan myocardial spect imaging study (rest and pharmacologic stress) with left ventricular wall motion study And the results were negative for ischemia and normal left ventricular function with  EF of 75%.  She has been recently referred to Cardiology for her SOB.  She has hx of venous stasis dermatitis due to chronic lower extremity edema.  She has compression stockings that she wears on occasion.  She OSA and reports compliance with this.  She states she uses a nebulizer on occasion for SOB.  She has hx of pulmonary embolism and is on coumadin for anticoagulation.  She is here for INR today which is sent off.  She has hx of CKD and sees nephrology.  She c/o right hand and finger numbness and hemorrhoids and rectal bleeding when she wipes.  She has hx of polyposis and is due for colonoscopy since she missed her GI consult in 2012.  She is living at home and has a home health aide and the church helps out.  She uses a walker to help with ambulation. She states she is having problems tolerating the gabapentin and it makes her sleepy.   Review of Systems No chest pain, SOB, HA, dizziness, vision change, N/V, diarrhea, constipation, dysuria, urinary urgency or frequency, myalgias, arthralgias or rash.     Objective:   Physical Exam  Vital signs noted  Chronically ill appearing obese female in NAD.  HEENT - Head atraumatic Normocephalic                Eyes - PERRLA, Conjuctiva - clear Sclera- Clear EOMI               Ears - EAC's Wnl TM's Wnl Gross Hearing WNL                Throat - oropharanx wnl Respiratory - Lungs CTA bilateral Cardiac - RRR S1 and S2 without murmur GI - Abdomen soft Nontender and bowel sounds active x 4 Extremities - Bilateral anasarca edema with venous stasis dermatitis Neuro - Grossly intact.      Assessment & Plan:  Rectal bleeding - Plan: Ambulatory referral to Gastroenterology and wants to wait before using anusol HC suppositories or cream.  Carpal tunnel syndrome of right wrist - Plan: methylPREDNIsolone (MEDROL DOSPACK) 4 MG tablet, Follow up in 2 weeks and discussed may need cervical spine xray  SOB (shortness of breath) - Plan: Ambulatory referral to Pulmonology.  She has hx of acute on chronic respiratory failure, OSA and SOB.    Type II or unspecified type diabetes mellitus without mention of complication, not stated as uncontrolled - Plan: Check hgbaic  CKD (chronic kidney disease), unspecified stage - Plan check labs and follow up with Nephrology  Edema - Plan: Protime-INR.  Wear venous compression stockings and continue demedex.  Other malaise and fatigue - Plan: TSH, Vitamin B12, Vit D  25 hydroxy (rtn osteoporosis monitoring), POCT CBC, CMP14+EGFR,  POCT glycosylated hemoglobin (Hb A1C), Protime-INR  Follow up in 2-4 weeks.  Lysbeth Penner FNP

## 2014-05-14 LAB — CMP14+EGFR
ALT: 40 IU/L — ABNORMAL HIGH (ref 0–32)
AST: 37 IU/L (ref 0–40)
Albumin/Globulin Ratio: 1 — ABNORMAL LOW (ref 1.1–2.5)
Albumin: 3.7 g/dL (ref 3.6–4.8)
Alkaline Phosphatase: 59 IU/L (ref 39–117)
BUN/Creatinine Ratio: 31 — ABNORMAL HIGH (ref 11–26)
BUN: 30 mg/dL — ABNORMAL HIGH (ref 8–27)
CO2: 27 mmol/L (ref 18–29)
Calcium: 9 mg/dL (ref 8.7–10.3)
Chloride: 96 mmol/L — ABNORMAL LOW (ref 97–108)
Creatinine, Ser: 0.96 mg/dL (ref 0.57–1.00)
GFR calc Af Amer: 70 mL/min/{1.73_m2} (ref >59)
GFR calc non Af Amer: 61 mL/min/{1.73_m2} (ref >59)
Globulin, Total: 3.8 g/dL (ref 1.5–4.5)
Glucose: 111 mg/dL — ABNORMAL HIGH (ref 65–99)
Potassium: 4.5 mmol/L (ref 3.5–5.2)
Sodium: 141 mmol/L (ref 134–144)
Total Bilirubin: 0.2 mg/dL (ref 0.0–1.2)
Total Protein: 7.5 g/dL (ref 6.0–8.5)

## 2014-05-14 LAB — VITAMIN D 25 HYDROXY (VIT D DEFICIENCY, FRACTURES): Vit D, 25-Hydroxy: 13 ng/mL — ABNORMAL LOW (ref 30.0–100.0)

## 2014-05-14 LAB — TSH: TSH: 4.58 u[IU]/mL — ABNORMAL HIGH (ref 0.450–4.500)

## 2014-05-14 LAB — PROTIME-INR
INR: 2.1 — ABNORMAL HIGH (ref 0.8–1.2)
Prothrombin Time: 21.3 s — ABNORMAL HIGH (ref 9.1–12.0)

## 2014-05-14 LAB — VITAMIN B12: Vitamin B-12: 489 pg/mL (ref 211–946)

## 2014-05-16 ENCOUNTER — Telehealth: Payer: Self-pay | Admitting: Pharmacist

## 2014-05-16 ENCOUNTER — Ambulatory Visit: Payer: Self-pay | Admitting: Pharmacist

## 2014-05-16 ENCOUNTER — Other Ambulatory Visit: Payer: Self-pay | Admitting: Family Medicine

## 2014-05-16 ENCOUNTER — Telehealth: Payer: Self-pay | Admitting: Family Medicine

## 2014-05-16 DIAGNOSIS — I2699 Other pulmonary embolism without acute cor pulmonale: Secondary | ICD-10-CM

## 2014-05-16 DIAGNOSIS — Z7901 Long term (current) use of anticoagulants: Secondary | ICD-10-CM

## 2014-05-16 MED ORDER — VITAMIN D (ERGOCALCIFEROL) 1.25 MG (50000 UNIT) PO CAPS: 50000.0000 [IU] | ORAL_CAPSULE | ORAL | Status: DC

## 2014-05-16 NOTE — Telephone Encounter (Signed)
Patient's INR was 2.1.  Advised to continue curent dose - recheck 06/07/2014/

## 2014-05-17 ENCOUNTER — Encounter: Payer: Self-pay | Admitting: Internal Medicine

## 2014-05-18 ENCOUNTER — Other Ambulatory Visit: Payer: Self-pay | Admitting: General Practice

## 2014-05-19 ENCOUNTER — Encounter: Payer: Self-pay | Admitting: Cardiovascular Disease

## 2014-05-19 ENCOUNTER — Ambulatory Visit (INDEPENDENT_AMBULATORY_CARE_PROVIDER_SITE_OTHER): Payer: Medicare Other | Admitting: Cardiovascular Disease

## 2014-05-19 VITALS — BP 122/68 | HR 73 | Ht 65.0 in | Wt 338.0 lb

## 2014-05-19 DIAGNOSIS — Z136 Encounter for screening for cardiovascular disorders: Secondary | ICD-10-CM

## 2014-05-19 DIAGNOSIS — IMO0002 Reserved for concepts with insufficient information to code with codable children: Secondary | ICD-10-CM

## 2014-05-19 DIAGNOSIS — I10 Essential (primary) hypertension: Secondary | ICD-10-CM

## 2014-05-19 DIAGNOSIS — IMO0001 Reserved for inherently not codable concepts without codable children: Secondary | ICD-10-CM

## 2014-05-19 DIAGNOSIS — R0989 Other specified symptoms and signs involving the circulatory and respiratory systems: Secondary | ICD-10-CM

## 2014-05-19 DIAGNOSIS — I480 Paroxysmal atrial fibrillation: Secondary | ICD-10-CM

## 2014-05-19 DIAGNOSIS — R0609 Other forms of dyspnea: Secondary | ICD-10-CM

## 2014-05-19 DIAGNOSIS — Z86711 Personal history of pulmonary embolism: Secondary | ICD-10-CM

## 2014-05-19 DIAGNOSIS — I4891 Unspecified atrial fibrillation: Secondary | ICD-10-CM

## 2014-05-19 DIAGNOSIS — G4733 Obstructive sleep apnea (adult) (pediatric): Secondary | ICD-10-CM

## 2014-05-19 DIAGNOSIS — I83893 Varicose veins of bilateral lower extremities with other complications: Secondary | ICD-10-CM

## 2014-05-19 NOTE — Progress Notes (Signed)
Patient ID: Pecolia AdesMary J Mondo, female   DOB: 02-Dec-1945, 68 y.o.   MRN: [id number]      SUBJECTIVE: The patient is a 68 year old woman who has a past medical history significant for atrial fibrillation, pulmonary embolism, COPD, hypertension, morbid obesity, venous insufficiency, hyperlipidemia, sleep apnea, and diabetes. Previous cardiovascular testing includes a normal Lexiscan nuclear stress test in 09/2013.  Echocardiogram in 09/2013 revealed normal left ventricular systolic function with an ejection fraction of 60-65% and normal diastolic function. There was no significant valvular pathology. She has obesity hypoventilation syndrome and uses oxygen chronically. She also has sleep apnea and uses CPAP at night. She had venous ablation of her lower extremities and has been using wraps and Unna boots for chronic leg swelling and weeping.  She sleeps in a chair due to long-standing orthopnea.  She has baseline dyspnea with exertion which is multifactorial in etiology.  She is scheduled to see a pulmonologist in August. She has dysphagia and reflux after drinking milk.  Review of Systems: As per "subjective", otherwise negative.  Allergies  Allergen Reactions  . Lovenox [Enoxaparin Sodium]     Itching, reddness    Current Outpatient Prescriptions  Medication Sig Dispense Refill  . Amino Acids-Protein Hydrolys (PRO-STAT AWC) LIQD TAKE 30 ML TWICE DAILY  887 mL  0  . amiodarone (PACERONE) 200 MG tablet TAKE (2) TABLETS DAILY  60 tablet  3  . Ascorbic Acid (VITAMIN C) 1000 MG tablet Take 1,000 mg by mouth 2 (two) times daily.      Marland Kitchen. atorvastatin (LIPITOR) 20 MG tablet Take 1 tablet (20 mg total) by mouth daily.  30 tablet  4  . Cholecalciferol (VITAMIN D3) 50000 UNITS CAPS Take 1 capsule by mouth once a week.  8 capsule  0  . clotrimazole (LOTRIMIN) 1 % cream Apply 1 application topically 2 (two) times daily.      Marland Kitchen. docusate sodium (COLACE) 100 MG capsule Take 1 capsule (100 mg total) by mouth 2  (two) times daily.  60 capsule  3  . ipratropium-albuterol (DUONEB) 0.5-2.5 (3) MG/3ML SOLN Take 3 mLs by nebulization 3 (three) times daily.  360 mL  4  . MYRBETRIQ 25 MG TB24 tablet TAKE 2 TABLETS ONCE A DAY FOR NEUROGENIC BLADDER  60 tablet  2  . omeprazole (PRILOSEC) 20 MG capsule Take 1 capsule (20 mg total) by mouth daily.  30 capsule  5  . polyethylene glycol powder (GLYCOLAX/MIRALAX) powder MIX AND DRINK 17 GRAMS IN 8 OUNCES OF LIQUID TWICE A DAY  1054 g  5  . potassium chloride SA (K-DUR,KLOR-CON) 20 MEQ tablet DISSOLVE & TAKE 1 TABLET IN WATER 2 TIMES A DAY FOR POTASSIUM SUPPLEME NT  60 tablet  4  . predniSONE (DELTASONE) 5 MG tablet Take 1 tablet (5 mg total) by mouth daily with breakfast.  30 tablet  3  . torsemide (DEMADEX) 20 MG tablet Take 30 mg by mouth daily.       . Vitamin D, Ergocalciferol, (DRISDOL) 50000 UNITS CAPS capsule Take 1 capsule (50,000 Units total) by mouth every 7 (seven) days.  18 capsule  0  . warfarin (COUMADIN) 3 MG tablet TAKE 1 OR 2 TABLETS ONCE DAILY AS DIRECTED  60 tablet  2   No current facility-administered medications for this visit.    Past Medical History  Diagnosis Date  . HTN (hypertension)   . Hyperlipemia   . GERD (gastroesophageal reflux disease)   . Obesity   . Sleep apnea  she has not been on CPAP  . PE (pulmonary embolism) 05/2011  . DOE (dyspnea on exertion) 01/22/2012  . Hematuria 01/22/2012  . Diabetes mellitus without complication   . A-fib   . Neurogenic bladder   . CHF (congestive heart failure)   . Ulcer     both feet    Past Surgical History  Procedure Laterality Date  . Total abdominal hysterectomy w/ bilateral salpingoophorectomy    . Tonsillectomy    . Arm surgery Right     Broken and had rods inserted which later were removed.    History   Social History  . Marital Status: Widowed    Spouse Name: N/A    Number of Children: 0  . Years of Education: N/A   Occupational History  .      retired Education officer, environmental     Social History Main Topics  . Smoking status: Never Smoker   . Smokeless tobacco: Never Used  . Alcohol Use: No  . Drug Use: No  . Sexual Activity: No   Other Topics Concern  . Not on file   Social History Narrative  . No narrative on file    BP 122/68 Pulse 73    PHYSICAL EXAM General: NAD, morbidly obese  Neck: No JVD, no thyromegaly or thyroid nodule.  Lungs: Clear to auscultation bilaterally with normal respiratory effort.  CV: Nondisplaced PMI. Heart regular S1/S2, no S3/S4, I/VI pansystolic murmur along left sternal border. 1+ leg edema bilaterally but wearing wraps.   Abdomen: Soft, obese, nontender.  Skin: Intact without lesions or rashes.  Neurologic: Alert and oriented x 3.  Psych: Normal affect.  Extremities: No clubbing or cyanosis.  HEENT: Normal.   ECG: reviewed and available in electronic records.      ASSESSMENT AND PLAN: 1. Dyspnea on exertion: While she has several risk factors for coronary artery disease, her Lexiscan Cardiolite nuclear stress test in 09/2013 was normal, as was her echocardiogram. No further testing is indicated. Her current baseline symptoms are multifactorial in etiology, and I suspect a majority stems from cardiovascular deconditioning and obesity-hypoventilation syndrome. She is scheduled to see pulmonology in August.  2. Atrial fibrillation: Currently in a regular rhythm and on amiodarone and warfarin. Liver transaminases reasonable on 02/08/14 and TSH mildly elevated at 4.5 on 05/13/14. Continue to monitor.  3. H/o pulmonary embolism: No evidence of PE on most recent CT angiogram.   4. HTN: Controlled today.   5. Chronic steroid use: Will d/c prednisone as I'm not certain who started, but she is certainly not in need of it as it is only serving to exacerbate her chronic medical issues.  6. Sleep apnea: Uses CPAP.  7. Chronic leg edema: Likely due to both lymphedema and venous insufficiency. No definitive cardiac etiology was  found.  Dispo: f/u 1 year.    Prentice Docker, M.D., F.A.C.C.

## 2014-05-19 NOTE — Patient Instructions (Signed)
   Stop Prednisone. Continue all other medications.    Your physician wants you to follow up in:  1 year.  You will receive a reminder letter in the mail one-two months in advance.  If you don't receive a letter, please call our office to schedule the follow up appointment

## 2014-05-23 ENCOUNTER — Ambulatory Visit (INDEPENDENT_AMBULATORY_CARE_PROVIDER_SITE_OTHER): Payer: Medicare Other

## 2014-05-23 ENCOUNTER — Ambulatory Visit (INDEPENDENT_AMBULATORY_CARE_PROVIDER_SITE_OTHER): Payer: Medicare Other | Admitting: Orthopedic Surgery

## 2014-05-23 ENCOUNTER — Encounter: Payer: Self-pay | Admitting: Orthopedic Surgery

## 2014-05-23 ENCOUNTER — Emergency Department (HOSPITAL_COMMUNITY)
Admission: EM | Admit: 2014-05-23 | Discharge: 2014-05-23 | Disposition: A | Discharge status: Still In Hospital | Payer: Medicare Other

## 2014-05-23 ENCOUNTER — Encounter: Payer: Self-pay | Admitting: Family Medicine

## 2014-05-23 ENCOUNTER — Encounter (HOSPITAL_COMMUNITY): Payer: Self-pay | Admitting: *Deleted

## 2014-05-23 ENCOUNTER — Ambulatory Visit (INDEPENDENT_AMBULATORY_CARE_PROVIDER_SITE_OTHER): Payer: Medicare Other | Admitting: Family Medicine

## 2014-05-23 ENCOUNTER — Other Ambulatory Visit: Payer: Self-pay

## 2014-05-23 ENCOUNTER — Inpatient Hospital Stay (HOSPITAL_COMMUNITY)
Admission: AD | Admit: 2014-05-23 | Discharge: 2014-06-21 | DRG: 872 | Disposition: E | Payer: Medicare Other | Source: Ambulatory Visit | Attending: Internal Medicine | Admitting: Internal Medicine

## 2014-05-23 VITALS — BP 126/66 | HR 89 | Temp 98.1°F | Ht 64.0 in | Wt 335.0 lb

## 2014-05-23 VITALS — BP 121/60 | Ht 64.0 in | Wt 335.0 lb

## 2014-05-23 DIAGNOSIS — Z6841 Body Mass Index (BMI) 40.0 and over, adult: Secondary | ICD-10-CM | POA: Diagnosis not present

## 2014-05-23 DIAGNOSIS — Z90710 Acquired absence of both cervix and uterus: Secondary | ICD-10-CM | POA: Diagnosis not present

## 2014-05-23 DIAGNOSIS — W19XXXA Unspecified fall, initial encounter: Secondary | ICD-10-CM | POA: Diagnosis present

## 2014-05-23 DIAGNOSIS — R112 Nausea with vomiting, unspecified: Secondary | ICD-10-CM | POA: Diagnosis not present

## 2014-05-23 DIAGNOSIS — N319 Neuromuscular dysfunction of bladder, unspecified: Secondary | ICD-10-CM | POA: Diagnosis present

## 2014-05-23 DIAGNOSIS — L03119 Cellulitis of unspecified part of limb: Secondary | ICD-10-CM

## 2014-05-23 DIAGNOSIS — I498 Other specified cardiac arrhythmias: Secondary | ICD-10-CM | POA: Diagnosis not present

## 2014-05-23 DIAGNOSIS — S82899A Other fracture of unspecified lower leg, initial encounter for closed fracture: Secondary | ICD-10-CM

## 2014-05-23 DIAGNOSIS — I739 Peripheral vascular disease, unspecified: Secondary | ICD-10-CM | POA: Diagnosis present

## 2014-05-23 DIAGNOSIS — A419 Sepsis, unspecified organism: Secondary | ICD-10-CM | POA: Diagnosis present

## 2014-05-23 DIAGNOSIS — S8263XA Displaced fracture of lateral malleolus of unspecified fibula, initial encounter for closed fracture: Secondary | ICD-10-CM | POA: Insufficient documentation

## 2014-05-23 DIAGNOSIS — I8310 Varicose veins of unspecified lower extremity with inflammation: Secondary | ICD-10-CM

## 2014-05-23 DIAGNOSIS — E669 Obesity, unspecified: Secondary | ICD-10-CM

## 2014-05-23 DIAGNOSIS — L02419 Cutaneous abscess of limb, unspecified: Secondary | ICD-10-CM | POA: Diagnosis present

## 2014-05-23 DIAGNOSIS — I4891 Unspecified atrial fibrillation: Secondary | ICD-10-CM | POA: Diagnosis present

## 2014-05-23 DIAGNOSIS — S91309A Unspecified open wound, unspecified foot, initial encounter: Secondary | ICD-10-CM | POA: Diagnosis present

## 2014-05-23 DIAGNOSIS — I959 Hypotension, unspecified: Secondary | ICD-10-CM

## 2014-05-23 DIAGNOSIS — S82891A Other fracture of right lower leg, initial encounter for closed fracture: Secondary | ICD-10-CM

## 2014-05-23 DIAGNOSIS — I469 Cardiac arrest, cause unspecified: Secondary | ICD-10-CM | POA: Diagnosis not present

## 2014-05-23 DIAGNOSIS — I872 Venous insufficiency (chronic) (peripheral): Secondary | ICD-10-CM

## 2014-05-23 DIAGNOSIS — E785 Hyperlipidemia, unspecified: Secondary | ICD-10-CM | POA: Diagnosis present

## 2014-05-23 DIAGNOSIS — Z7901 Long term (current) use of anticoagulants: Secondary | ICD-10-CM

## 2014-05-23 DIAGNOSIS — M25579 Pain in unspecified ankle and joints of unspecified foot: Secondary | ICD-10-CM | POA: Diagnosis present

## 2014-05-23 DIAGNOSIS — N390 Urinary tract infection, site not specified: Secondary | ICD-10-CM

## 2014-05-23 DIAGNOSIS — IMO0002 Reserved for concepts with insufficient information to code with codable children: Secondary | ICD-10-CM | POA: Diagnosis present

## 2014-05-23 DIAGNOSIS — E662 Morbid (severe) obesity with alveolar hypoventilation: Secondary | ICD-10-CM

## 2014-05-23 DIAGNOSIS — L97519 Non-pressure chronic ulcer of other part of right foot with unspecified severity: Secondary | ICD-10-CM

## 2014-05-23 DIAGNOSIS — L97509 Non-pressure chronic ulcer of other part of unspecified foot with unspecified severity: Secondary | ICD-10-CM

## 2014-05-23 DIAGNOSIS — J9602 Acute respiratory failure with hypercapnia: Secondary | ICD-10-CM

## 2014-05-23 DIAGNOSIS — E119 Type 2 diabetes mellitus without complications: Secondary | ICD-10-CM | POA: Diagnosis present

## 2014-05-23 DIAGNOSIS — G4733 Obstructive sleep apnea (adult) (pediatric): Secondary | ICD-10-CM | POA: Diagnosis present

## 2014-05-23 DIAGNOSIS — I509 Heart failure, unspecified: Secondary | ICD-10-CM | POA: Diagnosis present

## 2014-05-23 DIAGNOSIS — K219 Gastro-esophageal reflux disease without esophagitis: Secondary | ICD-10-CM | POA: Diagnosis present

## 2014-05-23 DIAGNOSIS — I10 Essential (primary) hypertension: Secondary | ICD-10-CM

## 2014-05-23 DIAGNOSIS — L0390 Cellulitis, unspecified: Secondary | ICD-10-CM | POA: Diagnosis present

## 2014-05-23 DIAGNOSIS — L03115 Cellulitis of right lower limb: Secondary | ICD-10-CM

## 2014-05-23 DIAGNOSIS — I83893 Varicose veins of bilateral lower extremities with other complications: Secondary | ICD-10-CM

## 2014-05-23 DIAGNOSIS — Z86711 Personal history of pulmonary embolism: Secondary | ICD-10-CM

## 2014-05-23 DIAGNOSIS — R600 Localized edema: Secondary | ICD-10-CM | POA: Diagnosis present

## 2014-05-23 DIAGNOSIS — S8262XA Displaced fracture of lateral malleolus of left fibula, initial encounter for closed fracture: Secondary | ICD-10-CM

## 2014-05-23 LAB — COMPREHENSIVE METABOLIC PANEL
ALBUMIN: 2.9 g/dL — AB (ref 3.5–5.2)
ALT: 39 U/L — ABNORMAL HIGH (ref 0–35)
ANION GAP: 11 (ref 5–15)
AST: 45 U/L — ABNORMAL HIGH (ref 0–37)
Alkaline Phosphatase: 57 U/L (ref 39–117)
BILIRUBIN TOTAL: 0.5 mg/dL (ref 0.3–1.2)
BUN: 27 mg/dL — AB (ref 6–23)
CALCIUM: 9 mg/dL (ref 8.4–10.5)
CHLORIDE: 97 meq/L (ref 96–112)
CO2: 32 meq/L (ref 19–32)
CREATININE: 0.88 mg/dL (ref 0.50–1.10)
GFR calc Af Amer: 76 mL/min — ABNORMAL LOW (ref >90)
GFR, EST NON AFRICAN AMERICAN: 66 mL/min — AB (ref >90)
Glucose, Bld: 108 mg/dL — ABNORMAL HIGH (ref 70–99)
Potassium: 3.7 mEq/L (ref 3.7–5.3)
Sodium: 140 mEq/L (ref 137–147)
Total Protein: 7.9 g/dL (ref 6.0–8.3)

## 2014-05-23 LAB — CBC WITH DIFFERENTIAL/PLATELET
BASOS ABS: 0 10*3/uL (ref 0.0–0.1)
Basophils Relative: 0 % (ref 0–1)
Eosinophils Absolute: 0.1 10*3/uL (ref 0.0–0.7)
Eosinophils Relative: 1 % (ref 0–5)
HCT: 40.9 % (ref 36.0–46.0)
Hemoglobin: 13.8 g/dL (ref 12.0–15.0)
Lymphocytes Relative: 14 % (ref 12–46)
Lymphs Abs: 1.3 10*3/uL (ref 0.7–4.0)
MCH: 33.3 pg (ref 26.0–34.0)
MCHC: 33.7 g/dL (ref 30.0–36.0)
MCV: 98.6 fL (ref 78.0–100.0)
Monocytes Absolute: 1.1 10*3/uL — ABNORMAL HIGH (ref 0.1–1.0)
Monocytes Relative: 12 % (ref 3–12)
NEUTROS ABS: 6.6 10*3/uL (ref 1.7–7.7)
NEUTROS PCT: 73 % (ref 43–77)
Platelets: 182 10*3/uL (ref 150–400)
RBC: 4.15 MIL/uL (ref 3.87–5.11)
RDW: 16 % — AB (ref 11.5–15.5)
WBC: 9.1 10*3/uL (ref 4.0–10.5)

## 2014-05-23 LAB — GLUCOSE, CAPILLARY: Glucose-Capillary: 106 mg/dL — ABNORMAL HIGH (ref 70–99)

## 2014-05-23 LAB — PROTIME-INR
INR: 1.71 — ABNORMAL HIGH (ref 0.00–1.49)
Prothrombin Time: 20.1 seconds — ABNORMAL HIGH (ref 11.6–15.2)

## 2014-05-23 LAB — APTT: aPTT: 31 seconds (ref 24–37)

## 2014-05-23 MED ORDER — ACETAMINOPHEN 650 MG RE SUPP: 650.0000 mg | Freq: Four times a day (QID) | RECTAL | Status: DC | PRN

## 2014-05-23 MED ORDER — ATORVASTATIN CALCIUM 20 MG PO TABS
20.0000 mg | ORAL_TABLET | Freq: Every day | ORAL | Status: DC
  Administered 2014-05-23 – 2014-05-26 (×4): 20 mg via ORAL
  Filled 2014-05-23 (×4): qty 1

## 2014-05-23 MED ORDER — SILVER SULFADIAZINE 1 % EX CREA
TOPICAL_CREAM | Freq: Every day | CUTANEOUS | Status: DC
Start: 2014-05-23 — End: 2014-05-27
  Administered 2014-05-23 – 2014-05-26 (×3): via TOPICAL
  Filled 2014-05-23: qty 85

## 2014-05-23 MED ORDER — POLYETHYLENE GLYCOL 3350 17 G PO PACK: 17.0000 g | PACK | Freq: Every day | ORAL | Status: DC | PRN

## 2014-05-23 MED ORDER — GABAPENTIN 300 MG PO CAPS
300.0000 mg | ORAL_CAPSULE | Freq: Two times a day (BID) | ORAL | Status: DC
  Administered 2014-05-23 – 2014-05-26 (×7): 300 mg via ORAL
  Filled 2014-05-23 (×7): qty 1

## 2014-05-23 MED ORDER — MIRABEGRON ER 25 MG PO TB24
50.0000 mg | ORAL_TABLET | Freq: Every day | ORAL | Status: DC
  Administered 2014-05-23 – 2014-05-26 (×4): 50 mg via ORAL
  Filled 2014-05-23 (×2): qty 1
  Filled 2014-05-23: qty 2
  Filled 2014-05-23: qty 1
  Filled 2014-05-23: qty 2

## 2014-05-23 MED ORDER — POTASSIUM CHLORIDE CRYS ER 20 MEQ PO TBCR
20.0000 meq | EXTENDED_RELEASE_TABLET | Freq: Two times a day (BID) | ORAL | Status: DC
  Administered 2014-05-23 – 2014-05-26 (×7): 20 meq via ORAL
  Filled 2014-05-23 (×7): qty 1

## 2014-05-23 MED ORDER — DOXYCYCLINE HYCLATE 100 MG PO TABS: 100.0000 mg | ORAL_TABLET | Freq: Two times a day (BID) | ORAL | Status: AC

## 2014-05-23 MED ORDER — WARFARIN - PHARMACIST DOSING INPATIENT: Freq: Every day | Status: DC

## 2014-05-23 MED ORDER — INSULIN ASPART 100 UNIT/ML ~~LOC~~ SOLN
0.0000 [IU] | Freq: Three times a day (TID) | SUBCUTANEOUS | Status: DC
  Administered 2014-05-24 – 2014-05-26 (×5): 1 [IU] via SUBCUTANEOUS
  Administered 2014-05-26: 2 [IU] via SUBCUTANEOUS
  Administered 2014-05-26: 1 [IU] via SUBCUTANEOUS

## 2014-05-23 MED ORDER — SODIUM CHLORIDE 0.9 % IV BOLUS (SEPSIS)
1000.0000 mL | Freq: Once | INTRAVENOUS | Status: AC
  Administered 2014-05-23: 1000 mL via INTRAVENOUS

## 2014-05-23 MED ORDER — SENNA 8.6 MG PO TABS
1.0000 | ORAL_TABLET | Freq: Two times a day (BID) | ORAL | Status: DC
Start: 2014-05-23 — End: 2014-05-26
  Administered 2014-05-23 – 2014-05-26 (×6): 8.6 mg via ORAL
  Filled 2014-05-23 (×6): qty 1

## 2014-05-23 MED ORDER — SILVER SULFADIAZINE 1 % EX CREA
TOPICAL_CREAM | CUTANEOUS | Status: AC
  Filled 2014-05-23: qty 400

## 2014-05-23 MED ORDER — PANTOPRAZOLE SODIUM 40 MG PO TBEC
40.0000 mg | DELAYED_RELEASE_TABLET | Freq: Every day | ORAL | Status: DC
  Administered 2014-05-23 – 2014-05-26 (×4): 40 mg via ORAL
  Filled 2014-05-23 (×4): qty 1

## 2014-05-23 MED ORDER — WARFARIN SODIUM 2 MG PO TABS
3.0000 mg | ORAL_TABLET | Freq: Once | ORAL | Status: AC
  Administered 2014-05-23: 3 mg via ORAL
  Filled 2014-05-23 (×2): qty 1

## 2014-05-23 MED ORDER — VANCOMYCIN HCL IN DEXTROSE 1-5 GM/200ML-% IV SOLN
1000.0000 mg | INTRAVENOUS | Status: AC
  Administered 2014-05-23 (×2): 1000 mg via INTRAVENOUS
  Filled 2014-05-23 (×2): qty 200

## 2014-05-23 MED ORDER — PIPERACILLIN-TAZOBACTAM 3.375 G IVPB
INTRAVENOUS | Status: AC
  Filled 2014-05-23: qty 100

## 2014-05-23 MED ORDER — ACETAMINOPHEN 325 MG PO TABS: 650.0000 mg | ORAL_TABLET | Freq: Four times a day (QID) | ORAL | Status: DC | PRN

## 2014-05-23 MED ORDER — VANCOMYCIN HCL IN DEXTROSE 1-5 GM/200ML-% IV SOLN
INTRAVENOUS | Status: AC
  Filled 2014-05-23: qty 400

## 2014-05-23 MED ORDER — AMIODARONE HCL 200 MG PO TABS
400.0000 mg | ORAL_TABLET | Freq: Every day | ORAL | Status: DC
  Administered 2014-05-23 – 2014-05-26 (×4): 400 mg via ORAL
  Filled 2014-05-23 (×4): qty 2

## 2014-05-23 MED ORDER — PIPERACILLIN-TAZOBACTAM 3.375 G IVPB
3.3750 g | Freq: Three times a day (TID) | INTRAVENOUS | Status: DC
  Administered 2014-05-23 – 2014-05-26 (×8): 3.375 g via INTRAVENOUS
  Filled 2014-05-23 (×9): qty 50

## 2014-05-23 MED ORDER — VANCOMYCIN HCL 10 G IV SOLR
1500.0000 mg | Freq: Two times a day (BID) | INTRAVENOUS | Status: DC
  Administered 2014-05-24: 1500 mg via INTRAVENOUS
  Filled 2014-05-23: qty 1500

## 2014-05-23 MED ORDER — HYDROCODONE-ACETAMINOPHEN 5-325 MG PO TABS
1.0000 | ORAL_TABLET | ORAL | Status: DC | PRN
  Administered 2014-05-25: 1 via ORAL
  Filled 2014-05-23: qty 1

## 2014-05-23 MED ORDER — SODIUM CHLORIDE 0.9 % IV SOLN
INTRAVENOUS | Status: DC
  Administered 2014-05-23: 23:00:00 via INTRAVENOUS

## 2014-05-23 NOTE — Progress Notes (Signed)
Pt admitted to room 327 via wheelchair from the ED as a direct admit. Pt is alert and oriented x 4, able to follow commands and denies any pain at this time. Pt has boot cast to right foot. Standard hospital orientation completed, bed in lowest position, call bell within reach. MD to bedside and is aware of pt's VS. MD to enter new orders.

## 2014-05-23 NOTE — Progress Notes (Signed)
ANTICOAGULATION CONSULT NOTE - Initial Consult  Pharmacy Consult for Coumdin Indication: atrial fibrillation  Allergies  Allergen Reactions  . Gabapentin Other (See Comments)    Dizziness  . Lovenox [Enoxaparin Sodium] Itching    Itching, reddness    Patient Measurements: Height: 5\' 4"  (162.6 cm) Weight: 335 lb (151.955 kg) IBW/kg (Calculated) : 54.7 Heparin Dosing Weight:   Vital Signs: Temp: 98 F (36.7 C) (08/03 2004) Temp src: Oral (08/03 2004) BP: 94/71 mmHg (08/03 2004) Pulse Rate: 71 (08/03 2004)  Labs:  Recent Labs  06/03/2014 1935 06/05/2014 1956  HGB  --  13.8  HCT  --  40.9  PLT  --  182  APTT  --  31  LABPROT 20.1*  --   INR 1.71*  --   CREATININE  --  0.88    Estimated Creatinine Clearance: 89.2 ml/min (by C-G formula based on Cr of 0.88).   Medical History: Past Medical History  Diagnosis Date  . HTN (hypertension)   . Hyperlipemia   . GERD (gastroesophageal reflux disease)   . Obesity   . Sleep apnea     she has not been on CPAP  . PE (pulmonary embolism) 05/2011  . DOE (dyspnea on exertion) 01/22/2012  . Hematuria 01/22/2012  . Diabetes mellitus without complication   . A-fib   . Neurogenic bladder   . CHF (congestive heart failure)   . Ulcer     both feet    Medications:  Scheduled:  . amiodarone  400 mg Oral Daily  . atorvastatin  20 mg Oral q1800  . gabapentin  300 mg Oral BID  . [START ON 05/24/2014] insulin aspart  0-9 Units Subcutaneous TID WC  . mirabegron ER  50 mg Oral Daily  . pantoprazole  40 mg Oral Daily  . piperacillin-tazobactam (ZOSYN)  IV  3.375 g Intravenous Q8H  . potassium chloride SA  20 mEq Oral BID  . senna  1 tablet Oral BID  . silver sulfADIAZINE   Topical Daily  . [START ON 05/24/2014] vancomycin  1,500 mg Intravenous Q12H  . vancomycin  1,000 mg Intravenous Q1 Hr x 2  . warfarin  3 mg Oral Once  . [START ON 05/24/2014] Warfarin - Pharmacist Dosing Inpatient   Does not apply q1800    Assessment: Continuation  of Coumadin PTA INR subtherapeutic on admission   Goal of Therapy:  INR 2-3 Monitor platelets by anticoagulation protocol: Yes   Plan:  Coumadin 3 mg po x 1 dose tonight INR/PT daily Labs per protocol  Raquel JamesPittman, Iyani Dresner Bennett 06/19/2014,10:24 PM

## 2014-05-23 NOTE — Addendum Note (Signed)
Addended by: Bearl MulberryUTHERFORD, NATALIE K on: 06/06/2014 11:10 AM   Modules accepted: Orders

## 2014-05-23 NOTE — H&P (Signed)
[facility] Admission History and Physical   Patient name: Kathleen [name] Medical record number: [id number] Date of birth: Feb 12, 1946 Age: 68 y.o. Gender: female  Primary Care Provider: Rudi Heap, MD Consultants: none Code Status: Full  Chief Complaint: R ankle pain  Assessment and Plan: MIXTLI RENO is a 68 y.o. female presenting with cellulitis. PMH is significant for Afib, DM, Neurogenic bladder, OSA, HTN, GERD, hypothyroidism?  Cellulitis/Sepsis: R leg cellulitis likely secondary to undelrying PVD and venous stasis dermatitis and additional edema from recent fracture and skin tears. Pt meets SIRS criteria and likely sepsis though labs pending due to hypotension, respiratory rate, edema. Appears stable overall. No outpt ABX up to this point. Infection does not appear limb/digit threatening at this point.  - Admit to 300 - Tele - CBC w/ diff, BCX, UA, UCX, CMET.  - Vanc/Zosyn (Vanc to cover MRSA due to rapidly progressing illness and Zosyn for pseudomonas due to DM) - IVF 1L NS bolus - NS 149ml/hr - LE stockings  - Nursing wound care for R 3rd toe and L lateral leg wounds. Silvadene and wraps - elevate LE  Ankle Fracture: R distal fib and possible R 5th metatarsal. Cam walker boot at time of presentation  - Up w/ assistance - Cam walker boot for ambulation  Afib: rate controlled on admission. Last Echo EF 60-65% adn no diastolic dysfunction - continue home amiodarone - coumadin  DM: Pre DM per last A1c of 5.9 on 7/24.  - BMET in am - SSI PRN  Obstructive sleep apnea. CPAP dependent. Most assuredly w/ obesity hypoventilation - CPAP at night  HTN: hypotensive on admission. See above - hold torsemide  FEN/GI:  - heart healthy diet.  - Protonix  Prophylaxis: none  Disposition: pending improvement  History of Present Illness: Kathleen [name] is a 68 y.o. female presenting with fell on 7/26th. Developed pain and swelling since that time. Pain and  swelling started to improve but then worsened again. Skin pulled back during the initial injury around the toe. Becoming more red adn swollen. Seen in PCP office today and told that she needed to come in to the hospital due to cellulitis.    Review Of Systems: Per HPI with the following additions: none  Otherwise 12 point review of systems was performed and was unremarkable.  Patient Active Problem List   Diagnosis Date Noted  . Venous stasis dermatitis of both lower extremities 06-02-14  . Cellulitis of leg, right 2014-06-02  . Closed high lateral malleolus fracture 06/02/2014  . Varicose veins of lower extremities with other complications 09/06/2013  . Chronic anticoagulation 08/30/2013  . Nonhealing skin ulcer 08/24/2013  . Venous ulcer of leg 08/24/2013  . Pulmonary embolism 04/12/2013  . Septic shock(785.52) 11/13/2012  . AKI (acute kidney injury) 11/13/2012  . HCAP (healthcare-associated pneumonia) 11/13/2012  . Acute respiratory failure with hypercapnia 11/03/2012  . Acute encephalopathy 11/03/2012  . Acute-on-chronic respiratory failure 11/03/2012  . Morbid obesity 11/03/2012  . Hypotension 11/03/2012  . OSA (obstructive sleep apnea) 11/03/2012  . Obesity hypoventilation syndrome 11/03/2012  . Cellulitis 11/02/2012  . DOE (dyspnea on exertion) 01/22/2012  . Insomnia 01/22/2012  . Hematuria 01/22/2012  . HTN (hypertension)   . Hyperlipemia   . GERD (gastroesophageal reflux disease)   . Obesity   . Personal history of pulmonary embolism 05/22/2011   Past Medical History: Past Medical History  Diagnosis Date  . HTN (hypertension)   . Hyperlipemia   . GERD (gastroesophageal reflux  disease)   . Obesity   . Sleep apnea     she has not been on CPAP  . PE (pulmonary embolism) 05/2011  . DOE (dyspnea on exertion) 01/22/2012  . Hematuria 01/22/2012  . Diabetes mellitus without complication   . A-fib   . Neurogenic bladder   . CHF (congestive heart failure)   . Ulcer      both feet   Past Surgical History: Past Surgical History  Procedure Laterality Date  . Total abdominal hysterectomy w/ bilateral salpingoophorectomy    . Tonsillectomy    . Arm surgery Right     Broken and had rods inserted which later were removed.   Social History: History  Substance Use Topics  . Smoking status: Never Smoker   . Smokeless tobacco: Never Used  . Alcohol Use: No   Additional social history: none   Please also refer to relevant sections of EMR.  Family History: Family History  Problem Relation Age of Onset  . Adopted: Yes   Allergies and Medications: Allergies  Allergen Reactions  . Gabapentin Other (See Comments)    Dizziness  . Lovenox [Enoxaparin Sodium]     Itching, reddness   No current facility-administered medications on file prior to encounter.   Current Outpatient Prescriptions on File Prior to Encounter  Medication Sig Dispense Refill  . Ascorbic Acid (VITAMIN C) 1000 MG tablet Take 1,000 mg by mouth 2 (two) times daily.      Marland Kitchen atorvastatin (LIPITOR) 20 MG tablet Take 1 tablet (20 mg total) by mouth daily.  30 tablet  4  . Cholecalciferol (VITAMIN D3) 50000 UNITS CAPS Take 1 capsule by mouth once a week.  8 capsule  0  . clotrimazole (LOTRIMIN) 1 % cream Apply 1 application topically 2 (two) times daily.      Marland Kitchen docusate sodium (COLACE) 100 MG capsule Take 1 capsule (100 mg total) by mouth 2 (two) times daily.  60 capsule  3  . doxycycline (VIBRA-TABS) 100 MG tablet Take 1 tablet (100 mg total) by mouth 2 (two) times daily.  20 tablet  0  . gabapentin (NEURONTIN) 300 MG capsule       . ipratropium-albuterol (DUONEB) 0.5-2.5 (3) MG/3ML SOLN Take 3 mLs by nebulization 3 (three) times daily.  360 mL  4  . methylPREDNIsolone (MEDROL DOSPACK) 4 MG tablet Take by mouth as directed.       Marland Kitchen omeprazole (PRILOSEC) 20 MG capsule Take 1 capsule (20 mg total) by mouth daily.  30 capsule  5  . PATADAY 0.2 % SOLN       . torsemide (DEMADEX) 20 MG tablet  Take 30 mg by mouth daily.       . Vitamin D, Ergocalciferol, (DRISDOL) 50000 UNITS CAPS capsule Take 1 capsule (50,000 Units total) by mouth every 7 (seven) days.  18 capsule  0    Objective: BP 98/44  Pulse 75  Temp(Src) 97.9 F (36.6 C) (Oral)  Resp 20  Ht 5\' 4"  (1.626 m)  Wt 151.955 kg (335 lb)  BMI 57.47 kg/m2  SpO2 98% Exam: General: NAD, Obese,  HEENT: mmm, EOMI, PERRL Cardiovascular: RRR, III/VI pansystolic murmur Respiratory: CTAB, nml effort  Abdomen: Obese, NABS, nonttp Extremities:  Diffuse bilat LE edema, <2sec cap refill in digits.  Skin: R leg erythema and induration from ankle to mid calf. R foot w/ intermittent ecchymosis and erythema and indruation. R 3rd toe w/ shearing type injury to the dorsum of the toe w/o purulence. L  lateral mid leg w/ 2x0.5cm puncture type wound w/ serosanguinous drainage. Neuro: CN 2-12 grossly intact  Labs and Imaging: No results found for this or any previous visit (from the past 24 hour(s)).  Dg Ankle Complete Right  06/08/2014   CLINICAL DATA:  Right ankle pain and swelling after falling 1 week ago.  EXAM: RIGHT ANKLE - COMPLETE 3+ VIEW  COMPARISON:  None.  FINDINGS: There is an oblique nondisplaced fracture of the distal fibula extending proximally from the ankle mortise. There is no widening of the ankle mortise. The distal tibia appears intact. The tarsal bones appear intact. There is deformity of the fifth metatarsal neck which may not be acute. Subcutaneous edema/ swelling is noted throughout the lower leg. There are scattered vascular calcifications, midfoot degenerative changes and calcaneal spurring.  IMPRESSION: Nondisplaced acute/ subacute oblique fracture of the distal fibula. Age indeterminate fracture of the fifth metatarsal neck.  These results will be called to the ordering clinician or representative by the Radiologist Assistant, and communication documented in the PACS or zVision Dashboard.   Electronically Signed   By: Roxy HorsemanBill   Veazey M.D.   On: 11/14/2013 11:20     Ozella Rocksavid J Cas Tracz, MD Family Medicine 06/19/2014, 6:56 PM [facility]

## 2014-05-23 NOTE — Patient Instructions (Addendum)
Return to Dr Purcell Nailsxford today for possible hospital admit Wear boot as directed

## 2014-05-23 NOTE — ED Notes (Signed)
Patient is not supposed to be an ER patient, she is a direct admit.

## 2014-05-23 NOTE — Progress Notes (Signed)
Patient ID: Kathleen [name], female   DOB: [birth year], 68 y.o.   MRN: [id number]  Chief Complaint  Patient presents with  . Ankle Injury    right ankle fracture, DOI 05/15/14     68 year old female with chronic venous stasis disease history of ulcers chronic obesity and multiple medical problems presents as a referral from Dr. Purcell Nailsxford in [place] in [place] and. She complains of seven-day history of pain in her right ankle after fall. She sustained a right ankle fracture. Date of injury was July 26. Initial visit to the doctor's office was today he wanted her seen today for a nondisplaced right fibular fracture. She complains of pain swelling aching. Pain rated 5/10. She's wearing a postop shoe bilaterally which she wears for chronic foot swelling. We asked about her feet 10 she was advised to get shoes she says she's already been told that this hasn't been able to do it yet  The past, family history and social history have been reviewed and are recorded in the corresponding sections of epic  Review of systems has been recorded reviewed and signed and scanned into the chart  BP 121/60  Ht 5\' 4"  (1.626 m)  Wt 335 lb (151.955 kg)  BMI 57.47 kg/m2 Very pleasant lady well-developed poor nutrition as evidenced by morbid obesity. She is oriented x3. Mood is normal. She ambulates with a limp she wobbles her stride lengths are decreased in size a poor gait pattern  Her upper extremities look okay. No contracture. No instability subluxation. Normal muscle tone and scanned.  Left lower extremity shows skin changes of venous stasis disease with firm dry skin minimal redness and edema no muscle atrophy ankle joint and knee joint are stable with flexion of the knee 110 otherwise if the leg looks okay other than his venous changes.  The right leg is red and blanches appears to be warm and skin changes of chronic stasis disease with acute cellulitis probably. Muscle tone is normal ankle is stable  tenderness over the fibula is noted ecchymosis is noted from the fibular fracture is edema deep tendon reflexes were 0 lymph nodes were negative sensation was normal pulses were barely palpable  And I spoke with her doctor I advised that she be admitted he thinks he can take care of as an outpatient  As far as the fibular fracture does place her in an extra-large Cam Walker weightbearing as tolerated. She will need wound care and antibiotics. Followup with me in 6 weeks for x-rays

## 2014-05-23 NOTE — Progress Notes (Signed)
MD ordered patient cpap, patient says she wears at home but said she don't want to wear tonight- she wanted to try tomorrow night-placed patient on [facility], RT will continue to monitor

## 2014-05-23 NOTE — Progress Notes (Signed)
   Subjective:    Patient ID: Kathleen [name], female    DOB: Apr 02, 1946, 68 y.o.   MRN: [id number]  HPI    Review of Systems     Objective:   Physical Exam    Xray right ankle - fracture distal fibula    Assessment & Plan:  Fractured right distal fibula - Consult ortho - Dr. Romeo AppleHarrison in [place].  Cellulitis and right foot wound - Agree with Dr. Romeo AppleHarrison patient will be admitted to Hospitalist services [facility]  Dr. Romeo AppleHarrison called and recommends patient be admitted to Hospitalist services [facility].  Deatra CanterWilliam J Korayma Hagwood FNP Hospitalist called and will admit patient.  Patient is advised to go to [facility].

## 2014-05-23 NOTE — Progress Notes (Signed)
   Subjective:    Patient ID: Kathleen [name], female    DOB: April 27, 1946, 68 y.o.   MRN: [id number]  HPI This 68 y.o. female presents for evaluation of right second toe injury due to fall.  She has a wound On his second toe that is draining.   Review of Systems C/o second toe wound No chest pain, SOB, HA, dizziness, vision change, N/V, diarrhea, constipation, dysuria, urinary urgency or frequency, myalgias, arthralgias or rash.     Objective:   Physical Exam  Vital signs noted  Well developed well nourished female.  HEENT - Head atraumatic Normocephalic Respiratory - Lungs CTA bilateral Cardiac - RRR S1 and S2 without murmur GI - Abdomen soft Nontender and bowel sounds active x 4 Skin - Wound right second toe with drainage - wound cx obtained      Assessment & Plan:  Right second toe ulcer, with unspecified severity - Plan: doxycycline (VIBRA-TABS) 100 MG tablet, DG Ankle Complete Right, DG Foot Complete Right, Wound culture  Deatra CanterWilliam J Gage Treiber FNP

## 2014-05-23 NOTE — Progress Notes (Signed)
ANTIBIOTIC CONSULT NOTE - INITIAL  Pharmacy Consult for Vancomycin & Zosyn Indication: Sepsis/cellulitis  Allergies  Allergen Reactions  . Gabapentin Other (See Comments)    Dizziness  . Lovenox [Enoxaparin Sodium]     Itching, reddness    Patient Measurements: Height: 5\' 4"  (162.6 cm) Weight: 335 lb (151.955 kg) IBW/kg (Calculated) : 54.7 Adjusted Body Weight:   Vital Signs: Temp: 97.9 F (36.6 C) (08/03 1806) Temp src: Oral (08/03 1806) BP: 98/44 mmHg (08/03 1806) Pulse Rate: 75 (08/03 1806) Intake/Output from previous day:   Intake/Output from this shift:    Labs: No results found for this basename: WBC, HGB, PLT, LABCREA, CREATININE,  in the last 72 hours Estimated Creatinine Clearance: 81.7 ml/min (by C-G formula based on Cr of 0.96). No results found for this basename: VANCOTROUGH, VANCOPEAK, VANCORANDOM, GENTTROUGH, GENTPEAK, GENTRANDOM, TOBRATROUGH, TOBRAPEAK, TOBRARND, AMIKACINPEAK, AMIKACINTROU, AMIKACIN,  in the last 72 hours   Microbiology: No results found for this or any previous visit (from the past 720 hour(s)).  Medical History: Past Medical History  Diagnosis Date  . HTN (hypertension)   . Hyperlipemia   . GERD (gastroesophageal reflux disease)   . Obesity   . Sleep apnea     she has not been on CPAP  . PE (pulmonary embolism) 05/2011  . DOE (dyspnea on exertion) 01/22/2012  . Hematuria 01/22/2012  . Diabetes mellitus without complication   . A-fib   . Neurogenic bladder   . CHF (congestive heart failure)   . Ulcer     both feet    Medications:  Scheduled:  . amiodarone  400 mg Oral Daily  . atorvastatin  20 mg Oral q1800  . gabapentin  300 mg Oral BID  . [START ON 05/24/2014] insulin aspart  0-9 Units Subcutaneous TID WC  . mirabegron ER  50 mg Oral Daily  . pantoprazole  40 mg Oral Daily  . piperacillin-tazobactam (ZOSYN)  IV  3.375 g Intravenous Q8H  . potassium chloride SA  20 mEq Oral BID  . senna  1 tablet Oral BID  . silver  sulfADIAZINE   Topical Daily  . sodium chloride  1,000 mL Intravenous Once  . [START ON 05/24/2014] vancomycin  1,500 mg Intravenous Q12H  . vancomycin  1,000 mg Intravenous Q1 Hr x 2   Assessment: Cellulitis/sepsis Right leg cellulitis likely secondary to underlying PVD, venous stasis dermatitis and additional edema from recent fracture/skin tears. Likely sepsis, labs pending. Obese patient, normalized CrCl 62 ml/min  Goal of Therapy:  Vancomycin trough level 15-20 mcg/ml  Plan:  Vancomycin 2 GM IV loading dose (infuse #2 Vancomycin 1 GM doses) Then Vancomycin 1500 mg IV every 12 hours Zosyn 3.375 GM IV every 8 hours, infused over 4 hours Vancomycin trough at steady state Labs per protocol  Raquel JamesPittman, Roarke Marciano Bennett 06/11/2014,7:46 PM

## 2014-05-23 NOTE — Addendum Note (Signed)
Addended by: Tommas OlpHANDY, ASHLEY N on: 06/13/2014 02:08 PM   Modules accepted: Orders

## 2014-05-24 ENCOUNTER — Telehealth: Payer: Self-pay | Admitting: Family Medicine

## 2014-05-24 ENCOUNTER — Other Ambulatory Visit: Payer: Self-pay | Admitting: Family Medicine

## 2014-05-24 DIAGNOSIS — E669 Obesity, unspecified: Secondary | ICD-10-CM

## 2014-05-24 DIAGNOSIS — N390 Urinary tract infection, site not specified: Secondary | ICD-10-CM | POA: Diagnosis present

## 2014-05-24 DIAGNOSIS — I10 Essential (primary) hypertension: Secondary | ICD-10-CM

## 2014-05-24 DIAGNOSIS — I8310 Varicose veins of unspecified lower extremity with inflammation: Secondary | ICD-10-CM

## 2014-05-24 DIAGNOSIS — IMO0002 Reserved for concepts with insufficient information to code with codable children: Secondary | ICD-10-CM | POA: Diagnosis present

## 2014-05-24 DIAGNOSIS — Z7901 Long term (current) use of anticoagulants: Secondary | ICD-10-CM

## 2014-05-24 DIAGNOSIS — A419 Sepsis, unspecified organism: Secondary | ICD-10-CM | POA: Diagnosis present

## 2014-05-24 DIAGNOSIS — L03119 Cellulitis of unspecified part of limb: Secondary | ICD-10-CM

## 2014-05-24 DIAGNOSIS — L02419 Cutaneous abscess of limb, unspecified: Secondary | ICD-10-CM

## 2014-05-24 LAB — URINE MICROSCOPIC-ADD ON

## 2014-05-24 LAB — URINALYSIS, ROUTINE W REFLEX MICROSCOPIC
BILIRUBIN URINE: NEGATIVE
Glucose, UA: NEGATIVE mg/dL
Ketones, ur: NEGATIVE mg/dL
Nitrite: NEGATIVE
PH: 5.5 (ref 5.0–8.0)
SPECIFIC GRAVITY, URINE: 1.02 (ref 1.005–1.030)
UROBILINOGEN UA: 0.2 mg/dL (ref 0.0–1.0)

## 2014-05-24 LAB — GLUCOSE, CAPILLARY
GLUCOSE-CAPILLARY: 129 mg/dL — AB (ref 70–99)
GLUCOSE-CAPILLARY: 111 mg/dL — AB (ref 70–99)
Glucose-Capillary: 130 mg/dL — ABNORMAL HIGH (ref 70–99)
Glucose-Capillary: 127 mg/dL — ABNORMAL HIGH (ref 70–99)

## 2014-05-24 LAB — COMPREHENSIVE METABOLIC PANEL
ALBUMIN: 2.5 g/dL — AB (ref 3.5–5.2)
ALT: 35 U/L (ref 0–35)
AST: 40 U/L — AB (ref 0–37)
Alkaline Phosphatase: 54 U/L (ref 39–117)
Anion gap: 10 (ref 5–15)
BILIRUBIN TOTAL: 0.4 mg/dL (ref 0.3–1.2)
BUN: 22 mg/dL (ref 6–23)
CO2: 31 mEq/L (ref 19–32)
CREATININE: 0.81 mg/dL (ref 0.50–1.10)
Calcium: 8.5 mg/dL (ref 8.4–10.5)
Chloride: 100 mEq/L (ref 96–112)
GFR calc Af Amer: 84 mL/min — ABNORMAL LOW (ref >90)
GFR calc non Af Amer: 72 mL/min — ABNORMAL LOW (ref >90)
Glucose, Bld: 126 mg/dL — ABNORMAL HIGH (ref 70–99)
Potassium: 4 mEq/L (ref 3.7–5.3)
SODIUM: 141 meq/L (ref 137–147)
Total Protein: 7.1 g/dL (ref 6.0–8.3)

## 2014-05-24 LAB — CBC
HEMATOCRIT: 38.6 % (ref 36.0–46.0)
Hemoglobin: 12.3 g/dL (ref 12.0–15.0)
MCH: 32 pg (ref 26.0–34.0)
MCHC: 31.9 g/dL (ref 30.0–36.0)
MCV: 100.5 fL — AB (ref 78.0–100.0)
Platelets: 172 10*3/uL (ref 150–400)
RBC: 3.84 MIL/uL — ABNORMAL LOW (ref 3.87–5.11)
RDW: 16.3 % — AB (ref 11.5–15.5)
WBC: 6.2 10*3/uL (ref 4.0–10.5)

## 2014-05-24 LAB — CULTURE, BLOOD (ROUTINE X 2)

## 2014-05-24 LAB — PROTIME-INR
INR: 1.9 — ABNORMAL HIGH (ref 0.8–1.2)
INR: 1.69 — ABNORMAL HIGH (ref 0.00–1.49)
PROTHROMBIN TIME: 20 s — AB (ref 9.1–12.0)
Prothrombin Time: 19.9 seconds — ABNORMAL HIGH (ref 11.6–15.2)

## 2014-05-24 MED ORDER — WARFARIN SODIUM 5 MG PO TABS
5.0000 mg | ORAL_TABLET | Freq: Once | ORAL | Status: AC
Start: 2014-05-24 — End: 2014-05-24
  Administered 2014-05-24: 5 mg via ORAL
  Filled 2014-05-24: qty 1

## 2014-05-24 MED ORDER — VANCOMYCIN HCL 10 G IV SOLR
2000.0000 mg | Freq: Two times a day (BID) | INTRAVENOUS | Status: DC
  Administered 2014-05-24 – 2014-05-26 (×4): 2000 mg via INTRAVENOUS
  Filled 2014-05-24 (×4): qty 2000

## 2014-05-24 MED ORDER — FUROSEMIDE 10 MG/ML IJ SOLN
20.0000 mg | Freq: Two times a day (BID) | INTRAMUSCULAR | Status: DC
  Administered 2014-05-24 – 2014-05-26 (×5): 20 mg via INTRAVENOUS
  Filled 2014-05-24 (×5): qty 2

## 2014-05-24 MED ORDER — WARFARIN - PHARMACIST DOSING INPATIENT
Status: DC
  Administered 2014-05-26: 1

## 2014-05-24 NOTE — Care Management Utilization Note (Signed)
UR completed 

## 2014-05-24 NOTE — Progress Notes (Signed)
Patient seen and examined. Note reviewed.  She's been admitted with cellulitis of her right lower extremity. She has chronic lower extremity edema with venous stasis. She's been started on appropriate antibiotics. We'll continue to keep her legs elevated. She does have significant edema, appears to be mildly short of breath as well as has some abdominal wall edema. Will discontinue IV fluids at this point and start her on low-dose intravenous Lasix. Recent echocardiogram shows preserved ejection fraction. I anticipate she should be in the hospital another few days.  MEMON,JEHANZEB

## 2014-05-24 NOTE — Progress Notes (Signed)
[facility] PROGRESS NOTE  CARMINE YOUNGBERG SVX:[id number] DOB: 03/19/1946 DOA: 05/25/2014 PCP: Redge Gainer, MD  Assessment/Plan: Sepsis: related to  R leg cellulitis likely secondary to undelrying PVD and venous stasis dermatitis and additional edema from recent fracture and skin tears. Pt met SIRS criteria and likely sepsis due to hypotension, respiratory rate, edema. Blood cultures no growth to date. Resolved this am. Continue  Vanc/Zosyn day #2. Will decrease IV rate.  Cellulitis: likely related to fall resulting in skin tear on right foot 7/29. Currently afebrile and non-toxic appearing. Continue vanc/zosyn day #2. No leukocytosis.    Ankle Fracture: R distal fib and possible R 5th metatarsal. Cam walker boot at time of presentation per ortho. Up w/ assistance. Weight bearing as tolerated per ortho. Physical therapy. Follow up with Dr Aline Brochure in 6 weeks  Right foot wound/skin tear/right toe wound. Silvadene wrap. Wound consult requested   Afib: rate controlled. Last Echo EF 23-30% adn no diastolic dysfunction. Continue home amiodarone. coumadin per pharmacy. INR 1.69 today  DM: Pre DM per last A1c of 5.9 on 7/24. CBG range 106-130. SSI PRN   Obstructive sleep apnea. CPAP    HTN: controlled. Continue to hold torsemide   Morbid obesity: 57.6. Nutritional consult.   Code Status: full Family Communication: none present Disposition Plan: home when ready   Consultants:  none  Procedures:  none  Antibiotics:  Vancomycin 06/07/2014>>>  Zosyn 05/25/2014>>  HPI/Subjective: sitting on side of bed. Reports right ankle "hurts a little".  Objective: Filed Vitals:   05/24/14 1030  BP: 118/59  Pulse:   Temp:   Resp:     Intake/Output Summary (Last 24 hours) at 05/24/14 1106 Last data filed at 05/24/14 0800  Gross per 24 hour  Intake      0 ml  Output    650 ml  Net   -650 ml   Filed Weights   05/25/2014 1806  Weight: 151.955 kg (335 lb)    Exam:   General:   Morbidly obese in NAD  Cardiovascular: RRR +murmur no gallup or rub. 1+ LE edema.   Respiratory: normal effort BS distant i hear no wheeze no rhonchi  Abdomen: obese soft +BS non-tender to palpation  Musculoskeletal: right ankle with mild edema and erythema likely related to cellulitis  Skin: right leg with receding erythema from mid shin to down to ankle. +induration. Dressing to R 3rd toe dry and intact   Data Reviewed: Basic Metabolic Panel:  Recent Labs Lab 06/09/2014 1956 05/24/14 0459  NA 140 141  K 3.7 4.0  CL 97 100  CO2 32 31  GLUCOSE 108* 126*  BUN 27* 22  CREATININE 0.88 0.81  CALCIUM 9.0 8.5   Liver Function Tests:  Recent Labs Lab 06/06/2014 1956 05/24/14 0459  AST 45* 40*  ALT 39* 35  ALKPHOS 57 54  BILITOT 0.5 0.4  PROT 7.9 7.1  ALBUMIN 2.9* 2.5*   No results found for this basename: LIPASE, AMYLASE,  in the last 168 hours No results found for this basename: AMMONIA,  in the last 168 hours CBC:  Recent Labs Lab 06/18/2014 1956 05/24/14 0459  WBC 9.1 6.2  NEUTROABS 6.6  --   HGB 13.8 12.3  HCT 40.9 38.6  MCV 98.6 100.5*  PLT 182 172   Cardiac Enzymes: No results found for this basename: CKTOTAL, CKMB, CKMBINDEX, TROPONINI,  in the last 168 hours BNP (last 3 results) No results found for this basename: PROBNP,  in the last 8760  hours CBG:  Recent Labs Lab 06/16/2014 2008 05/24/14 0757  GLUCAP 106* 130*    Recent Results (from the past 240 hour(s))  CULTURE, BLOOD (ROUTINE X 2)     Status: None   Collection Time    06/07/2014  7:56 PM      Result Value Ref Range Status   Specimen Description Blood   Final   Special Requests     Final   Value: NONE CANCELLED AND REOREDERED AS [id number] DUE TO AN ACCESSION NUMBER CONFLICT ON THE BACTALERT   Culture NO GROWTH 1 DAY   Final   Report Status PENDING   Incomplete  CULTURE, BLOOD (ROUTINE X 2)     Status: None   Collection Time    05/22/2014  7:56 PM      Result Value Ref Range Status   Specimen  Description Blood   Final   Special Requests     Final   Value: NONE CANCELLED AND REOREDERED AS [id number] DUE TO ACCESSION NUMBER CONFLICT ON BACTALERT   Culture NO GROWTH 1 DAY   Final   Report Status PENDING   Incomplete  CULTURE, BLOOD (ROUTINE X 2)     Status: None   Collection Time    06/17/2014  7:56 PM      Result Value Ref Range Status   Specimen Description BLOOD RIGHT ANTECUBITAL   Final   Special Requests BOTTLES DRAWN AEROBIC AND ANAEROBIC 8CC EACH   Final   Culture NO GROWTH 1 DAY   Final   Report Status PENDING   Incomplete  CULTURE, BLOOD (ROUTINE X 2)     Status: None   Collection Time    06/15/2014  7:56 PM      Result Value Ref Range Status   Specimen Description BLOOD RAC   Final   Special Requests     Final   Value: BOTTLES DRAWN AEROBIC AND ANAEROBIC AEB 6CC ANA [place]   Culture NO GROWTH 1 DAY   Final   Report Status PENDING   Incomplete     Studies: Dg Ankle Complete Right  06/18/2014   CLINICAL DATA:  Right ankle pain and swelling after falling 1 week ago.  EXAM: RIGHT ANKLE - COMPLETE 3+ VIEW  COMPARISON:  None.  FINDINGS: There is an oblique nondisplaced fracture of the distal fibula extending proximally from the ankle mortise. There is no widening of the ankle mortise. The distal tibia appears intact. The tarsal bones appear intact. There is deformity of the fifth metatarsal neck which may not be acute. Subcutaneous edema/ swelling is noted throughout the lower leg. There are scattered vascular calcifications, midfoot degenerative changes and calcaneal spurring.  IMPRESSION: Nondisplaced acute/ subacute oblique fracture of the distal fibula. Age indeterminate fracture of the fifth metatarsal neck.  These results will be called to the ordering clinician or representative by the Radiologist Assistant, and communication documented in the PACS or zVision Dashboard.   Electronically Signed   By: Camie Patience M.D.   On: 06/08/2014 11:20    Scheduled Meds: . amiodarone  400 mg  Oral Daily  . atorvastatin  20 mg Oral q1800  . gabapentin  300 mg Oral BID  . insulin aspart  0-9 Units Subcutaneous TID WC  . mirabegron ER  50 mg Oral Daily  . pantoprazole  40 mg Oral Daily  . piperacillin-tazobactam (ZOSYN)  IV  3.375 g Intravenous Q8H  . potassium chloride SA  20 mEq Oral BID  . senna  1  tablet Oral BID  . silver sulfADIAZINE   Topical Daily  . vancomycin  2,000 mg Intravenous Q12H  . warfarin  5 mg Oral Once  . Warfarin - Pharmacist Dosing Inpatient   Does not apply Q24H   Continuous Infusions: . sodium chloride 125 mL/hr at 05/25/2014 2308    Principal Problem:   Sepsis Active Problems:   Hyperlipemia   GERD (gastroesophageal reflux disease)   Morbid obesity   Obesity hypoventilation syndrome   Chronic anticoagulation   Venous stasis dermatitis of both lower extremities   Cellulitis of leg, right   Skin tear    Time spent: 35 minutes    [facility] Pager [phone]. If 7PM-7AM, please contact night-coverage at [email or web address], password [email or web address] 05/24/2014, 11:06 AM  LOS: 1 day

## 2014-05-24 NOTE — Progress Notes (Signed)
ANTICOAGULATION CONSULT NOTE - follow up  Pharmacy Consult for Coumadin Indication: atrial fibrillation  Allergies  Allergen Reactions  . Gabapentin Other (See Comments)    Dizziness  . Lovenox [Enoxaparin Sodium] Itching    Itching, reddness   Patient Measurements: Height: 5\' 4"  (162.6 cm) Weight: 335 lb (151.955 kg) IBW/kg (Calculated) : 54.7  Vital Signs: Temp: 98.4 F (36.9 C) (08/04 0505) Temp src: Oral (08/04 0505) BP: 118/59 mmHg (08/04 1030) Pulse Rate: 67 (08/04 0505)  Labs:  Recent Labs  06/03/2014 0942 05/31/2014 1935 06/20/2014 1956 05/24/14 0459  HGB  --   --  13.8 12.3  HCT  --   --  40.9 38.6  PLT  --   --  182 172  APTT  --   --  31  --   LABPROT 20.0* 20.1*  --  19.9*  INR 1.9* 1.71*  --  1.69*  CREATININE  --   --  0.88 0.81   Estimated Creatinine Clearance: 96.9 ml/min (by C-G formula based on Cr of 0.81).  Medical History: Past Medical History  Diagnosis Date  . HTN (hypertension)   . Hyperlipemia   . GERD (gastroesophageal reflux disease)   . Obesity   . Sleep apnea     she has not been on CPAP  . PE (pulmonary embolism) 05/2011  . DOE (dyspnea on exertion) 01/22/2012  . Hematuria 01/22/2012  . Diabetes mellitus without complication   . A-fib   . Neurogenic bladder   . CHF (congestive heart failure)   . Ulcer     both feet   Medications:  Scheduled:  . amiodarone  400 mg Oral Daily  . atorvastatin  20 mg Oral q1800  . gabapentin  300 mg Oral BID  . insulin aspart  0-9 Units Subcutaneous TID WC  . mirabegron ER  50 mg Oral Daily  . pantoprazole  40 mg Oral Daily  . piperacillin-tazobactam (ZOSYN)  IV  3.375 g Intravenous Q8H  . potassium chloride SA  20 mEq Oral BID  . senna  1 tablet Oral BID  . silver sulfADIAZINE   Topical Daily  . vancomycin  2,000 mg Intravenous Q12H  . warfarin  5 mg Oral Once  . Warfarin - Pharmacist Dosing Inpatient   Does not apply Q24H   Assessment: Continuation of Coumadin PTA INR subtherapeutic Home  dose reportedly Coumadin 3mg  daily  Goal of Therapy:  INR 2-3 Monitor platelets by anticoagulation protocol: Yes   Plan:  Coumadin 5mg  po today x 1 to boost INR INR/PT daily Labs per protocol  Valrie HartHall, Petina Muraski A 05/24/2014,10:57 AM

## 2014-05-24 NOTE — Progress Notes (Signed)
ANTIBIOTIC CONSULT NOTE - follow up  Pharmacy Consult for Vancomycin & Zosyn Indication: Sepsis/cellulitis  Allergies  Allergen Reactions  . Gabapentin Other (See Comments)    Dizziness  . Lovenox [Enoxaparin Sodium] Itching    Itching, reddness   Patient Measurements: Height: 5\' 4"  (162.6 cm) Weight: 335 lb (151.955 kg) IBW/kg (Calculated) : 54.7  Vital Signs: Temp: 98.4 F (36.9 C) (08/04 0505) Temp src: Oral (08/04 0505) BP: 118/59 mmHg (08/04 1030) Pulse Rate: 67 (08/04 0505) Intake/Output from previous day:   Intake/Output from this shift: Total I/O In: -  Out: 650 [Urine:650]  Labs:  Recent Labs  2014-06-20 1956 05/24/14 0459  WBC 9.1 6.2  HGB 13.8 12.3  PLT 182 172  CREATININE 0.88 0.81   Estimated Creatinine Clearance: 96.9 ml/min (by C-G formula based on Cr of 0.81). No results found for this basename: VANCOTROUGH, VANCOPEAK, VANCORANDOM, GENTTROUGH, GENTPEAK, GENTRANDOM, TOBRATROUGH, TOBRAPEAK, TOBRARND, AMIKACINPEAK, AMIKACINTROU, AMIKACIN,  in the last 72 hours   Microbiology: Recent Results (from the past 720 hour(s))  CULTURE, BLOOD (ROUTINE X 2)     Status: None   Collection Time    2014/06/20  7:56 PM      Result Value Ref Range Status   Specimen Description Blood   Final   Special Requests     Final   Value: NONE CANCELLED AND REOREDERED AS [id number] DUE TO AN ACCESSION NUMBER CONFLICT ON THE BACTALERT   Culture NO GROWTH 1 DAY   Final   Report Status PENDING   Incomplete  CULTURE, BLOOD (ROUTINE X 2)     Status: None   Collection Time    Jun 20, 2014  7:56 PM      Result Value Ref Range Status   Specimen Description Blood   Final   Special Requests     Final   Value: NONE CANCELLED AND REOREDERED AS [id number] DUE TO ACCESSION NUMBER CONFLICT ON BACTALERT   Culture NO GROWTH 1 DAY   Final   Report Status PENDING   Incomplete  CULTURE, BLOOD (ROUTINE X 2)     Status: None   Collection Time    06-20-2014  7:56 PM      Result Value Ref Range Status   Specimen Description BLOOD RIGHT ANTECUBITAL   Final   Special Requests BOTTLES DRAWN AEROBIC AND ANAEROBIC 8CC EACH   Final   Culture NO GROWTH 1 DAY   Final   Report Status PENDING   Incomplete  CULTURE, BLOOD (ROUTINE X 2)     Status: None   Collection Time    06/20/14  7:56 PM      Result Value Ref Range Status   Specimen Description BLOOD RAC   Final   Special Requests     Final   Value: BOTTLES DRAWN AEROBIC AND ANAEROBIC AEB 6CC ANA 8CC   Culture NO GROWTH 1 DAY   Final   Report Status PENDING   Incomplete   Medical History: Past Medical History  Diagnosis Date  . HTN (hypertension)   . Hyperlipemia   . GERD (gastroesophageal reflux disease)   . Obesity   . Sleep apnea     she has not been on CPAP  . PE (pulmonary embolism) 05/2011  . DOE (dyspnea on exertion) 01/22/2012  . Hematuria 01/22/2012  . Diabetes mellitus without complication   . A-fib   . Neurogenic bladder   . CHF (congestive heart failure)   . Ulcer     both feet   Medications:  Scheduled:  . amiodarone  400 mg Oral Daily  . atorvastatin  20 mg Oral q1800  . gabapentin  300 mg Oral BID  . insulin aspart  0-9 Units Subcutaneous TID WC  . mirabegron ER  50 mg Oral Daily  . pantoprazole  40 mg Oral Daily  . piperacillin-tazobactam (ZOSYN)  IV  3.375 g Intravenous Q8H  . potassium chloride SA  20 mEq Oral BID  . senna  1 tablet Oral BID  . silver sulfADIAZINE   Topical Daily  . vancomycin  2,000 mg Intravenous Q12H  . warfarin  5 mg Oral Once  . Warfarin - Pharmacist Dosing Inpatient   Does not apply Q24H   Assessment: Cellulitis/sepsis Right leg cellulitis likely secondary to underlying PVD, venous stasis dermatitis and additional edema from recent fracture/skin tears. Likely sepsis, labs pending. Morbidly obese patient, normalized CrCl 60-65 ml/min  Goal of Therapy:  Vancomycin trough level 15-20 mcg/ml  Plan:  Vancomycin 2000mg  IV every 12 hours Zosyn 3.375 GM IV every 8 hours, infused over 4  hours Vancomycin trough at steady state Labs per protocol  Valrie HartHall, Tashena Ibach A 05/24/2014,11:01 AM

## 2014-05-24 NOTE — Progress Notes (Signed)
ANTICOAGULATION CONSULT NOTE - follow up  Pharmacy Consult for Coumdin Indication: atrial fibrillation  Allergies  Allergen Reactions  . Gabapentin Other (See Comments)    Dizziness  . Lovenox [Enoxaparin Sodium] Itching    Itching, reddness   Patient Measurements: Height: 5\' 4"  (162.6 cm) Weight: 335 lb (151.955 kg) IBW/kg (Calculated) : 54.7  Vital Signs: Temp: 98.4 F (36.9 C) (08/04 0505) Temp src: Oral (08/04 0505) BP: 116/55 mmHg (08/04 0505) Pulse Rate: 67 (08/04 0505)  Labs:  Recent Labs  05/26/2014 0942 06/12/2014 1935 06/10/2014 1956 05/24/14 0459  HGB  --   --  13.8 12.3  HCT  --   --  40.9 38.6  PLT  --   --  182 172  APTT  --   --  31  --   LABPROT 20.0* 20.1*  --  19.9*  INR 1.9* 1.71*  --  1.69*  CREATININE  --   --  0.88 0.81   Estimated Creatinine Clearance: 96.9 ml/min (by C-G formula based on Cr of 0.81).  Medical History: Past Medical History  Diagnosis Date  . HTN (hypertension)   . Hyperlipemia   . GERD (gastroesophageal reflux disease)   . Obesity   . Sleep apnea     she has not been on CPAP  . PE (pulmonary embolism) 05/2011  . DOE (dyspnea on exertion) 01/22/2012  . Hematuria 01/22/2012  . Diabetes mellitus without complication   . A-fib   . Neurogenic bladder   . CHF (congestive heart failure)   . Ulcer     both feet   Medications:  Scheduled:  . amiodarone  400 mg Oral Daily  . atorvastatin  20 mg Oral q1800  . gabapentin  300 mg Oral BID  . insulin aspart  0-9 Units Subcutaneous TID WC  . mirabegron ER  50 mg Oral Daily  . pantoprazole  40 mg Oral Daily  . piperacillin-tazobactam (ZOSYN)  IV  3.375 g Intravenous Q8H  . potassium chloride SA  20 mEq Oral BID  . senna  1 tablet Oral BID  . silver sulfADIAZINE   Topical Daily  . vancomycin  1,500 mg Intravenous Q12H  . warfarin  5 mg Oral Once  . Warfarin - Pharmacist Dosing Inpatient   Does not apply Q24H   Assessment: Continuation of Coumadin PTA INR subtherapeutic on  admission Home dose reportedly Coumadin 3mg  daily.  Goal of Therapy:  INR 2-3 Monitor platelets by anticoagulation protocol: Yes   Plan:  Coumadin 5mg  today to boost INR INR/PT daily Labs per protocol  Valrie HartHall, Anitta Tenny A 05/24/2014,8:32 AM

## 2014-05-24 NOTE — Care Management Note (Signed)
    Page 1 of 1   05/26/2014     3:11:27 PM CARE MANAGEMENT NOTE 05/26/2014  Patient:  Kathleen [name],Kathleen [name]   Account Number:  [id number]  Date Initiated:  05/24/2014  Documentation initiated by:  Anibal HendersonBOLDEN,GENEVA  Subjective/Objective Assessment:   Admitted with cellulitis, Fx ankle, possible sepsis. Pt is from home. She has a CAP aide 4h/d 5/d a week [facility]. She has no children, and no family who help, but her church family assist as  needed. Her pastor is her POA     Action/Plan:   POA- is Larry SierrasJohn Atkins, [phone]- She refuses SNF and POA agrees that church family will assist and even spend the night if needed- [facility] and would like [facility]   Anticipated DC Date:  05/26/2014   Anticipated DC Plan:  HOME W HOME HEALTH SERVICES      DC Planning Services  CM consult      [facility]  [facility]   Choice offered to / List presented to:  C-1 Patient           Status of service:  In process, will continue to follow Medicare Important Message given?   (If response is "NO", the following Medicare IM given date fields will be blank) Date Medicare IM given:   Medicare IM given by:   Date Additional Medicare IM given:   Additional Medicare IM given by:    Discharge Disposition:    Per UR Regulation:  Reviewed for med. necessity/level of care/duration of stay  If discussed at Long Length of Stay Meetings, dates discussed:    Comments:  05/26/14 1510 Arlyss Queenammy [place] Sloop, RN BSN CM PT recommends SNF. Pt is agreeable. CSW aware and will start bed search.  05/24/14 1100 Anibal HendersonGeneva Bolden RN/CM

## 2014-05-24 NOTE — Consult Note (Signed)
WOC wound consult note Reason for Consult: Cellulitis right lower extremity, including right third toe and left lateral leg.  Edema right lower leg.  Wound type:Infectious Dressing procedure/placement/frequency: Called and spoke with bedside nurse.  Patient has current MD orders for Silvadene to right toe and lateral leg.  Also, to elevate lower extremities.  Will continue these orders for now and re-consult if needed.  Will not follow at this time.  Please re-consult if needed.  Maple HudsonKaren Nimrat Woolworth RN BSN CWON Pager [phone]

## 2014-05-25 ENCOUNTER — Ambulatory Visit (INDEPENDENT_AMBULATORY_CARE_PROVIDER_SITE_OTHER): Payer: Medicare Other | Admitting: Pharmacist

## 2014-05-25 DIAGNOSIS — E662 Morbid (severe) obesity with alveolar hypoventilation: Secondary | ICD-10-CM

## 2014-05-25 LAB — URINE CULTURE
Colony Count: NO GROWTH
Culture: NO GROWTH

## 2014-05-25 LAB — CBC
HCT: 39 % (ref 36.0–46.0)
Hemoglobin: 12.6 g/dL (ref 12.0–15.0)
MCH: 32.7 pg (ref 26.0–34.0)
MCHC: 32.3 g/dL (ref 30.0–36.0)
MCV: 101.3 fL — ABNORMAL HIGH (ref 78.0–100.0)
PLATELETS: 155 10*3/uL (ref 150–400)
RBC: 3.85 MIL/uL — ABNORMAL LOW (ref 3.87–5.11)
RDW: 16.2 % — AB (ref 11.5–15.5)
WBC: 6.9 10*3/uL (ref 4.0–10.5)

## 2014-05-25 LAB — AEROBIC CULTURE

## 2014-05-25 LAB — BASIC METABOLIC PANEL
Anion gap: 8 (ref 5–15)
BUN: 17 mg/dL (ref 6–23)
CALCIUM: 8.3 mg/dL — AB (ref 8.4–10.5)
CO2: 30 meq/L (ref 19–32)
CREATININE: 0.91 mg/dL (ref 0.50–1.10)
Chloride: 101 mEq/L (ref 96–112)
GFR calc Af Amer: 73 mL/min — ABNORMAL LOW (ref >90)
GFR, EST NON AFRICAN AMERICAN: 63 mL/min — AB (ref >90)
Glucose, Bld: 137 mg/dL — ABNORMAL HIGH (ref 70–99)
Potassium: 4.2 mEq/L (ref 3.7–5.3)
SODIUM: 139 meq/L (ref 137–147)

## 2014-05-25 LAB — GLUCOSE, CAPILLARY
GLUCOSE-CAPILLARY: 115 mg/dL — AB (ref 70–99)
GLUCOSE-CAPILLARY: 125 mg/dL — AB (ref 70–99)
GLUCOSE-CAPILLARY: 156 mg/dL — AB (ref 70–99)
Glucose-Capillary: 119 mg/dL — ABNORMAL HIGH (ref 70–99)

## 2014-05-25 LAB — PROTIME-INR
INR: 2 — ABNORMAL HIGH (ref 0.00–1.49)
Prothrombin Time: 22.7 seconds — ABNORMAL HIGH (ref 11.6–15.2)

## 2014-05-25 MED ORDER — LORAZEPAM 1 MG PO TABS
1.0000 mg | ORAL_TABLET | Freq: Once | ORAL | Status: AC
  Administered 2014-05-25: 1 mg via ORAL
  Filled 2014-05-25: qty 1

## 2014-05-25 MED ORDER — WARFARIN SODIUM 2 MG PO TABS
4.0000 mg | ORAL_TABLET | Freq: Once | ORAL | Status: AC
  Administered 2014-05-25: 4 mg via ORAL
  Filled 2014-05-25: qty 2

## 2014-05-25 MED ORDER — LORAZEPAM 1 MG PO TABS
2.0000 mg | ORAL_TABLET | Freq: Once | ORAL | Status: AC
  Administered 2014-05-25: 2 mg via ORAL
  Filled 2014-05-25: qty 2

## 2014-05-25 NOTE — Telephone Encounter (Signed)
Patient in hospital - called her cell number.   They are treating infection in toes.

## 2014-05-25 NOTE — Progress Notes (Signed)
ANTICOAGULATION CONSULT NOTE - follow up  Pharmacy Consult for Coumadin Indication: atrial fibrillation  Allergies  Allergen Reactions  . Gabapentin Other (See Comments)    Dizziness  . Lovenox [Enoxaparin Sodium] Itching    Itching, reddness   Patient Measurements: Height: 5\' 4"  (162.6 cm) Weight: 335 lb (151.955 kg) IBW/kg (Calculated) : 54.7  Vital Signs: Temp: 97.9 F (36.6 C) (08/04 2257) Temp src: Oral (08/04 2257) BP: 124/54 mmHg (08/05 0300) Pulse Rate: 72 (08/05 0300)  Labs:  Recent Labs  31-Jul-2014 1935  31-Jul-2014 1956 05/24/14 0459 05/25/14 0650  HGB  --   < > 13.8 12.3 12.6  HCT  --   --  40.9 38.6 39.0  PLT  --   --  182 172 155  APTT  --   --  31  --   --   LABPROT 20.1*  --   --  19.9* 22.7*  INR 1.71*  --   --  1.69* 2.00*  CREATININE  --   --  0.88 0.81 0.91  < > = values in this interval not displayed. Estimated Creatinine Clearance: 86.2 ml/min (by C-G formula based on Cr of 0.91).  Medical History: Past Medical History  Diagnosis Date  . HTN (hypertension)   . Hyperlipemia   . GERD (gastroesophageal reflux disease)   . Obesity   . Sleep apnea     she has not been on CPAP  . PE (pulmonary embolism) 05/2011  . DOE (dyspnea on exertion) 01/22/2012  . Hematuria 01/22/2012  . Diabetes mellitus without complication   . A-fib   . Neurogenic bladder   . CHF (congestive heart failure)   . Ulcer     both feet   Medications:  Scheduled:  . amiodarone  400 mg Oral Daily  . atorvastatin  20 mg Oral q1800  . furosemide  20 mg Intravenous BID  . gabapentin  300 mg Oral BID  . insulin aspart  0-9 Units Subcutaneous TID WC  . mirabegron ER  50 mg Oral Daily  . pantoprazole  40 mg Oral Daily  . piperacillin-tazobactam (ZOSYN)  IV  3.375 g Intravenous Q8H  . potassium chloride SA  20 mEq Oral BID  . senna  1 tablet Oral BID  . silver sulfADIAZINE   Topical Daily  . vancomycin  2,000 mg Intravenous Q12H  . Warfarin - Pharmacist Dosing Inpatient    Does not apply Q24H   Assessment: Continuation of Coumadin PTA INR is now therapeutic.  Home dose reportedly Coumadin 3mg  daily (dose may need adjustment)  Goal of Therapy:  INR 2-3 Monitor platelets by anticoagulation protocol: Yes   Plan:  Coumadin 4mg  po today x 1  INR/PT daily Labs per protocol  Valrie HartHall, Witney Huie A 05/25/2014,8:43 AM

## 2014-05-25 NOTE — Progress Notes (Signed)
Pt seen and examined. Agree with Ms. Vedia CofferBlack. Agree with assessment and plan. Briefly, pt presents with sepsis with LE cellulitis on abx. Pt also with ankle fx, to be followed up as outpatient. Pt with afib on anticoagulation. Otherwise, pt with BM of over 57 with nutrition consulted. Will follow

## 2014-05-25 NOTE — Progress Notes (Signed)
[facility] PROGRESS NOTE  Kathleen [name] BWB:[id number] DOB: 1946/06/02 DOA: 05/22/2014 PCP: Rudi Heap, MD  Assessment/Plan: Sepsis: related to R leg cellulitis likely secondary to undelrying PVD and venous stasis dermatitis and additional edema from recent fracture and skin tears. Pt met SIRS criteria and likely sepsis due to hypotension, respiratory rate, edema. Blood cultures no growth to date. Resolved this am. Continue Vanc/Zosyn day #3.    Cellulitis: likely related to fall resulting in skin tear on right foot 7/29. Not much improvement today. Continues with edema. Lasix IV started yesterday. Remains afebrile and non-toxic appearing. Continue vanc/zosyn day #3. No leukocytosis.   Ankle Fracture: R distal fib and possible R 5th metatarsal. Cam walker boot at time of presentation per ortho. Up w/ assistance. Weight bearing as tolerated with boot per ortho. Physical therapy. Follow up with Dr Romeo Apple in 6 weeks   Right foot wound/skin tear/right toe wound. Silvadene wrap. Appreciate wound consult   Afib: rate controlled. Last Echo EF 60-65% adn no diastolic dysfunction. Continue home amiodarone. coumadin per pharmacy. 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 CBG range 115-125. SSI PRN   Obstructive sleep apnea. CPAP : refused last night  HTN: controlled. Continue to hold torsemide   Morbid obesity: 57.6. Nutritional consult.     Code Status: full Family Communication: pastor who is POA and friend Disposition Plan: home when ready   Consultants:  none  Procedures:  none  Antibiotics: Vancomycin 06/19/2014>>>  Zosyn 06/04/2014>>   HPI/Subjective: Sitting up in bed. Reports pain in right ankle  Objective: Filed Vitals:   05/25/14 0300  BP: 124/54  Pulse: 72  Temp:   Resp: 20    Intake/Output Summary (Last 24 hours) at 05/25/14 1249 Last data filed at 05/25/14 0430  Gross per 24 hour  Intake    240 ml  Output   1600 ml  Net  -1360 ml    Filed Weights   06/16/2014 1806  Weight: 151.955 kg (335 lb)    Exam:   General:  Morbidly obese   Cardiovascular: RRR +murmur 1+LE edema  Respiratory: mild increased work of breathing faint crackles right base. No wheeze  Abdomen: obese soft +BS non-tender abdominal wall edema  Musculoskeletal: right ankle with swelling and erythema. Tender to touch. Dressing to right foot dry and intact. Dorsal right foot with swelling/erythema   Data Reviewed: Basic Metabolic Panel:  Recent Labs Lab 06/18/2014 1956 05/24/14 0459 05/25/14 0650  NA 140 141 139  K 3.7 4.0 4.2  CL 97 100 101  CO2 32 31 30  GLUCOSE 108* 126* 137*  BUN 27* 22 17  CREATININE 0.88 0.81 0.91  CALCIUM 9.0 8.5 8.3*   Liver Function Tests:  Recent Labs Lab 06/10/2014 1956 05/24/14 0459  AST 45* 40*  ALT 39* 35  ALKPHOS 57 54  BILITOT 0.5 0.4  PROT 7.9 7.1  ALBUMIN 2.9* 2.5*   No results found for this basename: LIPASE, AMYLASE,  in the last 168 hours No results found for this basename: AMMONIA,  in the last 168 hours CBC:  Recent Labs Lab 05/22/2014 1956 05/24/14 0459 05/25/14 0650  WBC 9.1 6.2 6.9  NEUTROABS 6.6  --   --   HGB 13.8 12.3 12.6  HCT 40.9 38.6 39.0  MCV 98.6 100.5* 101.3*  PLT 182 172 155   Cardiac Enzymes: No results found for this basename: CKTOTAL, CKMB, CKMBINDEX, TROPONINI,  in the last 168 hours BNP (last  3 results) No results found for this basename: PROBNP,  in the last 8760 hours CBG:  Recent Labs Lab 05/24/14 1126 05/24/14 1637 05/24/14 2115 05/25/14 0744 05/25/14 1209  GLUCAP 129* 111* 127* 115* 125*    Recent Results (from the past 240 hour(s))  CULTURE, BLOOD (ROUTINE X 2)     Status: None   Collection Time    06/20/2014  7:56 PM      Result Value Ref Range Status   Specimen Description BLOOD   Final   Special Requests NONE   Final   Culture CANCELLED BY LAB DUPLICATE REQUEST   Final   Report Status 05/24/2014 FINAL   Final  CULTURE, BLOOD (ROUTINE X  2)     Status: None   Collection Time    06/10/2014  7:56 PM      Result Value Ref Range Status   Specimen Description BLOOD   Final   Special Requests NONE   Final   Culture CANCELLED BY LAB DUPLICATE REQUEST   Final   Report Status 05/24/2014 FINAL   Final  CULTURE, BLOOD (ROUTINE X 2)     Status: None   Collection Time    05/21/2014  7:56 PM      Result Value Ref Range Status   Specimen Description BLOOD RIGHT ANTECUBITAL   Final   Special Requests BOTTLES DRAWN AEROBIC AND ANAEROBIC 8CC EACH   Final   Culture NO GROWTH 2 DAYS   Final   Report Status PENDING   Incomplete  CULTURE, BLOOD (ROUTINE X 2)     Status: None   Collection Time    06/10/2014  7:56 PM      Result Value Ref Range Status   Specimen Description BLOOD RIGHT ANTECUBITAL   Final   Special Requests     Final   Value: BOTTLES DRAWN AEROBIC AND ANAEROBIC AEB=6CC ANA=8CC   Culture NO GROWTH 2 DAYS   Final   Report Status PENDING   Incomplete     Studies: No results found.  Scheduled Meds: . amiodarone  400 mg Oral Daily  . atorvastatin  20 mg Oral q1800  . furosemide  20 mg Intravenous BID  . gabapentin  300 mg Oral BID  . insulin aspart  0-9 Units Subcutaneous TID WC  . mirabegron ER  50 mg Oral Daily  . pantoprazole  40 mg Oral Daily  . piperacillin-tazobactam (ZOSYN)  IV  3.375 g Intravenous Q8H  . potassium chloride SA  20 mEq Oral BID  . senna  1 tablet Oral BID  . silver sulfADIAZINE   Topical Daily  . vancomycin  2,000 mg Intravenous Q12H  . warfarin  4 mg Oral Once  . Warfarin - Pharmacist Dosing Inpatient   Does not apply Q24H   Continuous Infusions:   Principal Problem:   Sepsis Active Problems:   Hyperlipemia   GERD (gastroesophageal reflux disease)   Cellulitis   Morbid obesity   Obesity hypoventilation syndrome   Chronic anticoagulation   Venous stasis dermatitis of both lower extremities   Cellulitis of leg, right   Skin tear   UTI (urinary tract infection)    Time spent: [facility] Pager [phone]. If 7PM-7AM, please contact night-coverage at [email or web address], password [facility] 05/25/2014, 12:49 PM  LOS: 2 days

## 2014-05-25 NOTE — Progress Notes (Signed)
Patient called regarding INR results from Monday - She has since gone to hospital.  Called her - hospital will adjust warfarin.  Henrene Pastorammy Abas Leicht, PharmD, CPP

## 2014-05-26 DIAGNOSIS — R600 Localized edema: Secondary | ICD-10-CM | POA: Diagnosis present

## 2014-05-26 DIAGNOSIS — J9600 Acute respiratory failure, unspecified whether with hypoxia or hypercapnia: Secondary | ICD-10-CM

## 2014-05-26 DIAGNOSIS — I83893 Varicose veins of bilateral lower extremities with other complications: Secondary | ICD-10-CM

## 2014-05-26 LAB — GLUCOSE, CAPILLARY
GLUCOSE-CAPILLARY: 133 mg/dL — AB (ref 70–99)
Glucose-Capillary: 151 mg/dL — ABNORMAL HIGH (ref 70–99)
Glucose-Capillary: 140 mg/dL — ABNORMAL HIGH (ref 70–99)
Glucose-Capillary: 136 mg/dL — ABNORMAL HIGH (ref 70–99)

## 2014-05-26 LAB — BASIC METABOLIC PANEL
Anion gap: 8 (ref 5–15)
BUN: 13 mg/dL (ref 6–23)
CO2: 31 mEq/L (ref 19–32)
Calcium: 8.5 mg/dL (ref 8.4–10.5)
Chloride: 100 mEq/L (ref 96–112)
Creatinine, Ser: 0.92 mg/dL (ref 0.50–1.10)
GFR calc Af Amer: 72 mL/min — ABNORMAL LOW (ref >90)
GFR calc non Af Amer: 62 mL/min — ABNORMAL LOW (ref >90)
Glucose, Bld: 147 mg/dL — ABNORMAL HIGH (ref 70–99)
Potassium: 4.4 mEq/L (ref 3.7–5.3)
Sodium: 139 mEq/L (ref 137–147)

## 2014-05-26 LAB — PROTIME-INR
INR: 2.28 — ABNORMAL HIGH (ref 0.00–1.49)
Prothrombin Time: 25.1 seconds — ABNORMAL HIGH (ref 11.6–15.2)

## 2014-05-26 LAB — CBC
HCT: 41.1 % (ref 36.0–46.0)
Hemoglobin: 13.7 g/dL (ref 12.0–15.0)
MCH: 34.4 pg — ABNORMAL HIGH (ref 26.0–34.0)
MCHC: 33.3 g/dL (ref 30.0–36.0)
MCV: 103.3 fL — ABNORMAL HIGH (ref 78.0–100.0)
Platelets: 151 10*3/uL (ref 150–400)
RBC: 3.98 MIL/uL (ref 3.87–5.11)
RDW: 16.2 % — ABNORMAL HIGH (ref 11.5–15.5)
WBC: 7.9 10*3/uL (ref 4.0–10.5)

## 2014-05-26 MED ORDER — IPRATROPIUM-ALBUTEROL 0.5-2.5 (3) MG/3ML IN SOLN: 3.0000 mL | RESPIRATORY_TRACT | Status: DC | PRN

## 2014-05-26 MED ORDER — WARFARIN SODIUM 2 MG PO TABS
2.0000 mg | ORAL_TABLET | Freq: Once | ORAL | Status: AC
Start: 2014-05-26 — End: 2014-05-26
  Administered 2014-05-26: 2 mg via ORAL
  Filled 2014-05-26: qty 1

## 2014-05-26 MED ORDER — BISACODYL 10 MG RE SUPP: 10.0000 mg | Freq: Every day | RECTAL | Status: DC | PRN

## 2014-05-26 MED ORDER — SORBITOL 70 % SOLN
30.0000 mL | Status: AC
  Administered 2014-05-26: 30 mL via ORAL
  Filled 2014-05-26: qty 30

## 2014-05-26 MED ORDER — ONDANSETRON HCL 4 MG/2ML IJ SOLN
4.0000 mg | Freq: Four times a day (QID) | INTRAMUSCULAR | Status: DC | PRN
  Administered 2014-05-26: 4 mg via INTRAVENOUS
  Filled 2014-05-26: qty 2

## 2014-05-26 MED ORDER — SENNOSIDES-DOCUSATE SODIUM 8.6-50 MG PO TABS
1.0000 | ORAL_TABLET | Freq: Two times a day (BID) | ORAL | Status: DC
  Administered 2014-05-26 (×2): 1 via ORAL
  Filled 2014-05-26 (×2): qty 1

## 2014-05-26 MED ORDER — SODIUM CHLORIDE 0.9 % IV SOLN
1.0000 g | INTRAVENOUS | Status: DC
  Administered 2014-05-26: 1 g via INTRAVENOUS
  Filled 2014-05-26 (×2): qty 1

## 2014-05-26 NOTE — Clinical Social Work Placement (Signed)
Clinical Social Work Department CLINICAL SOCIAL WORK PLACEMENT NOTE 05/26/2014  Patient:  Kathleen [name],Kathleen [name]  Account Number:  [id number] Admit date:  03/02/2014  Clinical Social Worker:  Derenda FennelKARA Marayah Higdon, LCSW  Date/time:  05/26/2014 02:55 PM  Clinical Social Work is seeking post-discharge placement for this patient at the following level of care:   SKILLED NURSING   (*CSW will update this form in Epic as items are completed)   05/26/2014  Patient/family provided with [facility] Department of Clinical Social Work's list of facilities offering this level of care within the geographic area requested by the patient (or if unable, by the patient's family).  05/26/2014  Patient/family informed of their freedom to choose among providers that offer the needed level of care, that participate in Medicare, Medicaid or managed care program needed by the patient, have an available bed and are willing to accept the patient.  05/26/2014  Patient/family informed of [facility]' ownership interest in [facility], as well as of the fact that they are under no obligation to receive care at this facility.  PASARR submitted to EDS on  PASARR number received on   FL2 transmitted to all facilities in geographic area requested by pt/family on  05/26/2014 FL2 transmitted to all facilities within larger geographic area on   Patient informed that his/her managed care company has contracts with or will negotiate with  certain facilities, including the following:     Patient/family informed of bed offers received:  05/26/2014 [facility] Physician recommends and patient [facility]  Patient to be transferred to  on   Patient to be transferred to facility by  Patient and family notified of transfer on  Name of family member notified:    The following physician request were entered in Epic:   Additional Comments: Pt has existing pasarr.  Derenda FennelKara  Zeriah Baysinger, [place] [phone]

## 2014-05-26 NOTE — Clinical Social Work Placement (Signed)
Clinical Social Work Department CLINICAL SOCIAL WORK PLACEMENT NOTE 05/26/2014  Patient:  Pecolia AdesHARRIS,Danamarie J  Account Number:  [id number] Admit date:  06/20/2014  Clinical Social Worker:  Derenda FennelKARA Nabeel Gladson, LCSW  Date/time:  05/26/2014 02:55 PM  Clinical Social Work is seeking post-discharge placement for this patient at the following level of care:   SKILLED NURSING   (*CSW will update this form in Epic as items are completed)   05/26/2014  Patient/family provided with [facility] Department of Clinical Social Work's list of facilities offering this level of care within the geographic area requested by the patient (or if unable, by the patient's family).  05/26/2014  Patient/family informed of their freedom to choose among providers that offer the needed level of care, that participate in Medicare, Medicaid or managed care program needed by the patient, have an available bed and are willing to accept the patient.  05/26/2014  Patient/family informed of [facility]' ownership interest in [facility], as well as of the fact that they are under no obligation to receive care at this facility.  PASARR submitted to EDS on  PASARR number received on   FL2 transmitted to all facilities in geographic area requested by pt/family on  05/26/2014 FL2 transmitted to all facilities within larger geographic area on   Patient informed that his/her managed care company has contracts with or will negotiate with  certain facilities, including the following:     Patient/family informed of bed offers received:   Patient chooses bed at  Physician recommends and patient chooses bed at    Patient to be transferred to  on   Patient to be transferred to facility by  Patient and family notified of transfer on  Name of family member notified:    The following physician request were entered in Epic:   Additional Comments: Pt has existing pasarr.  Derenda FennelKara Mykia Holton, [place]CS[place] [phone]

## 2014-05-26 NOTE — Clinical Social Work Note (Signed)
CS[facility] and [facility] Called pt's pastor/POA at her request and discussed placement. [facility] and Jonny RuizJohn states he can bring this to hospital. Anticipate d/c tomorrow.   Derenda FennelKara Antavia Tandy, [place] [phone]

## 2014-05-26 NOTE — Evaluation (Signed)
Physical Therapy Evaluation Patient Details Name: Kathleen [name] MRN: [id number] DOB: 12/24/1945 Today's Date: 05/26/2014   History of Present Illness  Patient fell on a truck into the grass resulting in Rt LE fibula fracture  Clinical Impression  Patient requires max assistance with all bed mobility and transfers. Patient will have inadequate assistance upon discharge to care for herself and agreed to SNF upon discharge though she was initially disagreeable towards SNF. Patient required 2 person assist for sit to supine secondary to increased fatigue and patient noting increased dizziness. Nursing was notified of mobility status with nurse noting patient had be able to walk prior to today with min assist, though increased fatigue may be due to consumption of medication to improve sleep.      Follow Up Recommendations SNF    Equipment Recommendations  Rolling walker with 5" wheels    Recommendations for Other Services       Precautions / Restrictions Precautions Precautions: Fall Precaution Comments: history of multiple falls Restrictions Weight Bearing Restrictions: No Other Position/Activity Restrictions: WBAT      Mobility  Bed Mobility Overal bed mobility: Needs Assistance;+2 for physical assistance Bed Mobility: Rolling;Supine to Sit;Sit to Supine Rolling: Mod assist   Supine to sit: Max assist Sit to supine: Max assist;+2 for physical assistance      Transfers                 General transfer comment: unable to assess due to dizziness and weakness and sitting resulting in decreased tolerance to perform sit to stand  Ambulation/Gait                Stairs            Wheelchair Mobility    Modified Rankin (Stroke Patients Only)       Balance Overall balance assessment: Needs assistance Sitting-balance support: Bilateral upper extremity supported Sitting balance-Leahy Scale: Zero Sitting balance - Comments: after <3 minutes aptient states  she is to weak to keep her self sitting up despite bilateral UE use and physical therapist assist Postural control: Posterior lean Standing balance support:  (unable to assess due to inability to perform sit to stand)                                 Pertinent Vitals/Pain Pain Assessment: No/denies pain    Home Living Family/patient expects to be discharged to:: Private residence Living Arrangements: Alone Available Help at Discharge: Family;Available PRN/intermittently Type of Home: House Home Access: Level entry     Home Layout: One level Home Equipment: Grab bars - tub/shower;Walker - 2 wheels;Shower seat - built in      Prior Function Level of Independence: Independent with assistive device(s);Needs assistance   Gait / Transfers Assistance Needed: sit to stand from toilet, sit to stand, scared to use walk in bath tub  ADL's / Homemaking Assistance Needed: needed help taking a bath  Comments: unable to drive     Hand Dominance   Dominant Hand: Right    Extremity/Trunk Assessment   Upper Extremity Assessment: Defer to OT evaluation           Lower Extremity Assessment: Generalized weakness RLE Deficits / Details: Rt LE generalized weakness    Cervical / Trunk Assessment: Normal  Communication   Communication: No difficulties  Cognition Arousal/Alertness: Awake/alert Behavior During Therapy: WFL for tasks assessed/performed Overall Cognitive Status: Within Functional Limits for tasks assessed  General Comments      Exercises        Assessment/Plan    PT Assessment Patient needs continued PT services  PT Diagnosis Difficulty walking;Generalized weakness   PT Problem List Decreased strength;Decreased range of motion;Decreased activity tolerance;Decreased balance;Decreased mobility;Decreased coordination;Obesity;Decreased safety awareness  PT Treatment Interventions Gait training;Functional mobility  training;Therapeutic activities;Therapeutic exercise;Balance training;Neuromuscular re-education;Patient/family education   PT Goals (Current goals can be found in the Care Plan section) Acute Rehab PT Goals Patient Stated Goal: to be able to go home PT Goal Formulation: With patient Time For Goal Achievement: 06/02/14 Potential to Achieve Goals: Fair    Frequency Min 3X/week   Barriers to discharge Decreased caregiver support patient has an insufficient home support system and will require 24hour assist.     Co-evaluation               End of Session   Activity Tolerance: Patient limited by fatigue Patient left: in bed;with call bell/phone within reach Nurse Communication: Precautions;Mobility status (at increased risk of falls secondary to weakness)         Time: 1410-1440 PT Time Calculation (min): 30 min   Charges:   PT Evaluation $Initial PT Evaluation Tier I: 1 Procedure PT Treatments $Therapeutic Activity: 23-37 mins   PT G Codes:          Valentino Saavedra R 05/26/2014, 2:51 PM

## 2014-05-26 NOTE — Progress Notes (Signed)
Pt seen and examined. Agree with assessment and plan per Ms. Black. Briefly, pt presents with sepsis with RLE cellulitis. Cellulitis improving with current abx. Now on ertapenem. PT/OT consulted with recs for SNF. Otherwise stable. Cont to encourage CPAP when sleeping. Will follow.

## 2014-05-26 NOTE — Clinical Social Work Psychosocial (Signed)
Clinical Social Work Department BRIEF PSYCHOSOCIAL ASSESSMENT 05/26/2014  Patient:  Kathleen [name], Kathleen [name]     Account Number:  [id number]     Admit date:  05/22/2014  Clinical Social Worker:  Wyatt Haste  Date/Time:  05/26/2014 02:58 PM  Referred by:  Physician  Date Referred:  05/26/2014 Referred for  SNF Placement   Other Referral:   Interview type:  Patient Other interview type:    PSYCHOSOCIAL DATA Living Status:  ALONE Admitted from facility:   Level of care:   Primary support name:   Primary support relationship to patient:  FRIEND Degree of support available:   Kathleen [name] is best support    CURRENT CONCERNS Current Concerns  Post-Acute Placement   Other Concerns:    SOCIAL WORK ASSESSMENT / PLAN CSW met with pt at bedside. Pt reports she lives alone. She has an aide with [employer] from 8-12 Monday through Friday. Pt describes her best support as her pastor, who checks on her daily and brings meals some. Pt admitted with sepsis due to cellulitis. Pt also has an ankle fracture. At baseline, pt indicates she ambulates with a walker. She no longer drives, but has her aide for transportation. Pt is aware of recommendation for SNF after PT evaluation today. She was reluctant as she has been to SNF in the past. However, pt realizes that she cannot manage at home in current condition and only intends to be at SNF short term. Agreeable to [facility] or [facility]. SNF list provided and pt aware of Medicare coverage/criteria.   Assessment/plan status:  Psychosocial Support/Ongoing Assessment of Needs Other assessment/ plan:   Information/referral to community resources:   SNF list    PATIENT'S/FAMILY'S RESPONSE TO PLAN OF CARE: Pt agreeable to short term rehab prior to return home. CSW will initiate bed search and follow up with offers.       Kathleen [name], [place]

## 2014-05-26 NOTE — Progress Notes (Addendum)
ANTIBIOTIC CONSULT NOTE - follow up  Pharmacy Consult for Vancomycin & Zosyn Indication: Sepsis/cellulitis  Allergies  Allergen Reactions  . Gabapentin Other (See Comments)    Dizziness  . Lovenox [Enoxaparin Sodium] Itching    Itching, reddness  Patient Measurements: Height: 5\' 4"  (162.6 cm) Weight: 352 lb 6.4 oz (159.848 kg) IBW/kg (Calculated) : 54.7  Vital Signs: Temp: 98 F (36.7 C) (08/06 0644) Temp src: Oral (08/06 0644) BP: 106/52 mmHg (08/06 0828) Pulse Rate: 72 (08/06 0828) Intake/Output from previous day: 08/05 0701 - 08/06 0700 In: 1730 [P.O.:580; IV Piggyback:1150] Out: 2650 [Urine:2650] Intake/Output from this shift: Total I/O In: 120 [P.O.:120] Out: 1000 [Urine:1000]  Labs:  Recent Labs  05/24/14 0459 05/25/14 0650 05/26/14 0620 05/26/14 0720  WBC 6.2 6.9 7.9  --   HGB 12.3 12.6 13.7  --   PLT 172 155 151  --   CREATININE 0.81 0.91  --  0.92   Estimated Creatinine Clearance: 88.1 ml/min (by C-G formula based on Cr of 0.92). No results found for this basename: VANCOTROUGH, Leodis Binet, VANCORANDOM, GENTTROUGH, GENTPEAK, GENTRANDOM, TOBRATROUGH, TOBRAPEAK, TOBRARND, AMIKACINPEAK, AMIKACINTROU, AMIKACIN,  in the last 72 hours   Microbiology: Recent Results (from the past 720 hour(s))  AEROBIC CULTURE     Status: Abnormal   Collection Time    06-05-14  2:08 PM      Result Value Ref Range Status   Aerobic Bacterial Culture Final report (*)  Final   Result 1 Comment (*)  Final   Comment: Enterobacter cloacae complex     Heavy growth   Result 2 Proteus mirabilis (*)  Final   Comment: Light growth   Result 3 Routine flora   Final   Comment: Heavy growth   ANTIMICROBIAL SUSCEPTIBILITY Comment   Final   Comment:       ** S = Susceptible; I = Intermediate; R = Resistant **                        P = Positive; N = Negative                 MICS are expressed in micrograms per mL        Antibiotic                 RSLT#1    RSLT#2    RSLT#3    RSLT#4    Amoxicillin/Clavulanic Acid    R         S     Ampicillin                               S     Cefazolin                      R     Cefepime                       S         S     Ceftriaxone                    S         S     Cefuroxime                     R  S     Ciprofloxacin                  S         R     Ertapenem                      S         S     Gentamicin                     S         S     Imipenem                       S     Levofloxacin                   S         R     Piperacillin                   S         S     Tetracycline                   S         R     Tobramycin                     S         S     Trimethoprim/Sulfa             S         S  CULTURE, BLOOD (ROUTINE X 2)     Status: None   Collection Time    06/06/2014  7:56 PM      Result Value Ref Range Status   Specimen Description BLOOD   Final   Special Requests NONE   Final   Culture CANCELLED BY LAB DUPLICATE REQUEST   Final   Report Status 05/24/2014 FINAL   Final  CULTURE, BLOOD (ROUTINE X 2)     Status: None   Collection Time    06/07/2014  7:56 PM      Result Value Ref Range Status   Specimen Description BLOOD   Final   Special Requests NONE   Final   Culture CANCELLED BY LAB DUPLICATE REQUEST   Final   Report Status 05/24/2014 FINAL   Final  CULTURE, BLOOD (ROUTINE X 2)     Status: None   Collection Time    06/01/2014  7:56 PM      Result Value Ref Range Status   Specimen Description BLOOD RIGHT ANTECUBITAL   Final   Special Requests BOTTLES DRAWN AEROBIC AND ANAEROBIC 8CC EACH   Final   Culture NO GROWTH 3 DAYS   Final   Report Status PENDING   Incomplete  CULTURE, BLOOD (ROUTINE X 2)     Status: None   Collection Time    06/03/2014  7:56 PM      Result Value Ref Range Status   Specimen Description BLOOD RIGHT ANTECUBITAL   Final   Special Requests     Final   Value: BOTTLES DRAWN AEROBIC AND ANAEROBIC AEB=6CC ANA=8CC   Culture NO GROWTH 3 DAYS   Final   Report Status PENDING    Incomplete  URINE CULTURE     Status: None   Collection Time  05/24/14  4:25 PM      Result Value Ref Range Status   Specimen Description URINE, CATHETERIZED   Final   Special Requests NONE   Final   Culture  Setup Time     Final   Value: 05/24/2014 23:47     Performed at [facility]   [place] Count     Final   Value: NO GROWTH     Performed at [facility]   Culture     Final   Value: NO GROWTH     Performed at [facility]   Report Status 05/25/2014 FINAL   Final   Medical History: Past Medical History  Diagnosis Date  . HTN (hypertension)   . Hyperlipemia   . GERD (gastroesophageal reflux disease)   . Obesity   . Sleep apnea     she has not been on CPAP  . PE (pulmonary embolism) 05/2011  . DOE (dyspnea on exertion) 01/22/2012  . Hematuria 01/22/2012  . Diabetes mellitus without complication   . A-fib   . Neurogenic bladder   . CHF (congestive heart failure)   . Ulcer     both feet   Medications:  Scheduled:  . amiodarone  400 mg Oral Daily  . atorvastatin  20 mg Oral q1800  . furosemide  20 mg Intravenous BID  . gabapentin  300 mg Oral BID  . insulin aspart  0-9 Units Subcutaneous TID WC  . mirabegron ER  50 mg Oral Daily  . pantoprazole  40 mg Oral Daily  . piperacillin-tazobactam (ZOSYN)  IV  3.375 g Intravenous Q8H  . potassium chloride SA  20 mEq Oral BID  . senna-docusate  1 tablet Oral BID  . silver sulfADIAZINE   Topical Daily  . sorbitol  30 mL Oral NOW  . vancomycin  2,000 mg Intravenous Q12H  . warfarin  2 mg Oral Once  . Warfarin - Pharmacist Dosing Inpatient   Does not apply Q24H   Assessment: Cellulitis/sepsis, leg cellulitis likely secondary to underlying PVD, venous stasis dermatitis and additional edema from recent fracture/skin tears.  Morbidly obese patient,  Normalized CrCl 60-65 ml/min Urine cx (-),  Blood cx (-)  Aerobic Bacterial Culture  Final report (A)   Result 1  Comment (A)   Comments: Enterobacter  cloacae complex Heavy growth Result 2  Proteus mirabilis (A)   Comments: Light growth Result 3  Routine flora   Comments: Heavy growth ANTIMICROBIAL SUSCEPTIBILITY  Comment   Comments: ** S = Susceptible; I = Intermediate; R = Resistant ** P = Positive; N = Negative MICS are expressed in micrograms per mL Antibiotic RSLT#1 RSLT#2 RSLT#3 RSLT#4 Amoxicillin/Clavulanic Acid R S Ampicillin S Cefazolin R Cefepime S S Ceftriaxone S S Cefuroxime R S Ciprofloxacin S R Ertapenem S S Gentamicin S S Imipenem S Levofloxacin S R Piperacillin S S Tetracycline S R Tobramycin S S Trimethoprim/Sulfa S S  Goal of Therapy:  Vancomycin trough level 15-20 mcg/ml  Plan:  Vancomycin 2000mg  IV every 12 hours Zosyn 3.375 GM IV every 8 hours, infused over 4 hours Vancomycin trough level if abx not changed.  RECOMMENDATION:  Consider d/c Vancomycin and Zosyn and switch to Ertapenem 1gm IV q24hrs as single agent.  Can then transition to Bactrim DS 1 po q12hrs when appropriate.    Margo AyeHall, Luverta Korte A 05/26/2014,10:46 AM

## 2014-05-26 NOTE — Progress Notes (Signed)
ANTICOAGULATION CONSULT NOTE - follow up  Pharmacy Consult for Coumadin Indication: atrial fibrillation  Allergies  Allergen Reactions  . Gabapentin Other (See Comments)    Dizziness  . Lovenox [Enoxaparin Sodium] Itching    Itching, reddness   Patient Measurements: Height: 5\' 4"  (162.6 cm) Weight: 352 lb 6.4 oz (159.848 kg) IBW/kg (Calculated) : 54.7  Vital Signs: Temp: 98 F (36.7 C) (08/06 0644) Temp src: Oral (08/06 0644) BP: 106/52 mmHg (08/06 0828) Pulse Rate: 72 (08/06 0828)  Labs:  Recent Labs  10-29-2013 1956 05/24/14 0459 05/25/14 0650 05/26/14 0620 05/26/14 0720  HGB 13.8 12.3 12.6 13.7  --   HCT 40.9 38.6 39.0 41.1  --   PLT 182 172 155 151  --   APTT 31  --   --   --   --   LABPROT  --  19.9* 22.7* 25.1*  --   INR  --  1.69* 2.00* 2.28*  --   CREATININE 0.88 0.81 0.91  --  0.92   Estimated Creatinine Clearance: 88.1 ml/min (by C-G formula based on Cr of 0.92).  Medical History: Past Medical History  Diagnosis Date  . HTN (hypertension)   . Hyperlipemia   . GERD (gastroesophageal reflux disease)   . Obesity   . Sleep apnea     she has not been on CPAP  . PE (pulmonary embolism) 05/2011  . DOE (dyspnea on exertion) 01/22/2012  . Hematuria 01/22/2012  . Diabetes mellitus without complication   . A-fib   . Neurogenic bladder   . CHF (congestive heart failure)   . Ulcer     both feet   Medications:  Scheduled:  . amiodarone  400 mg Oral Daily  . atorvastatin  20 mg Oral q1800  . furosemide  20 mg Intravenous BID  . gabapentin  300 mg Oral BID  . insulin aspart  0-9 Units Subcutaneous TID WC  . mirabegron ER  50 mg Oral Daily  . pantoprazole  40 mg Oral Daily  . piperacillin-tazobactam (ZOSYN)  IV  3.375 g Intravenous Q8H  . potassium chloride SA  20 mEq Oral BID  . senna-docusate  1 tablet Oral BID  . silver sulfADIAZINE   Topical Daily  . vancomycin  2,000 mg Intravenous Q12H  . Warfarin - Pharmacist Dosing Inpatient   Does not apply Q24H    Assessment: Continuation of Coumadin PTA INR is now therapeutic.  Home dose reportedly Coumadin 3mg  daily (dose may need adjustment).  Pt c/o n/v and reportedly vomited x 1 today.  INR trending up.  Will empirically reduce Coumadin in setting of n/v to discourage overshoot.  Goal of Therapy:  INR 2-3 Monitor platelets by anticoagulation protocol: Yes   Plan:  Coumadin 2mg  po today x 1  INR/PT daily Labs per protocol  Valrie HartHall, Levonne Carreras A 05/26/2014,10:35 AM

## 2014-05-26 NOTE — Progress Notes (Signed)
Patient having pain and nausea. Vomited x1. Clydie BraunKaren on floor and notified. Will order medication.

## 2014-05-26 NOTE — Progress Notes (Signed)
[facility] PROGRESS NOTE  Kathleen [name]:[id number] DOB: [birth year] DOA: 06/05/2014 PCP: Redge Gainer, MD  Assessment/Plan: Sepsis: related to R leg cellulitis likely secondary to undelrying PVD and venous stasis dermatitis and additional edema from recent fracture and skin tears. Pt met SIRS criteria and likely sepsis due to hypotension, respiratory rate, edema. Blood cultures no growth to date.  Discontinue Vanc/Zosyn due to wound culture. See below.   Cellulitis/LE edema: likely related to fall resulting in skin tear on right foot 7/29 in setting of chronic venous stasis. Noted improvement today but continues with edema. Continue lasix IV started. Aerobic wound culture noted. Spoke to pharmacy who recommends Ertapenem based on sensitivities with plan to  Transition Bactrim DS when tolerating po's. tomorrow.  Remains afebrile and non-toxic appearing. DC vanc and zosyn.   N/V: etiology uncertain. 1 episode small amount undigested food at breakfast. Will provide anti-emetic. Monitor. Last BM yesterday. Add senokot  Ankle Fracture: R distal fib and possible R 5th metatarsal. Cam walker boot at time of presentation per ortho. Up w/ assistance. Weight bearing as tolerated with boot per ortho. Physical therapy. Follow up with Dr Aline Brochure in 6 weeks. May benefit rehab. She is opposed to idea.   Right foot wound/skin tear/right toe wound. Silvadene wrap. Appreciate wound consult. Right foot much improved with less erythema and less swelling. Continue therapy as above.   Afib: rate controlled. Last Echo EF 44-01% adn no diastolic dysfunction. Continue home amiodarone. coumadin per pharmacy. INR 2.28. today   DM: Pre DM per last A1c of 5.9 on 7/24. CBG range 119-151. SSI PRN   Obstructive sleep apnea. CPAP : tolerated last night and reports sleeping better   HTN: controlled. Continue to hold torsemide   Morbid obesity: 57.6. Nutritional consult.    Code Status: full Family  Communication: none present Disposition Plan: home or rehab hopefully tomorrow    Consultants:  none  Procedures:  none  Antibiotics: Vancomycin 06/05/2014>>>05/26/14  Zosyn 05/25/2014>>05/26/14 Ertapenem 05/26/14>>   HPI/Subjective: Sitting up eating breakfast. Reports feeling "better" . Denies pain /discomfort.   Objective: Filed Vitals:   05/26/14 0828  BP: 106/52  Pulse: 72  Temp:   Resp:     Intake/Output Summary (Last 24 hours) at 05/26/14 1004 Last data filed at 05/26/14 0949  Gross per 24 hour  Intake   1110 ml  Output   2650 ml  Net  -1540 ml   Filed Weights   06/05/2014 1806 05/26/14 0644  Weight: 151.955 kg (335 lb) 159.848 kg (352 lb 6.4 oz)    Exam:   General:  Morbidly obese appears comfortable  Cardiovascular: RRR murmur 1-2+LE edema on right 1+ on left  Respiratory: mild increased work of breathing. BS clear bilaterally no wheeze  Abdomen: obese soft non-tender non-distended +BS throughout no guarding  Musculoskeletal: right ankle/leg with receding erythema and less swelling. Less heat as well. Right foot less erythema and less swelling. Dressing right foot dry and intact.  Data Reviewed: Basic Metabolic Panel:  Recent Labs Lab 05/22/2014 1956 05/24/14 0459 05/25/14 0650 05/26/14 0720  NA 140 141 139 139  K 3.7 4.0 4.2 4.4  CL 97 100 101 100  CO2 32 _0 GLUCOSE 108* 126* 137* 147*  BUN 27* _1 CREATININE 0.88 0.81 0.91 0.92  CALCIUM 9.0 8.5 8.3* 8.5   Liver Function Tests:  Recent Labs Lab 05/26/2014 1956 05/24/14 0459  AST 45* 40*  ALT 39* 35  ALKPHOS 57 54  BILITOT 0.5 0.4  PROT 7.9 7.1  ALBUMIN 2.9* 2.5*   No results found for this basename: LIPASE, AMYLASE,  in the last 168 hours No results found for this basename: AMMONIA,  in the last 168 hours CBC:  Recent Labs Lab 06/03/2014 1956 05/24/14 0459 05/25/14 0650 05/26/14 0620  WBC 9.1 6.2 6.9 7.9  NEUTROABS 6.6  --   --   --   HGB 13.8 12.3 12.6 13.7  HCT 40.9  38.6 39.0 41.1  MCV 98.6 100.5* 101.3* 103.3*  PLT 182 172 155 151   Cardiac Enzymes: No results found for this basename: CKTOTAL, CKMB, CKMBINDEX, TROPONINI,  in the last 168 hours BNP (last 3 results) No results found for this basename: PROBNP,  in the last 8760 hours CBG:  Recent Labs Lab 05/25/14 0744 05/25/14 1209 05/25/14 1641 05/25/14 2245 05/26/14 0731  GLUCAP 115* 125* 156* 119* 151*    Recent Results (from the past 240 hour(s))  AEROBIC CULTURE     Status: Abnormal   Collection Time    05/25/2014  2:08 PM      Result Value Ref Range Status   Aerobic Bacterial Culture Final report (*)  Final   Result 1 Comment (*)  Final   Comment: Enterobacter cloacae complex     Heavy growth   Result 2 Proteus mirabilis (*)  Final   Comment: Light growth   Result 3 Routine flora   Final   Comment: Heavy growth   ANTIMICROBIAL SUSCEPTIBILITY Comment   Final   Comment:       ** S = Susceptible; I = Intermediate; R = Resistant **                        P = Positive; N = Negative                 MICS are expressed in micrograms per mL        Antibiotic                 RSLT#1    RSLT#2    RSLT#3    RSLT#4     Amoxicillin/Clavulanic Acid    R         S     Ampicillin                               S     Cefazolin                      R     Cefepime                       S         S     Ceftriaxone                    S         S     Cefuroxime                     R         S     Ciprofloxacin                  S         R     Ertapenem  S         S     Gentamicin                     S         S     Imipenem                       S     Levofloxacin                   S         R     Piperacillin                   S         S     Tetracycline                   S         R     Tobramycin                     S         S     Trimethoprim/Sulfa             S         S  CULTURE, BLOOD (ROUTINE X 2)     Status: None   Collection Time    06/13/2014  7:56 PM      Result  Value Ref Range Status   Specimen Description BLOOD   Final   Special Requests NONE   Final   Culture CANCELLED BY LAB DUPLICATE REQUEST   Final   Report Status 05/24/2014 FINAL   Final  CULTURE, BLOOD (ROUTINE X 2)     Status: None   Collection Time    05/26/2014  7:56 PM      Result Value Ref Range Status   Specimen Description BLOOD   Final   Special Requests NONE   Final   Culture CANCELLED BY LAB DUPLICATE REQUEST   Final   Report Status 05/24/2014 FINAL   Final  CULTURE, BLOOD (ROUTINE X 2)     Status: None   Collection Time    05/22/2014  7:56 PM      Result Value Ref Range Status   Specimen Description BLOOD RIGHT ANTECUBITAL   Final   Special Requests BOTTLES DRAWN AEROBIC AND ANAEROBIC 8CC EACH   Final   Culture NO GROWTH 2 DAYS   Final   Report Status PENDING   Incomplete  CULTURE, BLOOD (ROUTINE X 2)     Status: None   Collection Time    06/19/2014  7:56 PM      Result Value Ref Range Status   Specimen Description BLOOD RIGHT ANTECUBITAL   Final   Special Requests     Final   Value: BOTTLES DRAWN AEROBIC AND ANAEROBIC AEB=6CC ANA=8CC   Culture NO GROWTH 2 DAYS   Final   Report Status PENDING   Incomplete  URINE CULTURE     Status: None   Collection Time    05/24/14  4:25 PM      Result Value Ref Range Status   Specimen Description URINE, CATHETERIZED   Final   Special Requests NONE   Final   Culture  Setup Time     Final   Value: 05/24/2014 23:47     Performed at [facility]  [place] Count     Final   Value: NO GROWTH     Performed at [facility]   Culture     Final   Value: NO GROWTH     Performed at [facility]   Report Status 05/25/2014 FINAL   Final     Studies: No results found.  Scheduled Meds: . amiodarone  400 mg Oral Daily  . atorvastatin  20 mg Oral q1800  . furosemide  20 mg Intravenous BID  . gabapentin  300 mg Oral BID  . insulin aspart  0-9 Units Subcutaneous TID WC  . mirabegron ER  50 mg Oral Daily  .  pantoprazole  40 mg Oral Daily  . piperacillin-tazobactam (ZOSYN)  IV  3.375 g Intravenous Q8H  . potassium chloride SA  20 mEq Oral BID  . senna-docusate  1 tablet Oral BID  . silver sulfADIAZINE   Topical Daily  . vancomycin  2,000 mg Intravenous Q12H  . Warfarin - Pharmacist Dosing Inpatient   Does not apply Q24H   Continuous Infusions:   Principal Problem:   Sepsis Active Problems:   Hyperlipemia   GERD (gastroesophageal reflux disease)   Cellulitis   Morbid obesity   Obesity hypoventilation syndrome   Chronic anticoagulation   Venous stasis dermatitis of both lower extremities   Cellulitis of leg, right   Skin tear   UTI (urinary tract infection)   Lower extremity edema    Time spent: 35 mintutes    [facility] Pager [phone]. If 7PM-7AM, please contact night-coverage at [email or web address], password [facility] 05/26/2014, 10:04 AM  LOS: 3 days

## 2014-05-27 LAB — BLOOD GAS, ARTERIAL
Acid-Base Excess: 4.9 mmol/L — ABNORMAL HIGH (ref 0.0–2.0)
Bicarbonate: 23.5 mEq/L (ref 20.0–24.0)
DRAWN BY: 22223
FIO2: 100 %
O2 Saturation: 96.5 %
PCO2 ART: 77.2 mmHg — AB (ref 35.0–45.0)
PH ART: 7.11 — AB (ref 7.350–7.450)
TCO2: 22.7 mmol/L (ref 0–100)
pO2, Arterial: 109 mmHg — ABNORMAL HIGH (ref 80.0–100.0)

## 2014-05-27 LAB — GLUCOSE, CAPILLARY: Glucose-Capillary: 306 mg/dL — ABNORMAL HIGH (ref 70–99)

## 2014-05-27 IMAGING — NM NM MYOCAR SINGLE W/SPECT W/WALL MOTION & EF
2 series · 12 of 12 positions shown · non-contrast
Comparison: None.

CLINICAL DATA: 67-year-old female with no known history of coronary
artery disease, with dyspnea on exertion.

EXAM:
MYOCARDIAL IMAGING WITH SPECT (REST AND PHARMACOLOGIC-STRESS)
GATED LEFT VENTRICULAR WALL MOTION STUDY
LEFT VENTRICULAR EJECTION FRACTION
TECHNIQUE: Standard myocardial SPECT imaging was performed after resting
intravenous injection of 10 mCi 8c-HHm sestamibi. Subsequently,
intravenous infusion of Lexiscan was performed under the supervision
of the Cardiology staff. At peak effect of the drug, 30 mCi 8c-HHm
sestamibi was injected intravenously and standard myocardial SPECT
imaging was performed. Quantitative gated imaging was also performed
to evaluate left ventricular wall motion, and estimate left
ventricular ejection fraction.

[Series 1: cr cardiac tc low dose · 6.41mm/px · 6 of 64 frames shown]
[frame 6/64]
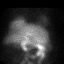
[frame 16/64]
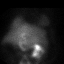
[frame 27/64]
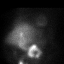
[frame 38/64]
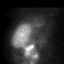
[frame 48/64]
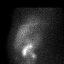
[frame 59/64]
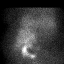

[Series 1: cs cardiac tc hi dose · 6.41mm/px · 6 of 512 frames shown]
[frame 43/512]
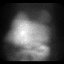
[frame 128/512]
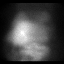
[frame 214/512]
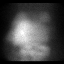
[frame 299/512]
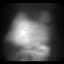
[frame 384/512]
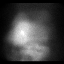
[frame 470/512]
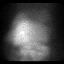

[12 of 12 positions shown; findings below may reference images not displayed]

FINDINGS: Pharmacological stress

Baseline EKG showed normal sinus rhythm, right bundle branch block.
Following injection, heart rate increased from 75 beats per min to
82 beats per min and blood pressure decreased from 118/62 mmHg to
112/58 mmHg. The test was terminated after injection was completed.
The patient did not experience any chest pain. Post-injection EKG
did not show any evidence of ischemia or arrhythmias.

Myocardial perfusion imaging

The raw images showed appropriate radiotracer uptake in the
myocardium. There was a small defect in the inferior wall in the
resting images that was less prominent in the post injection images.
Wall motion of the inferior wall was normal, this is consistent with
sub- diaphragmatic attenuation. There were no other myocardial
perfusion defects. Gated imaging showed an end-diastolic volume of
93 mL, end systolic volume of 23 mL, ejection fraction 75%, t.i.d.
0.83, and normal wall motion.
IMPRESSION: Negative Lexiscan MPI for ischemia

Normal LV systolic function and wall motion

## 2014-05-28 LAB — CULTURE, BLOOD (ROUTINE X 2)
Culture: NO GROWTH
Culture: NO GROWTH

## 2014-05-28 NOTE — Discharge Summary (Addendum)
Death Summary  Kathleen [name] QIO:[id number] DOB: June 08, 1946 DOA: 06/13/2014  PCP: Rudi HeapMOORE, DONALD, MD  Admit date: 05/25/2014 Date of Death: 06/12/2014  Final Diagnoses:  Principal Problem:   Sepsis Active Problems:   Hyperlipemia   GERD (gastroesophageal reflux disease)   Cellulitis   Morbid obesity   Obesity hypoventilation syndrome   Chronic anticoagulation   Venous stasis dermatitis of both lower extremities   Cellulitis of leg, right   Skin tear   UTI (urinary tract infection)   Lower extremity edema Hx CHF, unable to determine type Pre-diabetes  History of present illness:  Kathleen [name] was a 68 y.o. female presenting with fall on 7/26th. Developed pain and swelling since that time. Pain and swelling started to improve but then worsened again. Skin pulled back during the initial injury around the toe. Becoming more red adn swollen. Seen in PCP office on day of admit and told that she needed to come in to the hospital due to cellulitis.   Hospital Course:  The patient was admitted to the inpatient service. The patient was continued on empiric vancomycin and zosyn with gradual improvement of her LE cellulitis. The patient's antibiotics were later transitioned to ertapenem based on wound culture results. The patient intermittently used her CPAP for her OSA. On the early AM of 06/19/2014, the patient was noted to become increasingly bradycardic and subsequently asystolic. Code blue was called and the patient was intubated with a return of pulse after about 15min of CPR. Pt's POA was contacted, who reported that pt's wishes were not to be placed on ventilator. After arrival of pt's POA, the patient was subsequently extubated and she was pronounced at 0410 on 05/22/2014.  Time of death: 0410  Signed:  [place] Rollyson, Scheryl MartenSTEPHEN K  [facility] 05/28/2014, 8:20 AM

## 2014-05-30 MED FILL — Medication: Qty: 1 | Status: AC

## 2014-06-09 ENCOUNTER — Institutional Professional Consult (permissible substitution): Payer: Medicare Other | Admitting: Pulmonary Disease

## 2014-06-15 NOTE — Clinical Documentation Improvement (Deleted)
Please clarify if there is a link between pt's DM and PVD in setting cellulitis to right leg   . Document any associated diagnoses/conditions . Manifestation/Complication (document link to diabetes)  Possible Clinical Conditions?   _______Diabetes Type   or 2 _______Controlled or uncontrolled    Manifestations:  _______DM retinopathy  _______DM PVD _______DM neuropathy   _______DM nephropathy  Associated conditions: _______DM cellulitis _______DM gangrene   _______DM skin ulcer  _______Other Condition _______Cannot Clinically determine   Supporting Information: Risk Factors: Sepsis/Cellulitis R leg, right foot skin tear Signs & Symptoms: Diagnostics: Treatment: insulin aspart (novoLOG) injection 0-9 Units     Thank You, Enis Slipper ,RN Clinical Documentation Specialist:  [phone]  [facility]- Health Information Management

## 2014-06-21 ENCOUNTER — Ambulatory Visit: Payer: Medicare Other | Admitting: Family Medicine

## 2014-06-21 NOTE — Progress Notes (Signed)
Alerted by central telemetry at 0242  that pt was bradycardic/asystole. Entered room to find pt unresponsive and code blue called. Pt resuscitated  And intubated. Pt's POA Marta LamasJohn Adkins notified and informed staff that pt did not want to be on Ventilator. MD discussed with POA. Pt mechanically ventilated until POA at bedside then extubated. Time of death [id number].

## 2014-06-21 NOTE — Evaluation (Addendum)
Code Blue called on patient  @ 2:43 am (formal report pending) Was admitted for cellulitis/sepsis and ankle fracture on 8/3 Baseline hx/o afib, DM,HTN, morbid obesity  Went from persistent bradycardia to asystole. .  Was given epi x 3 and atropine x1  Bicarb x2  Intubated at bedside Pulse returned after approx 15 mins of CPR Upon returning of pulse, family was contacted about the incident.  Per the HCPOA, Kathleen [name] (pt's pastor), pt is DNI Discussed with HCPOA that pt was already intubated.  This was reconfirmed with HCPOA multiple times.  Per his wishes, we will continue with manual ventilation until he comes to pt's bedside emergently from home.

## 2014-06-21 NOTE — Consult Note (Signed)
[facility]  Department of Emergency Medicine   Code Blue CONSULT NOTE  Chief Complaint: Cardiac arrest/unresponsive   Level V Caveat: Unresponsive  History of present illness: I was contacted by the hospital for a CODE BLUE cardiac arrest upstairs and presented to the patient's bedside.   Pt admitted to the hospital due to cellulitis and sepsis - Code Blue event called and I responded from the ED.  The pt was reportedly doing well earlier in day, then had episode of bradycardia as reported by telemetry monitoring - the pt then decompensated quickly and went into asystole -  ROS: Unable to obtain, Level V caveat  Scheduled Meds: Continuous Infusions: PRN Meds:.   Past Medical History  Diagnosis Date  . HTN (hypertension)   . Hyperlipemia   . GERD (gastroesophageal reflux disease)   . Obesity   . Sleep apnea     she has not been on CPAP  . PE (pulmonary embolism) 05/2011  . DOE (dyspnea on exertion) 01/22/2012  . Hematuria 01/22/2012  . Diabetes mellitus without complication   . A-fib   . Neurogenic bladder   . CHF (congestive heart failure)   . Ulcer     both feet   Past Surgical History  Procedure Laterality Date  . Total abdominal hysterectomy w/ bilateral salpingoophorectomy    . Tonsillectomy    . Arm surgery Right     Broken and had rods inserted which later were removed.   History   Social History  . Marital Status: Widowed    Spouse Name: N/A    Number of Children: 0  . Years of Education: N/A   Occupational History  .      retired Education officer, environmentalmill worker   Social History Main Topics  . Smoking status: Never Smoker   . Smokeless tobacco: Never Used  . Alcohol Use: No  . Drug Use: No  . Sexual Activity: No   Other Topics Concern  . Not on file   Social History Narrative  . No narrative on file   Allergies  Allergen Reactions  . Gabapentin Other (See Comments)    Dizziness  . Lovenox [Enoxaparin Sodium] Itching    Itching, reddness     Last set of Vital Signs (not current) There were no vitals filed for this visit.    Physical Exam  Gen: unresponsive Cardiovascular: pulseless  Resp: apneic. Breath sounds equal decreased despite bagging  Abd: nondistended - morbidly obese Neuro: GCS 3, unresponsive to pain  HEENT: No blood in posterior pharynx, gag reflex absent  Neck: No crepitus  Musculoskeletal: No deformity  Skin: warm  Procedures  INTUBATION Performed by: Vida RollerMILLER,Chantale Leugers D Required items: required blood products, implants, devices, and special equipment available Patient identity confirmed: provided demographic data and hospital-assigned identification number Time out: Immediately prior to procedure a "time out" was called to verify the correct patient, procedure, equipment, support staff and site/side marked as required. Indications: apnea Intubation method: glidescope Preoxygenation: BVM Sedatives: none Paralytic: none Tube Size: 7.5 cuffed Post-procedure assessment: chest rise and ETCO2 monitor Breath sounds: equal and absent over the epigastrium Tube secured by Respiratory Therapy Patient tolerated the procedure well with no immediate complications.  Cardiopulmonary Resuscitation (CPR) Procedure Note  Directed/Performed by: Vida RollerMILLER,Nekhi Liwanag D I personally directed ancillary staff and/or performed CPR in an effort to regain return of spontaneous circulation and to maintain cardiac, neuro and systemic perfusion.   Assessment and Plan   on my arrival, the pt was found to  be pulseless in asystole - I ordered several different medications during the rescucitation including epinepherine, bicarbonate and calcium - the pt was very difficulty to ventillate due to her morbid obesity, small mouth and immobility - I intubated her with 7.5 ETT using the glidescope - at this time Dr. Alvester Morin arrived to take over care and continued rescusitative efforts.

## 2014-06-21 DEATH — deceased

## 2014-07-05 ENCOUNTER — Ambulatory Visit: Payer: Medicare Other | Admitting: Orthopedic Surgery

## 2014-07-26 ENCOUNTER — Ambulatory Visit: Payer: Medicare Other | Admitting: Internal Medicine

## 2014-10-23 IMAGING — CR DG WRIST COMPLETE 3+V*L*
3 series · 3 of 3 positions shown · non-contrast
Comparison: None.

CLINICAL DATA: Pain, no known injury

EXAM:
LEFT WRIST - COMPLETE 3+ VIEW

[view not recorded (1 of 3)]
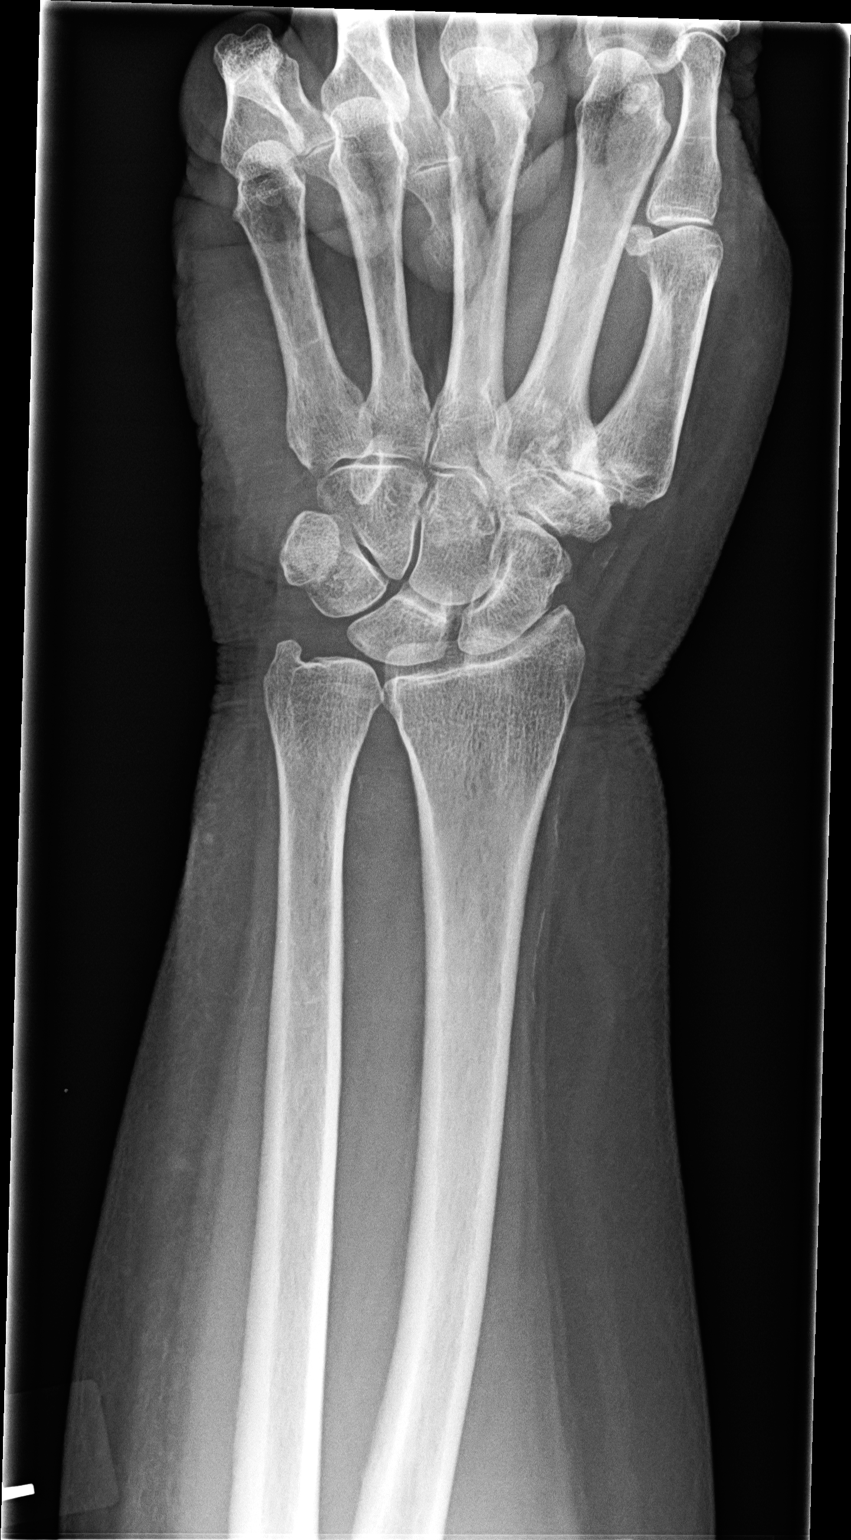

[view not recorded (2 of 3)]
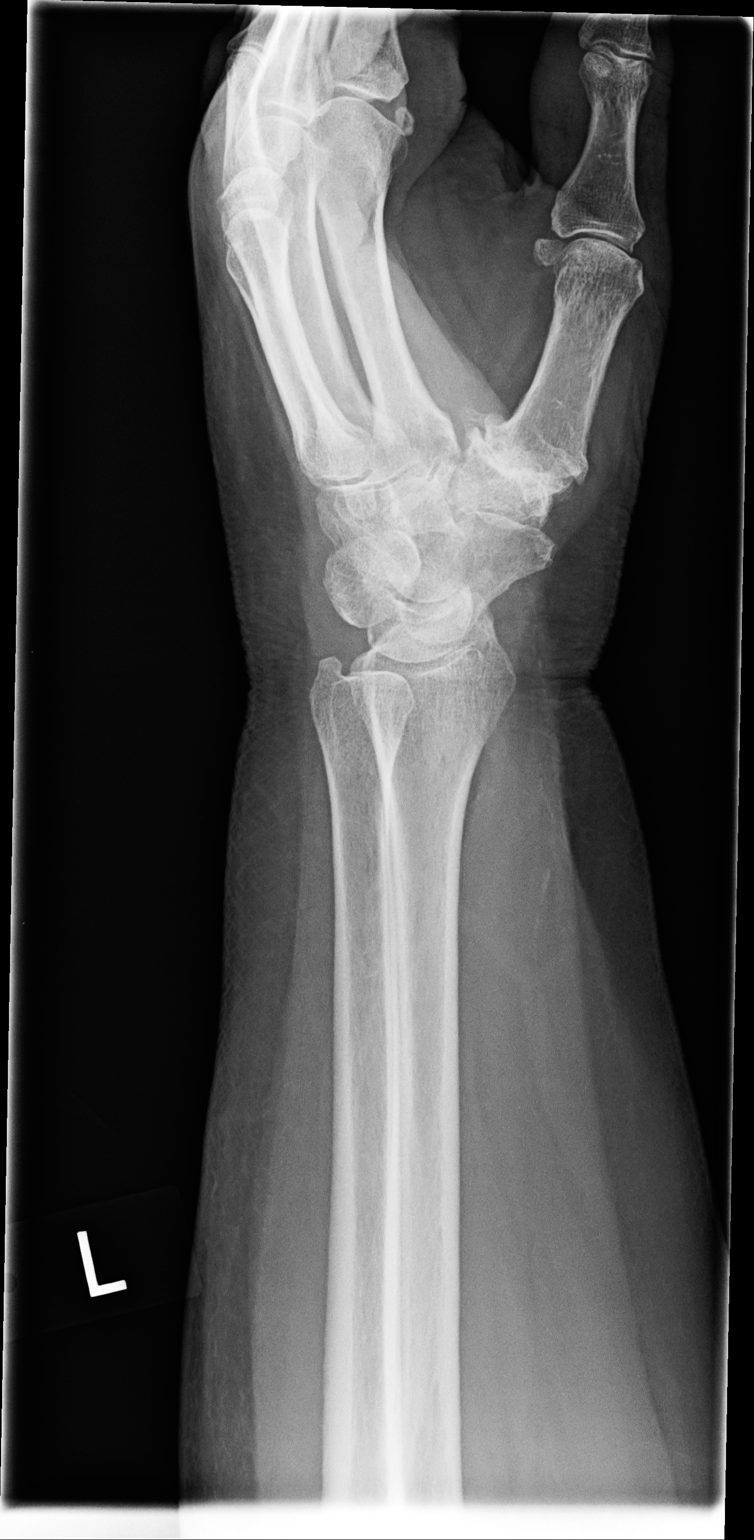

[view not recorded (3 of 3)]
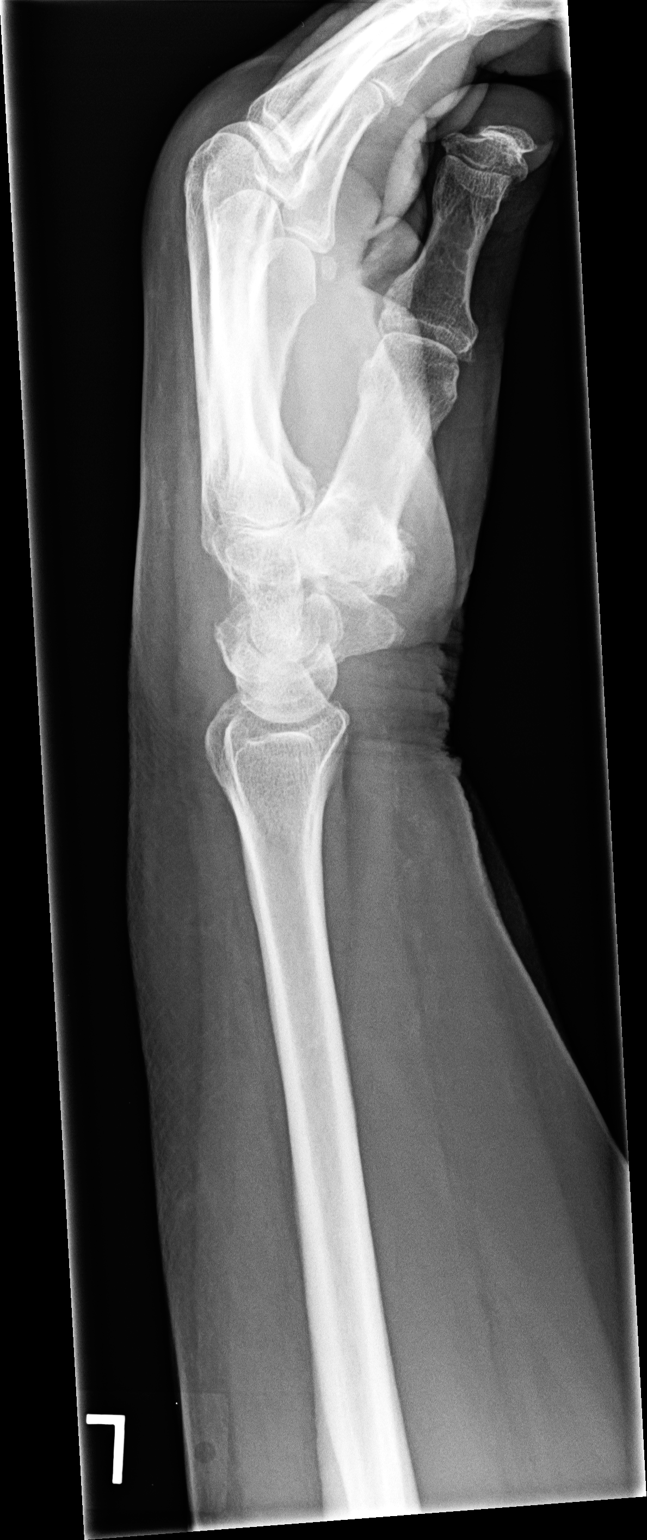

[3 of 3 positions shown; findings below may reference images not displayed]

FINDINGS: Three views of the left wrist submitted. No acute fracture or
subluxation. Mild narrowing of radiocarpal joint space. Significant
degenerative changes first carpometacarpal joint.
IMPRESSION: No acute fracture or subluxation. Mild narrowing of radiocarpal
joint space. Significant degenerative changes first carpometacarpal
joint.
